# Patient Record
Sex: Female | Born: 2000 | Race: Black or African American | Hispanic: No | Marital: Single | Smoking: Never smoker
Health system: Southern US, Community
[De-identification: ages and names within clinical notes are randomized; demographics above are authoritative.]

## PROBLEM LIST (undated history)

## (undated) DIAGNOSIS — I509 Heart failure, unspecified: Secondary | ICD-10-CM

## (undated) DIAGNOSIS — E0590 Thyrotoxicosis, unspecified without thyrotoxic crisis or storm: Secondary | ICD-10-CM

## (undated) DIAGNOSIS — R011 Cardiac murmur, unspecified: Secondary | ICD-10-CM

## (undated) DIAGNOSIS — Z227 Latent tuberculosis: Secondary | ICD-10-CM

## (undated) DIAGNOSIS — L309 Dermatitis, unspecified: Secondary | ICD-10-CM

## (undated) HISTORY — DX: Thyrotoxicosis, unspecified without thyrotoxic crisis or storm: E05.90

## (undated) HISTORY — DX: Heart failure, unspecified: I50.9

## (undated) HISTORY — PX: NO PAST SURGERIES: SHX2092

---
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
## 2019-11-02 ENCOUNTER — Ambulatory Visit (HOSPITAL_COMMUNITY)
Admission: EM | Admit: 2019-11-02 | Discharge: 2019-11-02 | Disposition: A | Discharge status: Home / Self Care | Payer: Self-pay | Attending: Urgent Care | Admitting: Urgent Care

## 2019-11-02 ENCOUNTER — Other Ambulatory Visit: Payer: Self-pay

## 2019-11-02 ENCOUNTER — Inpatient Hospital Stay (HOSPITAL_COMMUNITY)
Admission: AD | Admit: 2019-11-02 | Discharge: 2019-11-02 | Disposition: A | Discharge status: Home / Self Care | Payer: Commercial Managed Care - PPO | Attending: Obstetrics and Gynecology | Admitting: Obstetrics and Gynecology

## 2019-11-02 ENCOUNTER — Encounter (HOSPITAL_COMMUNITY): Payer: Self-pay

## 2019-11-02 DIAGNOSIS — Z3202 Encounter for pregnancy test, result negative: Secondary | ICD-10-CM | POA: Insufficient documentation

## 2019-11-02 DIAGNOSIS — R102 Pelvic and perineal pain: Secondary | ICD-10-CM

## 2019-11-02 DIAGNOSIS — N939 Abnormal uterine and vaginal bleeding, unspecified: Secondary | ICD-10-CM | POA: Insufficient documentation

## 2019-11-02 LAB — POCT PREGNANCY, URINE: Preg Test, Ur: NEGATIVE

## 2019-11-02 MED ORDER — MEGESTROL ACETATE 40 MG PO TABS: 80.0000 mg | ORAL_TABLET | Freq: Every day | ORAL | 0 refills | Status: DC

## 2019-11-02 MED ORDER — NAPROXEN 500 MG PO TABS: 500.0000 mg | ORAL_TABLET | Freq: Two times a day (BID) | ORAL | 0 refills | Status: DC

## 2019-11-02 NOTE — MAU Provider Note (Addendum)
[name] is a 19 y.o. No obstetric history on file. female who presents to MAU today with complaint of bleeding and cramping. The patient does not think she is pregnant. She reports calling her OB/GYN office today and was told to come here.   O BP 139/65 (BP Location: Right Arm)   Pulse 86   Temp 98.6 F (37 C) (Oral)   Resp 16   Ht 5\' 2"  (1.575 m)   Wt 83.7 kg   LMP 10/08/2019 Comment: started  when she expected period  SpO2 100%   BMI 33.76 kg/m  Physical Exam  Nursing note and vitals reviewed. Constitutional: She is oriented to person, place, and time. She appears well-developed and well-nourished. No distress.  HENT:  Head: Normocephalic and atraumatic.  Cardiovascular: Normal rate.  Respiratory: Effort normal. No respiratory distress.  Musculoskeletal:        General: Normal range of motion.     Cervical back: Normal range of motion.  Neurological: She is alert and oriented to person, place, and time.  Psychiatric: She has a normal mood and affect.   A Non pregnant female Medical screening exam complete  Results for orders placed or performed during the hospital encounter of 11/02/19 (from the past 24 hour(s))  Pregnancy, urine POC     Status: None   Collection Time: 11/02/19  3:39 PM  Result Value Ref Range   Preg Test, Ur NEGATIVE NEGATIVE   P Discharge from MAU in stable condition Patient given the option of transfer to [facility] for further evaluation or seek care in outpatient facility of choice List of options for follow-up given  Warning signs for worsening condition that would warrant emergency follow-up discussed Patient may return to MAU as needed for pregnancy related complaints  Julianne Handler, CNM 11/02/2019 3:50 PM

## 2019-11-02 NOTE — ED Provider Notes (Signed)
[place]   MRN: [id number] DOB: 2001-09-22  Subjective:   Alexis Conley is a 19 y.o. female presenting for 1 month history of persistent vaginal bleeding.  Patient states that she has a gynecologist and has been working with them on figuring out the right kind of birth control for her.  They are also planning on doing a pelvic and transvaginal ultrasound.  However, patient was worried about her ongoing bleeding and was advised to come in to be seen today.  No current facility-administered medications for this encounter.  Current Outpatient Medications:  .  cephALEXin (KEFLEX) 500 MG capsule, Take 1 capsule (500 mg total) by mouth 4 (four) times daily., Disp: 20 capsule, Rfl: 0 .  EPINEPHrine (EPI-PEN) 0.3 mg/0.3 mL DEVI, Inject 0.3 mg into the muscle daily as needed., Disp: , Rfl:  .  ibuprofen (ADVIL,MOTRIN) 600 MG tablet, Take 1 tablet (600 mg total) every 6 (six) hours as needed by mouth., Disp: 30 tablet, Rfl: 0 .  predniSONE (DELTASONE) 20 MG tablet, Two daily with food, Disp: 6 tablet, Rfl: 0 .  triamcinolone cream (KENALOG) 0.1 %, Apply 1 application topically 2 (two) times daily., Disp: 80 g, Rfl: 4   Allergies  Allergen Reactions  . Peanut-Containing Drug Products Anaphylaxis  . Shellfish Allergy Anaphylaxis  . Tomato Hives  . Mushroom Extract Complex     Childhood allergy.... Does not know reaction.     History reviewed. No pertinent past medical history.   History reviewed. No pertinent surgical history.  Family History  Problem Relation Age of Onset  . Healthy Mother   . Healthy Father     Social History   Tobacco Use  . Smoking status: Never Smoker  . Smokeless tobacco: Never Used  Substance Use Topics  . Alcohol use: No  . Drug use: No    ROS   Objective:   Vitals: BP 120/69 (BP Location: Left Arm)   Pulse 71   Temp 98.4 F (36.9 C) (Oral)   Resp 16   LMP 10/08/2019 Comment: started  when she expected period  SpO2 100%    Physical Exam Constitutional:      General: She is not in acute distress.    Appearance: Normal appearance. She is well-developed. She is not ill-appearing, toxic-appearing or diaphoretic.  HENT:     Head: Normocephalic and atraumatic.     Nose: Nose normal.     Mouth/Throat:     Mouth: Mucous membranes are moist.  Eyes:     Extraocular Movements: Extraocular movements intact.     Pupils: Pupils are equal, round, and reactive to light.  Cardiovascular:     Rate and Rhythm: Normal rate and regular rhythm.     Pulses: Normal pulses.     Heart sounds: Normal heart sounds. No murmur. No friction rub. No gallop.   Pulmonary:     Effort: Pulmonary effort is normal. No respiratory distress.     Breath sounds: Normal breath sounds. No stridor. No wheezing, rhonchi or rales.  Abdominal:     General: Bowel sounds are normal. There is no distension.     Palpations: Abdomen is soft. There is no mass.     Tenderness: There is abdominal tenderness (mild throughout). There is no guarding or rebound.  Skin:    General: Skin is warm and dry.     Findings: No rash.  Neurological:     Mental Status: She is alert and oriented to person, place, and time.  Psychiatric:  Mood and Affect: Mood normal.        Behavior: Behavior normal.        Thought Content: Thought content normal.        Judgment: Judgment normal.     Results for orders placed or performed during the hospital encounter of 11/02/19 (from the past 24 hour(s))  Pregnancy, urine POC     Status: None   Collection Time: 11/02/19  3:39 PM  Result Value Ref Range   Preg Test, Ur NEGATIVE NEGATIVE    Assessment and Plan :   1. Abnormal uterine bleeding   2. Vaginal bleeding   3. Pelvic pain     Will use Megace to help with her ongoing bleeding.  Emphasized need to maintain follow-up with her gynecologist to continue working on starting contraception, get her full work-up as they planned. Counseled patient on potential for  adverse effects with medications prescribed/recommended today, ER and return-to-clinic precautions discussed, patient verbalized understanding.    Jaynee Eagles, [place] 11/02/19 1702

## 2019-11-02 NOTE — ED Triage Notes (Signed)
Patient presents to Urgent Care with complaints of vaginal bleeding since a month ago. Patient reports she is not on birth control, is supposed to be getting started on some. Pt states she went to [facility] hospital but because she had a negative pregnancy test was told to come here.

## 2019-11-02 NOTE — MAU Note (Signed)
Has been bleeding for a month, cramping, just feeling weak, tired and light headed. Pt states is not pregnant.  Had called office and was told to come here.

## 2020-02-17 ENCOUNTER — Institutional Professional Consult (permissible substitution): Payer: Self-pay | Admitting: Plastic Surgery

## 2020-03-30 ENCOUNTER — Emergency Department (HOSPITAL_COMMUNITY)
Admission: EM | Admit: 2020-03-30 | Discharge: 2020-03-31 | Disposition: A | Discharge status: Home / Self Care | Payer: 59 | Attending: Emergency Medicine | Admitting: Emergency Medicine

## 2020-03-30 ENCOUNTER — Encounter (HOSPITAL_COMMUNITY): Payer: Self-pay | Admitting: Emergency Medicine

## 2020-03-30 DIAGNOSIS — R531 Weakness: Secondary | ICD-10-CM | POA: Insufficient documentation

## 2020-03-30 DIAGNOSIS — Z91010 Allergy to peanuts: Secondary | ICD-10-CM | POA: Diagnosis not present

## 2020-03-30 DIAGNOSIS — N939 Abnormal uterine and vaginal bleeding, unspecified: Secondary | ICD-10-CM | POA: Insufficient documentation

## 2020-03-30 DIAGNOSIS — Z79899 Other long term (current) drug therapy: Secondary | ICD-10-CM | POA: Insufficient documentation

## 2020-03-30 DIAGNOSIS — N926 Irregular menstruation, unspecified: Secondary | ICD-10-CM | POA: Diagnosis not present

## 2020-03-30 DIAGNOSIS — R42 Dizziness and giddiness: Secondary | ICD-10-CM | POA: Diagnosis not present

## 2020-03-30 LAB — COMPREHENSIVE METABOLIC PANEL
ALT: 12 U/L (ref 0–44)
AST: 17 U/L (ref 15–41)
Albumin: 3.6 g/dL (ref 3.5–5.0)
Alkaline Phosphatase: 85 U/L (ref 38–126)
Anion gap: 9 (ref 5–15)
BUN: 17 mg/dL (ref 6–20)
CO2: 22 mmol/L (ref 22–32)
Calcium: 9.3 mg/dL (ref 8.9–10.3)
Chloride: 107 mmol/L (ref 98–111)
Creatinine, Ser: 0.76 mg/dL (ref 0.44–1.00)
GFR calc Af Amer: >60 mL/min (ref >60)
GFR calc non Af Amer: >60 mL/min (ref >60)
Glucose, Bld: 92 mg/dL (ref 70–99)
Potassium: 4 mmol/L (ref 3.5–5.1)
Sodium: 138 mmol/L (ref 135–145)
Total Bilirubin: 0.3 mg/dL (ref 0.3–1.2)
Total Protein: 6.9 g/dL (ref 6.5–8.1)

## 2020-03-30 LAB — CBC WITH DIFFERENTIAL/PLATELET
Abs Immature Granulocytes: 0.01 10*3/uL (ref 0.00–0.07)
Basophils Absolute: 0 10*3/uL (ref 0.0–0.1)
Basophils Relative: 1 %
Eosinophils Absolute: 0.2 10*3/uL (ref 0.0–0.5)
Eosinophils Relative: 2 %
HCT: 37.3 % (ref 36.0–46.0)
Hemoglobin: 11.2 g/dL — ABNORMAL LOW (ref 12.0–15.0)
Immature Granulocytes: 0 %
Lymphocytes Relative: 34 %
Lymphs Abs: 2.8 10*3/uL (ref 0.7–4.0)
MCH: 22.1 pg — ABNORMAL LOW (ref 26.0–34.0)
MCHC: 30 g/dL (ref 30.0–36.0)
MCV: 73.6 fL — ABNORMAL LOW (ref 80.0–100.0)
Monocytes Absolute: 0.6 10*3/uL (ref 0.1–1.0)
Monocytes Relative: 7 %
Neutro Abs: 4.5 10*3/uL (ref 1.7–7.7)
Neutrophils Relative %: 56 %
Platelets: 330 10*3/uL (ref 150–400)
RBC: 5.07 MIL/uL (ref 3.87–5.11)
RDW: 19.3 % — ABNORMAL HIGH (ref 11.5–15.5)
WBC: 8 10*3/uL (ref 4.0–10.5)
nRBC: 0 % (ref 0.0–0.2)

## 2020-03-30 LAB — SAMPLE TO BLOOD BANK

## 2020-03-30 LAB — I-STAT BETA HCG BLOOD, ED (MC, WL, AP ONLY): I-stat hCG, quantitative: <5 m[IU]/mL (ref <5)

## 2020-03-30 NOTE — ED Triage Notes (Signed)
Pt states she is been bleeding for the past 2 weeks no controlled. She is changing her pads every 3 hours.

## 2020-03-31 LAB — URINALYSIS, ROUTINE W REFLEX MICROSCOPIC

## 2020-03-31 LAB — URINALYSIS, MICROSCOPIC (REFLEX): RBC / HPF: >50 RBC/hpf (ref 0–5)

## 2020-03-31 MED ORDER — MEGESTROL ACETATE 40 MG PO TABS
120.0000 mg | ORAL_TABLET | Freq: Once | ORAL | Status: DC
  Filled 2020-03-31: qty 3

## 2020-03-31 MED ORDER — NORGESTIMATE-ETH ESTRADIOL 0.25-35 MG-MCG PO TABS: 3.0000 | ORAL_TABLET | Freq: Every day | ORAL | 0 refills | Status: DC

## 2020-03-31 MED ORDER — NORGESTIMATE-ETH ESTRADIOL 0.25-35 MG-MCG PO TABS: 1.0000 | ORAL_TABLET | Freq: Every day | ORAL | 11 refills | Status: DC

## 2020-03-31 NOTE — ED Notes (Signed)
Patient verbalizes understanding of discharge instructions. Opportunity for questioning and answers were provided. Armband removed by staff, pt discharged from ED ambulatory to home.  

## 2020-03-31 NOTE — ED Provider Notes (Signed)
[facility] EMERGENCY DEPARTMENT Provider Note   CSN: [id number] Arrival date & time: 03/30/20  1756     History Chief Complaint  Patient presents with  . Vaginal Bleeding    Alexis Conley is a 19 y.o. female.   Vaginal Bleeding Quality:  Bright red and clots Severity:  Moderate Onset quality:  Gradual Duration:  2 weeks Timing:  Constant Progression:  Unchanged Chronicity:  Recurrent Menstrual history:  Irregular Number of pads used:  Q3h Possible pregnancy: no   Context: not after intercourse   Relieved by:  None tried Worsened by:  Nothing Ineffective treatments:  None tried      History reviewed. No pertinent past medical history.  There are no problems to display for this patient.   History reviewed. No pertinent surgical history.   OB History   No obstetric history on file.     Family History  Problem Relation Age of Onset  . Healthy Mother   . Healthy Father     Social History   Tobacco Use  . Smoking status: Never Smoker  . Smokeless tobacco: Never Used  Vaping Use  . Vaping Use: Never used  Substance Use Topics  . Alcohol use: No  . Drug use: No    Home Medications Prior to Admission medications   Medication Sig Start Date End Date Taking? Authorizing Provider  EPINEPHrine (EPI-PEN) 0.3 mg/0.3 mL DEVI Inject 0.3 mg into the muscle daily as needed (for allergy).    Yes [provider]  megestrol (MEGACE) 40 MG tablet Take 2 tablets (80 mg total) by mouth daily. Patient not taking: Reported on 03/31/2020 11/02/19   Jaynee Eagles, PA-C  naproxen (NAPROSYN) 500 MG tablet Take 1 tablet (500 mg total) by mouth 2 (two) times daily with a meal. Patient not taking: Reported on 03/31/2020 11/02/19   Jaynee Eagles, PA-C  norgestimate-ethinyl estradiol ([place] 28) 0.25-35 MG-MCG tablet Take 3 tablets by mouth daily for 7 days. 03/31/20 04/07/20  Jonah Nestle, Corene Cornea, MD  norgestimate-ethinyl estradiol ([place] 28) 0.25-35 MG-MCG tablet  Take 1 tablet by mouth daily. 03/31/20   Gretel Cantu, Corene Cornea, MD    Allergies    Peanut-containing drug products, Shellfish allergy, Tomato, and Mushroom extract complex  Review of Systems   Review of Systems  Genitourinary: Positive for vaginal bleeding.  All other systems reviewed and are negative.   Physical Exam Updated Vital Signs BP (!) 144/77 (BP Location: Right Arm)   Pulse 90   Temp 98 F (36.7 C) (Oral)   Resp 16   Ht 5\' 2"  (1.575 m)   Wt 79.4 kg   SpO2 100%   BMI 32.01 kg/m   Physical Exam Vitals and nursing note reviewed.  Constitutional:      Appearance: She is well-developed.  HENT:     Head: Normocephalic and atraumatic.     Nose: No congestion or rhinorrhea.     Mouth/Throat:     Mouth: Mucous membranes are moist.     Pharynx: Oropharynx is clear.  Eyes:     Pupils: Pupils are equal, round, and reactive to light.  Cardiovascular:     Rate and Rhythm: Normal rate and regular rhythm.  Pulmonary:     Effort: No respiratory distress.     Breath sounds: No stridor.  Abdominal:     General: There is no distension.  Musculoskeletal:     Cervical back: Normal range of motion.  Skin:    General: Skin is warm and dry.  Neurological:     General: No focal deficit present.     Mental Status: She is alert and oriented to person, place, and time.     ED Results / Procedures / Treatments   Labs (all labs ordered are listed, but only abnormal results are displayed) Labs Reviewed  CBC WITH DIFFERENTIAL/PLATELET - Abnormal; Notable for the following components:      Result Value   Hemoglobin 11.2 (*)    MCV 73.6 (*)    MCH 22.1 (*)    RDW 19.3 (*)    All other components within normal limits  COMPREHENSIVE METABOLIC PANEL  URINALYSIS, ROUTINE W REFLEX MICROSCOPIC  I-STAT BETA HCG BLOOD, ED ([facility], [facility], [facility] ONLY)  SAMPLE TO BLOOD BANK    EKG None  Radiology No results found.  Procedures Procedures (including critical care time)  Medications Ordered in  ED Medications  megestrol (MEGACE) tablet 120 mg (has no administration in time range)    ED Course  I have reviewed the triage vital signs and the nursing notes.  Pertinent labs & imaging results that were available during my care of the patient were reviewed by me and considered in my medical decision making (see chart for details).    MDM Rules/Calculators/A&P                          History of abnormal uterine bleeding secondary to endometrial thickness.  Patient is scheduled to repeat ultrasound here in a month or 2 and follow-up with her gynecologist for the same however she had a little bit of lightheadedness and weakness with 2 weeks of bleeding so she was concerned that her hemoglobin might be low.  Hemoglobin is within normal limits.  Her vital signs are within normal limits.  Low suspicion for acute blood loss anemia.  After long discussion with the patient will defer ultrasound or pelvic exam at this time as we know the source of bleeding.  She is not sexually active.  We will try to control with oral medications again prior to follow-up with gynecologist per patient request.  Final Clinical Impression(s) / ED Diagnoses Final diagnoses:  Vaginal bleeding    Rx / DC Orders ED Discharge Orders         Ordered    norgestimate-ethinyl estradiol ([place] 28) 0.25-35 MG-MCG tablet  Daily     Discontinue  Reprint     03/31/20 0254    norgestimate-ethinyl estradiol ([place] 28) 0.25-35 MG-MCG tablet  Daily     Discontinue  Reprint     03/31/20 0255           Adraine Biffle, Corene Cornea, MD 03/31/20 0973

## 2020-03-31 NOTE — Discharge Instructions (Signed)
Take 3 tabs of sprintec daily, take one week off then take as directed on package

## 2020-05-02 ENCOUNTER — Other Ambulatory Visit: Payer: Self-pay | Admitting: Obstetrics and Gynecology

## 2020-05-02 ENCOUNTER — Ambulatory Visit
Admission: RE | Admit: 2020-05-02 | Discharge: 2020-05-02 | Disposition: A | Discharge status: Home / Self Care | Payer: No Typology Code available for payment source | Source: Ambulatory Visit | Attending: Obstetrics and Gynecology | Admitting: Obstetrics and Gynecology

## 2020-05-02 DIAGNOSIS — R7611 Nonspecific reaction to tuberculin skin test without active tuberculosis: Secondary | ICD-10-CM

## 2020-06-14 ENCOUNTER — Ambulatory Visit (HOSPITAL_COMMUNITY)
Admission: EM | Admit: 2020-06-14 | Discharge: 2020-06-14 | Disposition: A | Discharge status: Home / Self Care | Payer: 59 | Attending: Emergency Medicine | Admitting: Emergency Medicine

## 2020-06-14 ENCOUNTER — Other Ambulatory Visit: Payer: Self-pay

## 2020-06-14 ENCOUNTER — Encounter (HOSPITAL_COMMUNITY): Payer: Self-pay | Admitting: *Deleted

## 2020-06-14 DIAGNOSIS — R05 Cough: Secondary | ICD-10-CM | POA: Diagnosis present

## 2020-06-14 DIAGNOSIS — R059 Cough, unspecified: Secondary | ICD-10-CM

## 2020-06-14 DIAGNOSIS — R197 Diarrhea, unspecified: Secondary | ICD-10-CM | POA: Diagnosis present

## 2020-06-14 DIAGNOSIS — Z79899 Other long term (current) drug therapy: Secondary | ICD-10-CM | POA: Diagnosis not present

## 2020-06-14 DIAGNOSIS — Z20822 Contact with and (suspected) exposure to covid-19: Secondary | ICD-10-CM | POA: Diagnosis not present

## 2020-06-14 HISTORY — DX: Latent tuberculosis: Z22.7

## 2020-06-14 LAB — SARS CORONAVIRUS 2 (TAT 6-24 HRS): SARS Coronavirus 2: NEGATIVE

## 2020-06-14 MED ORDER — ONDANSETRON 4 MG PO TBDP
4.0000 mg | ORAL_TABLET | Freq: Three times a day (TID) | ORAL | 0 refills | Status: DC | PRN
Start: 2020-06-14 — End: 2020-08-11

## 2020-06-14 MED ORDER — BENZONATATE 100 MG PO CAPS
100.0000 mg | ORAL_CAPSULE | Freq: Three times a day (TID) | ORAL | 0 refills | Status: DC
Start: 2020-06-14 — End: 2020-08-11

## 2020-06-14 NOTE — Discharge Instructions (Signed)
Small frequent sips of fluids- Pedialyte, Gatorade, water, broth- to maintain hydration.   Zofran every 8 hours as needed for nausea or vomiting.   Bland diet as tolerated.  Tessalon as needed for cough.  Self isolate until covid results are back and negative.  Will notify you by phone of any positive findings. Your negative results will be sent through your MyChart.     If symptoms worsen or do not improve in the next week to return to be seen or to follow up with your PCP.

## 2020-06-14 NOTE — ED Triage Notes (Signed)
Patient works at [employer] and states that many children have been getting RSV and croup.

## 2020-06-14 NOTE — ED Triage Notes (Signed)
Patient in with complaints of cough started today and nausea and diarrhea since Friday. Patient states that she has experienced 3 episodes of diarrhea/day. Patient denies abdominal pain and fever. Patient states she has experienced chills. Patient also mentioned that she is being treated for latent TB currently.

## 2020-06-14 NOTE — ED Provider Notes (Signed)
[facility]    CSN: [id number] Arrival date & time: 06/14/20  1010      History   Chief Complaint Chief Complaint  Patient presents with  . Cough  . Diarrhea    HPI Alexis Conley is a 19 y.o. female.   Alexis Conley presents with complaints of cough. Cough causes chest pain . Diarrhea for the past 5 days. Nausea, no vomiting. Cough started today. No fever. No runny nose. Some sore throat. No headache or body aches. No abdominal pain. Approximately 3 episodes of diarrhea a day, took over the counter medication for diarrhea yesterday which has helped, no further episodes today. Works in child care. Her nephew and her mother had a "stomach virus" three weeks ago. There has been RSV and croup where she works. No history of covid-19 and has not received vaccination. 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 She is on rifampin, started this yesterday. No shortness of breath.     ROS per HPI, negative if not otherwise mentioned.      Past Medical History:  Diagnosis Date  . TB lung, latent     There are no problems to display for this patient.   History reviewed. No pertinent surgical history.  OB History   No obstetric history on file.      Home Medications    Prior to Admission medications   Medication Sig Start Date End Date Taking? Authorizing Provider  rifampin (RIFADIN) 300 MG capsule Take 600 mg by mouth.   Yes [provider]  benzonatate (TESSALON) 100 MG capsule Take 1 capsule (100 mg total) by mouth every 8 (eight) hours. 06/14/20   Zigmund Gottron, NP  EPINEPHrine (EPI-PEN) 0.3 mg/0.3 mL DEVI Inject 0.3 mg into the muscle daily as needed (for allergy).     [provider]  megestrol (MEGACE) 40 MG tablet Take 2 tablets (80 mg total) by mouth daily. Patient not taking: Reported on 03/31/2020 11/02/19   Jaynee Eagles, PA-C  naproxen (NAPROSYN) 500 MG tablet Take 1 tablet (500 mg total) by mouth  2 (two) times daily with a meal. Patient not taking: Reported on 03/31/2020 11/02/19   Jaynee Eagles, PA-C  norgestimate-ethinyl estradiol ([place] 28) 0.25-35 MG-MCG tablet Take 3 tablets by mouth daily for 7 days. 03/31/20 04/07/20  Mesner, Corene Cornea, MD  norgestimate-ethinyl estradiol ([place] 28) 0.25-35 MG-MCG tablet Take 1 tablet by mouth daily. 03/31/20   Mesner, Corene Cornea, MD  ondansetron (ZOFRAN-ODT) 4 MG disintegrating tablet Take 1 tablet (4 mg total) by mouth every 8 (eight) hours as needed for nausea or vomiting. 06/14/20   Zigmund Gottron, NP    Family History Family History  Problem Relation Age of Onset  . Healthy Mother   . Healthy Father     Social History Social History   Tobacco Use  . Smoking status: Never Smoker  . Smokeless tobacco: Never Used  Vaping Use  . Vaping Use: Never used  Substance Use Topics  . Alcohol use: No  . Drug use: No     Allergies   Peanut-containing drug products, Shellfish allergy, Tomato, and Mushroom extract complex   Review of Systems Review of Systems   Physical Exam Triage Vital Signs ED Triage Vitals  Enc Vitals Group     BP 06/14/20 1158 (!) 121/59     Pulse Rate 06/14/20 1158 76     Resp 06/14/20 1158 20     Temp  06/14/20 1158 98.8 F (37.1 C)     Temp Source 06/14/20 1158 Oral     SpO2 06/14/20 1158 100 %     Weight --      Height --      Head Circumference --      Peak Flow --      Pain Score 06/14/20 1201 0     Pain Loc --      Pain Edu? --      Excl. in [place]? --    No data found.  Updated Vital Signs BP (!) 121/59 (BP Location: Left Arm)   Pulse 76   Temp 98.8 F (37.1 C) (Oral)   Resp 20   LMP 06/09/2020   SpO2 100%   Visual Acuity Right Eye Distance:   Left Eye Distance:   Bilateral Distance:    Right Eye Near:   Left Eye Near:    Bilateral Near:     Physical Exam Constitutional:      General: She is not in acute distress.    Appearance: She is well-developed.  Cardiovascular:     Rate and Rhythm:  Normal rate.  Pulmonary:     Effort: Pulmonary effort is normal.  Abdominal:     Tenderness: There is no abdominal tenderness.  Skin:    General: Skin is warm and dry.  Neurological:     Mental Status: She is alert and oriented to person, place, and time.      UC Treatments / Results  Labs (all labs ordered are listed, but only abnormal results are displayed) Labs Reviewed  SARS CORONAVIRUS 2 (TAT 6-24 HRS)    EKG   Radiology No results found.  Procedures Procedures (including critical care time)  Medications Ordered in UC Medications - No data to display  Initial Impression / Assessment and Plan / UC Course  I have reviewed the triage vital signs and the nursing notes.  Pertinent labs & imaging results that were available during my care of the patient were reviewed by me and considered in my medical decision making (see chart for details).     Non toxic. Benign physical exam.  No work of breathing. One day of cough. Chest xray deferred today. Covid testing pending and isolation instructions provided.  Supportive cares recommended. Diarrhea improving, taking po. Return precautions provided. Patient verbalized understanding and agreeable to plan.   Final Clinical Impressions(s) / UC Diagnoses   Final diagnoses:  Cough  Diarrhea, unspecified type     Discharge Instructions     Small frequent sips of fluids- Pedialyte, Gatorade, water, broth- to maintain hydration.   Zofran every 8 hours as needed for nausea or vomiting.   Bland diet as tolerated.  Tessalon as needed for cough.  Self isolate until covid results are back and negative.  Will notify you by phone of any positive findings. Your negative results will be sent through your MyChart.     If symptoms worsen or do not improve in the next week to return to be seen or to follow up with your PCP.      ED Prescriptions    Medication Sig Dispense Auth. Provider   benzonatate (TESSALON) 100 MG capsule Take 1  capsule (100 mg total) by mouth every 8 (eight) hours. 21 capsule Jisella Ashenfelter B, NP   ondansetron (ZOFRAN-ODT) 4 MG disintegrating tablet Take 1 tablet (4 mg total) by mouth every 8 (eight) hours as needed for nausea or vomiting. 12 tablet Augusto Gamble B,  NP     PDMP not reviewed this encounter.   Zigmund Gottron, NP 06/14/20 1936

## 2020-08-11 ENCOUNTER — Ambulatory Visit
Admission: EM | Admit: 2020-08-11 | Discharge: 2020-08-11 | Disposition: A | Discharge status: Home / Self Care | Payer: 59 | Attending: Physician Assistant | Admitting: Physician Assistant

## 2020-08-11 DIAGNOSIS — J029 Acute pharyngitis, unspecified: Secondary | ICD-10-CM

## 2020-08-11 DIAGNOSIS — R059 Cough, unspecified: Secondary | ICD-10-CM | POA: Diagnosis not present

## 2020-08-11 DIAGNOSIS — M940 Chondrocostal junction syndrome [Tietze]: Secondary | ICD-10-CM

## 2020-08-11 MED ORDER — BENZONATATE 200 MG PO CAPS
200.0000 mg | ORAL_CAPSULE | Freq: Three times a day (TID) | ORAL | 0 refills | Status: DC
Start: 2020-08-11 — End: 2020-10-05

## 2020-08-11 NOTE — Discharge Instructions (Signed)
COVID PCR testing ordered. I would like you to quarantine until testing results. Tessalon for cough. You can take over the counter flonase/nasacort to help with nasal congestion/drainage. Tylenol/motrin for pain and fever. Keep hydrated, urine should be clear to pale yellow in color. If experiencing shortness of breath, trouble breathing, go to the emergency department for further evaluation needed.

## 2020-08-11 NOTE — ED Provider Notes (Signed)
[facility]    CSN: [id number] Arrival date & time: 08/11/20  1519      History   Chief Complaint Chief Complaint  Patient presents with  . Cough    HPI Alexis Conley is a 19 y.o. female.   19 year old female comes in for 2 day of URI symptoms. Cough, sore throat, congestion. Now with central chest pain. Denies fever, chills, body aches. Denies abdominal pain, nausea, vomiting, diarrhea. Denies shortness of breath, loss of taste/smell.   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 Started on rifampin, but self discontinued due to side effects. Denies night sweats, weight loss, hemoptysis.      Past Medical History:  Diagnosis Date  . TB lung, latent     There are no problems to display for this patient.   History reviewed. No pertinent surgical history.  OB History   No obstetric history on file.      Home Medications    Prior to Admission medications   Medication Sig Start Date End Date Taking? Authorizing Provider  rifampin (RIFADIN) 300 MG capsule Take 600 mg by mouth.   Yes [provider]  benzonatate (TESSALON) 200 MG capsule Take 1 capsule (200 mg total) by mouth every 8 (eight) hours. 08/11/20   Tasia Catchings, Luisfernando Brightwell V, PA-C  EPINEPHrine (EPI-PEN) 0.3 mg/0.3 mL DEVI Inject 0.3 mg into the muscle daily as needed (for allergy).     [provider]  norgestimate-ethinyl estradiol ([place] 28) 0.25-35 MG-MCG tablet Take 1 tablet by mouth daily. 03/31/20 08/11/20  Mesner, Corene Cornea, MD    Family History Family History  Problem Relation Age of Onset  . Healthy Mother   . Healthy Father     Social History Social History   Tobacco Use  . Smoking status: Never Smoker  . Smokeless tobacco: Never Used  Vaping Use  . Vaping Use: Never used  Substance Use Topics  . Alcohol use: No  . Drug use: No     Allergies   Peanut-containing drug products, Shellfish allergy, Tomato, and Mushroom extract complex   Review of Systems Review of Systems    Reason unable to perform ROS: See HPI as above.     Physical Exam Triage Vital Signs ED Triage Vitals  Enc Vitals Group     BP 08/11/20 1526 120/82     Pulse Rate 08/11/20 1526 86     Resp 08/11/20 1526 20     Temp 08/11/20 1526 98.4 F (36.9 C)     Temp Source 08/11/20 1526 Oral     SpO2 08/11/20 1526 99 %     Weight --      Height --      Head Circumference --      Peak Flow --      Pain Score 08/11/20 1533 4     Pain Loc --      Pain Edu? --      Excl. in [place]? --    No data found.  Updated Vital Signs BP 120/82 (BP Location: Left Arm)   Pulse 86   Temp 98.4 F (36.9 C) (Oral)   Resp 20   LMP 07/07/2020   SpO2 99%   Physical Exam Constitutional:      General: She is not in acute distress.    Appearance: Normal appearance. She is well-developed. She is not ill-appearing, toxic-appearing or diaphoretic.  HENT:     Head: Normocephalic and atraumatic.     Right Ear: Tympanic membrane,  ear canal and external ear normal. Tympanic membrane is not erythematous or bulging.     Left Ear: Tympanic membrane, ear canal and external ear normal. Tympanic membrane is not erythematous or bulging.     Nose:     Right Sinus: No maxillary sinus tenderness or frontal sinus tenderness.     Left Sinus: No maxillary sinus tenderness or frontal sinus tenderness.     Mouth/Throat:     Mouth: Mucous membranes are moist.     Pharynx: Oropharynx is clear. Uvula midline.  Eyes:     Conjunctiva/sclera: Conjunctivae normal.     Pupils: Pupils are equal, round, and reactive to light.  Cardiovascular:     Rate and Rhythm: Normal rate and regular rhythm.  Pulmonary:     Effort: Pulmonary effort is normal. No accessory muscle usage, prolonged expiration, respiratory distress or retractions.     Breath sounds: No decreased air movement or transmitted upper airway sounds. No decreased breath sounds.     Comments: LCTAB Chest:     Comments: Chest wall tenderness to sternum  Musculoskeletal:      Cervical back: Normal range of motion and neck supple.  Skin:    General: Skin is warm and dry.  Neurological:     Mental Status: She is alert and oriented to person, place, and time.      UC Treatments / Results  Labs (all labs ordered are listed, but only abnormal results are displayed) Labs Reviewed  NOVEL CORONAVIRUS, NAA    EKG   Radiology No results found.  Procedures Procedures (including critical care time)  Medications Ordered in UC Medications - No data to display  Initial Impression / Assessment and Plan / UC Course  I have reviewed the triage vital signs and the nursing notes.  Pertinent labs & imaging results that were available during my care of the patient were reviewed by me and considered in my medical decision making (see chart for details).    COVID PCR test ordered. Patient to quarantine until testing results return. No alarming signs on exam. LCTAB. Symptomatic treatment discussed.  Push fluids.  Return precautions given.  Patient expresses understanding and agrees to plan.  Final Clinical Impressions(s) / UC Diagnoses   Final diagnoses:  Cough  Costochondritis  Sore throat   ED Prescriptions    Medication Sig Dispense Auth. Provider   benzonatate (TESSALON) 200 MG capsule Take 1 capsule (200 mg total) by mouth every 8 (eight) hours. 21 capsule Ok Edwards, PA-C     PDMP not reviewed this encounter.   Ok Edwards, PA-C 08/11/20 1554

## 2020-08-11 NOTE — ED Triage Notes (Signed)
Pt c/o cough, sore throat, and chest congestion x2 days. States hx of latent TB and has not been taking her meds for a month.

## 2020-08-12 LAB — NOVEL CORONAVIRUS, NAA: SARS-CoV-2, NAA: NOT DETECTED

## 2020-08-12 LAB — SARS-COV-2, NAA 2 DAY TAT

## 2020-10-05 ENCOUNTER — Other Ambulatory Visit: Payer: Self-pay

## 2020-10-05 ENCOUNTER — Ambulatory Visit
Admission: EM | Admit: 2020-10-05 | Discharge: 2020-10-05 | Disposition: A | Discharge status: Home / Self Care | Payer: 59 | Attending: Emergency Medicine | Admitting: Emergency Medicine

## 2020-10-05 DIAGNOSIS — R112 Nausea with vomiting, unspecified: Secondary | ICD-10-CM | POA: Diagnosis not present

## 2020-10-05 DIAGNOSIS — B349 Viral infection, unspecified: Secondary | ICD-10-CM

## 2020-10-05 DIAGNOSIS — R52 Pain, unspecified: Secondary | ICD-10-CM | POA: Diagnosis not present

## 2020-10-05 DIAGNOSIS — Z1152 Encounter for screening for COVID-19: Secondary | ICD-10-CM | POA: Diagnosis not present

## 2020-10-05 LAB — POCT URINALYSIS DIP (MANUAL ENTRY)
Bilirubin, UA: NEGATIVE
Blood, UA: NEGATIVE
Glucose, UA: NEGATIVE mg/dL
Nitrite, UA: NEGATIVE
Protein Ur, POC: 30 mg/dL — AB
Spec Grav, UA: >=1.03 — AB (ref 1.010–1.025)
Urobilinogen, UA: 0.2 E.U./dL
pH, UA: 5.5 (ref 5.0–8.0)

## 2020-10-05 LAB — POCT URINE PREGNANCY: Preg Test, Ur: NEGATIVE

## 2020-10-05 MED ORDER — IBUPROFEN 800 MG PO TABS: 800.0000 mg | ORAL_TABLET | Freq: Three times a day (TID) | ORAL | 0 refills | Status: DC

## 2020-10-05 MED ORDER — ONDANSETRON 4 MG PO TBDP
4.0000 mg | ORAL_TABLET | Freq: Three times a day (TID) | ORAL | 0 refills | Status: DC | PRN
Start: 2020-10-05 — End: 2021-02-22

## 2020-10-05 MED ORDER — FLUTICASONE PROPIONATE 50 MCG/ACT NA SUSP: 1.0000 | Freq: Every day | NASAL | 0 refills | Status: DC

## 2020-10-05 NOTE — ED Triage Notes (Signed)
Patient states she awoke at 0100 hours this am and was having generalized body aches and chills as well as nausea but has not has any emesis episodes. Pt states she also developed a headache this morning. Pt is aox4 and ambulatory.

## 2020-10-05 NOTE — Discharge Instructions (Addendum)
Pregnancy test negative COVID test pending Zofran as needed for nausea Tylenol ibuprofen for fever body aches chills, ear pain Flonase daily to help with any fluid on ears Rest and fluids Follow-up with your improving or worsening

## 2020-10-05 NOTE — ED Provider Notes (Signed)
[facility]    CSN: [id number] Arrival date & time: 10/05/20  1855      History   Chief Complaint Chief Complaint  Patient presents with  . Generalized Body Aches    Since 0100  . Nausea    Since 0100  . Headache    Today    HPI Alexis Conley is a 20 y.o. female presenting today for evaluation of ear pain and body aches.  Patient reports over the the past 4 days has had intermittent discomfort in her right ear.  Will have short brief sharp pains.  Denies any significant associated URI symptoms of cough congestion or sore throat.  Over the past 24 hours has developed low-grade fevers body aches and chills with associated nausea.  Denies vomiting.  Has urge to defecate, but has not been able to.  Denies any urinary symptoms.  Reports last menstrual cycle was early December, typically will have irregular cycles.  Reports questionable pregnancy test at home.  Denies any significant abdominal pain.  Reports stomach bug going around work.  HPI  Past Medical History:  Diagnosis Date  . TB lung, latent     There are no problems to display for this patient.   History reviewed. No pertinent surgical history.  OB History   No obstetric history on file.      Home Medications    Prior to Admission medications   Medication Sig Start Date End Date Taking? Authorizing Provider  fluticasone (FLONASE) 50 MCG/ACT nasal spray Place 1-2 sprays into both nostrils daily. 10/05/20  Yes Wieters, Hallie C, PA-C  ibuprofen (ADVIL) 800 MG tablet Take 1 tablet (800 mg total) by mouth 3 (three) times daily. 10/05/20  Yes Wieters, Hallie C, PA-C  ondansetron (ZOFRAN ODT) 4 MG disintegrating tablet Take 1 tablet (4 mg total) by mouth every 8 (eight) hours as needed for nausea or vomiting. 10/05/20  Yes Wieters, Hallie C, PA-C  EPINEPHrine (EPI-PEN) 0.3 mg/0.3 mL DEVI Inject 0.3 mg into the muscle daily as needed (for allergy).     [provider]  rifampin (RIFADIN) 300 MG  capsule Take 600 mg by mouth.    [provider]  norgestimate-ethinyl estradiol (Flowella 28) 0.25-35 MG-MCG tablet Take 1 tablet by mouth daily. 03/31/20 08/11/20  Mesner, Corene Cornea, MD    Family History Family History  Problem Relation Age of Onset  . Healthy Mother   . Healthy Father     Social History Social History   Tobacco Use  . Smoking status: Never Smoker  . Smokeless tobacco: Never Used  Vaping Use  . Vaping Use: Never used  Substance Use Topics  . Alcohol use: No  . Drug use: No     Allergies   Peanut-containing drug products, Shellfish allergy, Tomato, and Mushroom extract complex   Review of Systems Review of Systems  Constitutional: Positive for chills, fatigue and fever. Negative for activity change and appetite change.  HENT: Negative for congestion, ear pain, rhinorrhea, sinus pressure, sore throat and trouble swallowing.   Eyes: Negative for discharge and redness.  Respiratory: Negative for cough, chest tightness and shortness of breath.   Cardiovascular: Negative for chest pain.  Gastrointestinal: Positive for nausea. Negative for abdominal pain, diarrhea and vomiting.  Musculoskeletal: Positive for myalgias.  Skin: Negative for rash.  Neurological: Negative for dizziness, light-headedness and headaches.     Physical Exam Triage Vital Signs ED Triage Vitals  Enc Vitals Group     BP 10/05/20 1914 116/79  Pulse Rate 10/05/20 1914 (!) 105     Resp 10/05/20 1914 20     Temp 10/05/20 1914 99.7 F (37.6 C)     Temp Source 10/05/20 1914 Oral     SpO2 10/05/20 1914 99 %     Weight --      Height --      Head Circumference --      Peak Flow --      Pain Score 10/05/20 1929 8     Pain Loc --      Pain Edu? --      Excl. in [place]? --    No data found.  Updated Vital Signs BP 116/79 (BP Location: Left Arm)   Pulse (!) 105   Temp 99.7 F (37.6 C) (Oral)   Resp 20   LMP 08/18/2020   SpO2 99%   Visual Acuity Right Eye Distance:    Left Eye Distance:   Bilateral Distance:    Right Eye Near:   Left Eye Near:    Bilateral Near:     Physical Exam Vitals and nursing note reviewed.  Constitutional:      Appearance: She is well-developed and well-nourished.     Comments: No acute distress  HENT:     Head: Normocephalic and atraumatic.     Ears:     Comments: Bilateral ears without tenderness to palpation of external auricle, tragus and mastoid, EAC's without erythema or swelling, TM's with good bony landmarks and cone of light. Non erythematous.     Nose: Nose normal.     Mouth/Throat:     Comments: Oral mucosa pink and moist, no tonsillar enlargement or exudate. Posterior pharynx patent and nonerythematous, no uvula deviation or swelling. Normal phonation. Eyes:     Conjunctiva/sclera: Conjunctivae normal.  Cardiovascular:     Rate and Rhythm: Normal rate.  Pulmonary:     Effort: Pulmonary effort is normal. No respiratory distress.     Comments: Breathing comfortably at rest, CTABL, no wheezing, rales or other adventitious sounds auscultated Abdominal:     General: There is no distension.  Musculoskeletal:        General: Normal range of motion.     Cervical back: Neck supple.  Skin:    General: Skin is warm and dry.  Neurological:     Mental Status: She is alert and oriented to person, place, and time.  Psychiatric:        Mood and Affect: Mood and affect normal.      UC Treatments / Results  Labs (all labs ordered are listed, but only abnormal results are displayed) Labs Reviewed  POCT URINALYSIS DIP (MANUAL ENTRY) - Abnormal; Notable for the following components:      Result Value   Clarity, UA hazy (*)    Ketones, POC UA small (15) (*)    Spec Grav, UA >=1.030 (*)    Protein Ur, POC =30 (*)    Leukocytes, UA Trace (*)    All other components within normal limits  NOVEL CORONAVIRUS, NAA  POCT URINE PREGNANCY    EKG   Radiology No results found.  Procedures Procedures (including  critical care time)  Medications Ordered in UC Medications - No data to display  Initial Impression / Assessment and Plan / UC Course  I have reviewed the triage vital signs and the nursing notes.  Pertinent labs & imaging results that were available during my care of the patient were reviewed by me and considered in my  medical decision making (see chart for details).     Pregnancy test negative, COVID test pending, treating for viral etiology of nausea chills and body aches, no signs of ear infection, possible underlying eustachian tube dysfunction..  Continue symptomatic and supportive care rest and fluids, monitor symptoms.  Discussed strict return precautions. Patient verbalized understanding and is agreeable with plan.  Final Clinical Impressions(s) / UC Diagnoses   Final diagnoses:  Encounter for screening for COVID-19  Viral illness  Non-intractable vomiting with nausea, unspecified vomiting type  Body aches     Discharge Instructions     Pregnancy test negative COVID test pending Zofran as needed for nausea Tylenol ibuprofen for fever body aches chills, ear pain Flonase daily to help with any fluid on ears Rest and fluids Follow-up with your improving or worsening    ED Prescriptions    Medication Sig Dispense Auth. Provider   ondansetron (ZOFRAN ODT) 4 MG disintegrating tablet Take 1 tablet (4 mg total) by mouth every 8 (eight) hours as needed for nausea or vomiting. 20 tablet Wieters, Hallie C, PA-C   ibuprofen (ADVIL) 800 MG tablet Take 1 tablet (800 mg total) by mouth 3 (three) times daily. 21 tablet Wieters, Hallie C, PA-C   fluticasone (FLONASE) 50 MCG/ACT nasal spray Place 1-2 sprays into both nostrils daily. 16 g Wieters, [place]ima C, PA-C     PDMP not reviewed this encounter.   Janith Lima, [place] 10/05/20 2024

## 2020-10-05 NOTE — ED Triage Notes (Signed)
Pt also complains of pain in her right ear intermittently that began on Monday.

## 2020-10-10 LAB — NOVEL CORONAVIRUS, NAA: SARS-CoV-2, NAA: NOT DETECTED

## 2021-02-22 ENCOUNTER — Other Ambulatory Visit: Payer: Self-pay

## 2021-02-22 ENCOUNTER — Ambulatory Visit
Admission: EM | Admit: 2021-02-22 | Discharge: 2021-02-22 | Disposition: A | Discharge status: Home / Self Care | Payer: 59 | Attending: Emergency Medicine | Admitting: Emergency Medicine

## 2021-02-22 DIAGNOSIS — J029 Acute pharyngitis, unspecified: Secondary | ICD-10-CM | POA: Diagnosis present

## 2021-02-22 DIAGNOSIS — R197 Diarrhea, unspecified: Secondary | ICD-10-CM | POA: Insufficient documentation

## 2021-02-22 DIAGNOSIS — R110 Nausea: Secondary | ICD-10-CM | POA: Diagnosis not present

## 2021-02-22 DIAGNOSIS — R509 Fever, unspecified: Secondary | ICD-10-CM

## 2021-02-22 LAB — POCT RAPID STREP A (OFFICE): Rapid Strep A Screen: NEGATIVE

## 2021-02-22 MED ORDER — IBUPROFEN 800 MG PO TABS
800.0000 mg | ORAL_TABLET | Freq: Three times a day (TID) | ORAL | 0 refills | Status: DC
Start: 2021-02-22 — End: 2021-05-16

## 2021-02-22 MED ORDER — ONDANSETRON 4 MG PO TBDP
4.0000 mg | ORAL_TABLET | Freq: Three times a day (TID) | ORAL | 0 refills | Status: DC | PRN
Start: 2021-02-22 — End: 2021-05-16

## 2021-02-22 NOTE — Discharge Instructions (Addendum)
Strep test negative COVID test pending Rest and fluids Tylenol and ibuprofen for sore throat, fevers Zofran dissolved in mouth as needed for nausea Drink plenty of fluids May use over-the-counter Imodium/Pepto-Bismol as needed for diarrhea Follow-up if not improving or worsening

## 2021-02-22 NOTE — ED Provider Notes (Addendum)
[facility]    CSN: [id number] Arrival date & time: 02/22/21  1628      History   Chief Complaint Chief Complaint  Patient presents with  . Fever  . Sore Throat  . Cough  . Headache    HPI Alexis Conley is a 20 y.o. female history of latent TB presenting today for evaluation of fever, nausea, vomiting, diarrhea, abdominal pain and sore throat.  Reports that over the past 3 to 4 days has had GI upset with nausea and poor appetite.  Has had frequent bowel movements.  Over the past 1 to 2 days has developed slight sore throat and cough.  Sore throat mainly on right side.  Reports fevers up to 101.4.  Denies taking any Tylenol or ibuprofen today.  Last menstrual cycle around 05/09, denies possibility of pregnancy.  HPI  Past Medical History:  Diagnosis Date  . TB lung, latent     There are no problems to display for this patient.   History reviewed. No pertinent surgical history.  OB History   No obstetric history on file.      Home Medications    Prior to Admission medications   Medication Sig Start Date End Date Taking? Authorizing Provider  ibuprofen (ADVIL) 800 MG tablet Take 1 tablet (800 mg total) by mouth 3 (three) times daily. 02/22/21  Yes Alberta Cairns C, PA-C  ondansetron (ZOFRAN ODT) 4 MG disintegrating tablet Take 1 tablet (4 mg total) by mouth every 8 (eight) hours as needed for nausea or vomiting. 02/22/21  Yes Cecely Rengel C, PA-C  EPINEPHrine (EPI-PEN) 0.3 mg/0.3 mL DEVI Inject 0.3 mg into the muscle daily as needed (for allergy).     [provider]  rifampin (RIFADIN) 300 MG capsule Take 600 mg by mouth.    [provider]  fluticasone (FLONASE) 50 MCG/ACT nasal spray Place 1-2 sprays into both nostrils daily. 10/05/20 02/22/21  Amadeo Coke C, PA-C  norgestimate-ethinyl estradiol (SPRINTEC 28) 0.25-35 MG-MCG tablet Take 1 tablet by mouth daily. 03/31/20 08/11/20  Mesner, Corene Cornea, MD    Family History Family History   Problem Relation Age of Onset  . Healthy Mother   . Healthy Father     Social History Social History   Tobacco Use  . Smoking status: Never Smoker  . Smokeless tobacco: Never Used  Vaping Use  . Vaping Use: Never used  Substance Use Topics  . Alcohol use: No  . Drug use: No     Allergies   Peanut-containing drug products, Shellfish allergy, Tomato, and Mushroom extract complex   Review of Systems Review of Systems  Constitutional: Positive for appetite change, fatigue and fever. Negative for activity change and chills.  HENT: Positive for congestion, rhinorrhea and sore throat. Negative for ear pain, sinus pressure and trouble swallowing.   Eyes: Negative for discharge and redness.  Respiratory: Positive for cough. Negative for chest tightness and shortness of breath.   Cardiovascular: Negative for chest pain.  Gastrointestinal: Positive for abdominal pain, diarrhea and nausea. Negative for vomiting.  Musculoskeletal: Negative for myalgias.  Skin: Negative for rash.  Neurological: Negative for dizziness, light-headedness and headaches.     Physical Exam Triage Vital Signs ED Triage Vitals  Enc Vitals Group     BP 02/22/21 1810 115/70     Pulse Rate 02/22/21 1810 83     Resp 02/22/21 1810 16     Temp 02/22/21 1810 97.8 F (36.6 C)     Temp Source  02/22/21 1810 Oral     SpO2 02/22/21 1810 98 %     Weight --      Height --      Head Circumference --      Peak Flow --      Pain Score 02/22/21 1807 7     Pain Loc --      Pain Edu? --      Excl. in [place]? --    No data found.  Updated Vital Signs BP 115/70 (BP Location: Left Arm)   Pulse 83   Temp 97.8 F (36.6 C) (Oral)   Resp 16   LMP 01/29/2021   SpO2 98%   Visual Acuity Right Eye Distance:   Left Eye Distance:   Bilateral Distance:    Right Eye Near:   Left Eye Near:    Bilateral Near:     Physical Exam Vitals and nursing note reviewed.  Constitutional:      Appearance: She is  well-developed.     Comments: No acute distress  HENT:     Head: Normocephalic and atraumatic.     Ears:     Comments: Bilateral ears without tenderness to palpation of external auricle, tragus and mastoid, EAC's without erythema or swelling, TM's with good bony landmarks and cone of light. Non erythematous.     Nose: Nose normal.     Mouth/Throat:     Comments: Oral mucosa pink and moist, no tonsillar enlargement or exudate. Posterior pharynx patent and nonerythematous, no uvula deviation or swelling. Normal phonation. Eyes:     Conjunctiva/sclera: Conjunctivae normal.  Neck:     Comments: Right tonsillar lymphadenopathy, no overlying erythema or swelling, flecked range of motion of neck Cardiovascular:     Rate and Rhythm: Normal rate.  Pulmonary:     Effort: Pulmonary effort is normal. No respiratory distress.     Comments: Breathing comfortably at rest, CTABL, no wheezing, rales or other adventitious sounds auscultated Abdominal:     General: There is no distension.     Comments: Soft, nondistended, tenderness to palpation epigastrium and left upper quadrant, negative rebound, negative Rovsing, negative McBurney's  Musculoskeletal:        General: Normal range of motion.     Cervical back: Neck supple.  Skin:    General: Skin is warm and dry.  Neurological:     Mental Status: She is alert and oriented to person, place, and time.      UC Treatments / Results  Labs (all labs ordered are listed, but only abnormal results are displayed) Labs Reviewed  COVID-19, FLU A+B NAA  CULTURE, GROUP A STREP [facility])  POCT RAPID STREP A (OFFICE)    EKG   Radiology No results found.  Procedures Procedures (including critical care time)  Medications Ordered in UC Medications - No data to display  Initial Impression / Assessment and Plan / UC Course  I have reviewed the triage vital signs and the nursing notes.  Pertinent labs & imaging results that were available during my  care of the patient were reviewed by me and considered in my medical decision making (see chart for details).     Strep test negative, COVID test pending, suspect likely viral etiology given associated URI symptoms with GI symptoms.  Recommending continued symptomatic and supportive care, negative peritoneal signs on abdominal exam, and low suspicion of abdominal emergency.  Continue to monitor,Discussed strict return precautions. Patient verbalized understanding and is agreeable with plan.  Final Clinical Impressions(s) /  UC Diagnoses   Final diagnoses:  Nausea without vomiting  Fever, unspecified  Sore throat  Diarrhea, unspecified type     Discharge Instructions     Strep test negative COVID test pending Rest and fluids Tylenol and ibuprofen for sore throat, fevers Zofran dissolved in mouth as needed for nausea Drink plenty of fluids May use over-the-counter Imodium/Pepto-Bismol as needed for diarrhea Follow-up if not improving or worsening    ED Prescriptions    Medication Sig Dispense Auth. Provider   ondansetron (ZOFRAN ODT) 4 MG disintegrating tablet Take 1 tablet (4 mg total) by mouth every 8 (eight) hours as needed for nausea or vomiting. 20 tablet Christena Sunderlin C, PA-C   ibuprofen (ADVIL) 800 MG tablet Take 1 tablet (800 mg total) by mouth 3 (three) times daily. 21 tablet Nieves Chapa, [place] C, PA-C     PDMP not reviewed this encounter.   Janith Lima, PA-C 02/22/21 1851    Janith Lima, PA-C 02/22/21 1851

## 2021-02-22 NOTE — ED Triage Notes (Signed)
Patient presents to Urgent Care with complaints of headaches, chills, nausea, diarrhea, cough, fever (99-100.4), sore throat, and constant abdominal pain since Tuesday. Treating symptoms with Nyquil.   Denies vomiting.

## 2021-02-23 LAB — COVID-19, FLU A+B NAA
Influenza A, NAA: NOT DETECTED
Influenza B, NAA: NOT DETECTED
SARS-CoV-2, NAA: NOT DETECTED

## 2021-02-25 LAB — CULTURE, GROUP A STREP (THRC)

## 2021-02-28 ENCOUNTER — Other Ambulatory Visit: Payer: Self-pay

## 2021-02-28 ENCOUNTER — Encounter: Payer: Self-pay | Admitting: Family Medicine

## 2021-02-28 ENCOUNTER — Ambulatory Visit: Payer: 59 | Attending: Family Medicine | Admitting: Family Medicine

## 2021-02-28 ENCOUNTER — Other Ambulatory Visit: Payer: Self-pay | Admitting: Family Medicine

## 2021-02-28 VITALS — BP 112/70 | HR 82 | Ht 62.0 in | Wt 164.0 lb

## 2021-02-28 DIAGNOSIS — D170 Benign lipomatous neoplasm of skin and subcutaneous tissue of head, face and neck: Secondary | ICD-10-CM

## 2021-02-28 DIAGNOSIS — K588 Other irritable bowel syndrome: Secondary | ICD-10-CM | POA: Diagnosis not present

## 2021-02-28 DIAGNOSIS — M542 Cervicalgia: Secondary | ICD-10-CM | POA: Diagnosis not present

## 2021-02-28 MED ORDER — DICLOFENAC SODIUM 1 % EX GEL: 4.0000 g | Freq: Four times a day (QID) | CUTANEOUS | 1 refills | Status: DC

## 2021-02-28 MED ORDER — DICYCLOMINE HCL 10 MG PO CAPS: 10.0000 mg | ORAL_CAPSULE | Freq: Two times a day (BID) | ORAL | 2 refills | Status: DC | PRN

## 2021-02-28 NOTE — Progress Notes (Signed)
Subjective:  Patient ID: Alexis Conley, female    DOB: 02/03/2001  Age: 20 y.o. MRN: [id number]  CC: New Patient (Initial Visit)   HPI Alexis Conley is a 20 year old female who presents today to establish care.  Interval History: She has always had a problem with having to move her bowels right after meals. It starts with her stomach 'bubbling' and then she has to go to the bathroom. She has no diarrhea but has cramping prior to moving her bowels. A year and a half ago she noticed a posterior neck lump which she states has been painful when she flexes her neck.  Denies presence of trauma and she thinks it has increased in size. Past Medical History:  Diagnosis Date  . TB lung, latent     History reviewed. No pertinent surgical history.  Family History  Problem Relation Age of Onset  . Healthy Mother   . Healthy Father     Allergies  Allergen Reactions  . Peanut-Containing Drug Products Anaphylaxis  . Shellfish Allergy Anaphylaxis  . Tomato Hives  . Mushroom Extract Complex     Childhood allergy.... Does not know reaction.     Outpatient Medications Prior to Visit  Medication Sig Dispense Refill  . EPINEPHrine (EPI-PEN) 0.3 mg/0.3 mL DEVI Inject 0.3 mg into the muscle daily as needed (for allergy).  (Patient not taking: Reported on 02/28/2021)    . ibuprofen (ADVIL) 800 MG tablet Take 1 tablet (800 mg total) by mouth 3 (three) times daily. (Patient not taking: Reported on 02/28/2021) 21 tablet 0  . ondansetron (ZOFRAN ODT) 4 MG disintegrating tablet Take 1 tablet (4 mg total) by mouth every 8 (eight) hours as needed for nausea or vomiting. (Patient not taking: Reported on 02/28/2021) 20 tablet 0  . rifampin (RIFADIN) 300 MG capsule Take 600 mg by mouth. (Patient not taking: Reported on 02/28/2021)     No facility-administered medications prior to visit.     ROS Review of Systems  Constitutional: Negative for activity change, appetite change and fatigue.  HENT: Negative  for congestion, sinus pressure and sore throat.   Eyes: Negative for visual disturbance.  Respiratory: Negative for cough, chest tightness, shortness of breath and wheezing.   Cardiovascular: Negative for chest pain and palpitations.  Gastrointestinal: Negative for abdominal distention, abdominal pain and constipation.  Endocrine: Negative for polydipsia.  Genitourinary: Negative for dysuria and frequency.  Musculoskeletal: Negative for arthralgias and back pain.  Skin: Negative for rash.  Neurological: Negative for tremors, light-headedness and numbness.  Hematological: Does not bruise/bleed easily.  Psychiatric/Behavioral: Negative for agitation and behavioral problems.    Objective:  BP 112/70   Pulse 82   Ht _0  (1.575 m)   Wt 164 lb (74.4 kg)   LMP 01/29/2021   SpO2 99%   BMI 30.00 kg/m   BP/Weight 02/28/2021 02/22/2021 1/44/3154  Systolic BP 008 676 195  Diastolic BP 70 70 79  Wt. (Lbs) 164 - -  BMI 30 - -      Physical Exam Constitutional:      Appearance: She is well-developed.  Neck:     Vascular: No JVD.     Comments: Large swelling on spinous process of C6-7 which is slightly tender Cardiovascular:     Rate and Rhythm: Normal rate.     Heart sounds: Normal heart sounds. No murmur heard.   Pulmonary:     Effort: Pulmonary effort is normal.     Breath sounds: Normal breath sounds.  No wheezing or rales.  Chest:     Chest wall: No tenderness.  Abdominal:     General: Bowel sounds are normal. There is no distension.     Palpations: Abdomen is soft. There is no mass.     Tenderness: There is no abdominal tenderness.  Musculoskeletal:        General: Normal range of motion.     Right lower leg: No edema.     Left lower leg: No edema.  Neurological:     Mental Status: She is alert and oriented to person, place, and time.  Psychiatric:        Mood and Affect: Mood normal.     CMP Latest Ref Rng & Units 03/30/2020  Glucose 70 - 99 mg/dL 92  BUN 6 - 20  mg/dL 17  Creatinine 0.44 - 1.00 mg/dL 0.76  Sodium 135 - 145 mmol/L 138  Potassium 3.5 - 5.1 mmol/L 4.0  Chloride 98 - 111 mmol/L 107  CO2 22 - 32 mmol/L 22  Calcium 8.9 - 10.3 mg/dL 9.3  Total Protein 6.5 - 8.1 g/dL 6.9  Total Bilirubin 0.3 - 1.2 mg/dL 0.3  Alkaline Phos 38 - 126 U/L 85  AST 15 - 41 U/L 17  ALT 0 - 44 U/L 12    Lipid Panel  No results found for: CHOL, TRIG, HDL, CHOLHDL, VLDL, LDLCALC, LDLDIRECT  CBC    Component Value Date/Time   WBC 8.0 03/30/2020 1809   RBC 5.07 03/30/2020 1809   HGB 11.2 (L) 03/30/2020 1809   HCT 37.3 03/30/2020 1809   PLT 330 03/30/2020 1809   MCV 73.6 (L) 03/30/2020 1809   MCH 22.1 (L) 03/30/2020 1809   MCHC 30.0 03/30/2020 1809   RDW 19.3 (H) 03/30/2020 1809   LYMPHSABS 2.8 03/30/2020 1809   MONOABS 0.6 03/30/2020 1809   EOSABS 0.2 03/30/2020 1809   BASOSABS 0.0 03/30/2020 1809    No results found for: HGBA1C  Assessment & Plan:  1. Lipoma of neck Lesion has been increasing in size. We will commence work-up with ultrasound - US Soft Tissue Head/Neck (NON-THYROID); Future  2. Other irritable bowel syndrome Symptoms are suspicious for IBS however she has no constipation or diarrhea Will start Bentyl and she has been advised to cut back on frequency of administration if constipation occurs. - dicyclomine (BENTYL) 10 MG capsule; Take 1 capsule (10 mg total) by mouth 2 (two) times daily as needed for spasms.  Dispense: 60 capsule; Refill: 2 - CMP14+EGFR - CBC with Differential/Platelet  3. Neck pain Neck pain at site of lipoma Will use topical NSAID - diclofenac Sodium (VOLTAREN) 1 % GEL; Apply 4 g topically 4 (four) times daily.  Dispense: 100 g; Refill: 1    Meds ordered this encounter  Medications  . dicyclomine (BENTYL) 10 MG capsule    Sig: Take 1 capsule (10 mg total) by mouth 2 (two) times daily as needed for spasms.    Dispense:  60 capsule    Refill:  2  . diclofenac Sodium (VOLTAREN) 1 % GEL    Sig: Apply  4 g topically 4 (four) times daily.    Dispense:  100 g    Refill:  1    Follow-up: Return in about 6 months (around 08/30/2021) for Medical conditions.       Charlott Rakes, MD, FAAFP. [facility] and [facility] [place], [place]   02/28/2021, 12:00 PM

## 2021-02-28 NOTE — Patient Instructions (Signed)
Lipoma  A lipoma is a noncancerous (benign) tumor that is made up of fat cells. This is a very common type of soft-tissue growth. Lipomas are usually found under the skin (subcutaneous). They may occur in any tissue of the body that contains fat. Common areas for lipomas to appear include the back, arms, shoulders, buttocks, and thighs. Lipomas grow slowly, and they are usually painless. Most lipomas do not cause problems and do not require treatment. What are the causes? The cause of this condition is not known. What increases the risk? You are more likely to develop this condition if:  You are 40-60 years old.  You have a family history of lipomas. What are the signs or symptoms? A lipoma usually appears as a small, round bump under the skin. In most cases, the lump will:  Feel soft or rubbery.  Not cause pain or other symptoms. However, if a lipoma is located in an area where it pushes on nerves, it can become painful or cause other symptoms. How is this diagnosed? A lipoma can usually be diagnosed with a physical exam. You may also have tests to confirm the diagnosis and to rule out other conditions. Tests may include:  Imaging tests, such as a CT scan or an MRI.  Removal of a tissue sample to be looked at under a microscope (biopsy). How is this treated? Treatment for this condition depends on the size of the lipoma and whether it is causing any symptoms.  For small lipomas that are not causing problems, no treatment is needed.  If a lipoma is bigger or it causes problems, surgery may be done to remove the lipoma. Lipomas can also be removed to improve appearance. Most often, the procedure is done after applying a medicine that numbs the area (local anesthetic).  Liposuction may be done to reduce the size of the lipoma before it is removed through surgery, or it may be done to remove the lipoma. Lipomas are removed with this method in order to limit incision size and scarring. A  liposuction tube is inserted through a small incision into the lipoma, and the contents of the lipoma are removed through the tube with suction. Follow these instructions at home:  Watch your lipoma for any changes.  Keep all follow-up visits as told by your health care provider. This is important. Contact a health care provider if:  Your lipoma becomes larger or hard.  Your lipoma becomes painful, red, or increasingly swollen. These could be signs of infection or a more serious condition. Get help right away if:  You develop tingling or numbness in an area near the lipoma. This could indicate that the lipoma is causing nerve damage. Summary  A lipoma is a noncancerous tumor that is made up of fat cells.  Most lipomas do not cause problems and do not require treatment.  If a lipoma is bigger or it causes problems, surgery may be done to remove the lipoma.  Contact a health care provider if your lipoma becomes larger or hard, or if it becomes painful, red, or increasingly swollen. Pain, redness, and swelling could be signs of infection or a more serious condition. This information is not intended to replace advice given to you by your health care provider. Make sure you discuss any questions you have with your health care provider. Document Revised: 04/26/2019 Document Reviewed: 04/26/2019 Elsevier Patient Education  2021 Elsevier Inc.  

## 2021-02-28 NOTE — Progress Notes (Signed)
Has back pain, lump at top of back. Eats and has to go to the bathroom.

## 2021-03-01 LAB — CBC WITH DIFFERENTIAL/PLATELET
Basophils Absolute: 0 10*3/uL (ref 0.0–0.2)
Basos: 1 %
EOS (ABSOLUTE): 0.1 10*3/uL (ref 0.0–0.4)
Eos: 2 %
Hematocrit: 39.7 % (ref 34.0–46.6)
Hemoglobin: 12.4 g/dL (ref 11.1–15.9)
Immature Grans (Abs): 0 10*3/uL (ref 0.0–0.1)
Immature Granulocytes: 0 %
Lymphocytes Absolute: 1.9 10*3/uL (ref 0.7–3.1)
Lymphs: 32 %
MCH: 25.3 pg — ABNORMAL LOW (ref 26.6–33.0)
MCHC: 31.2 g/dL — ABNORMAL LOW (ref 31.5–35.7)
MCV: 81 fL (ref 79–97)
Monocytes Absolute: 0.4 10*3/uL (ref 0.1–0.9)
Monocytes: 7 %
Neutrophils Absolute: 3.6 10*3/uL (ref 1.4–7.0)
Neutrophils: 58 %
Platelets: 297 10*3/uL (ref 150–450)
RBC: 4.91 x10E6/uL (ref 3.77–5.28)
RDW: 14.6 % (ref 11.7–15.4)
WBC: 6.1 10*3/uL (ref 3.4–10.8)

## 2021-03-01 LAB — CMP14+EGFR
ALT: 11 IU/L (ref 0–32)
AST: 12 IU/L (ref 0–40)
Albumin/Globulin Ratio: 1.7 (ref 1.2–2.2)
Albumin: 4.5 g/dL (ref 3.9–5.0)
Alkaline Phosphatase: 94 IU/L (ref 44–121)
BUN/Creatinine Ratio: 18 (ref 9–23)
BUN: 14 mg/dL (ref 6–20)
Bilirubin Total: <0.2 mg/dL (ref 0.0–1.2)
CO2: 24 mmol/L (ref 20–29)
Calcium: 9.3 mg/dL (ref 8.7–10.2)
Chloride: 106 mmol/L (ref 96–106)
Creatinine, Ser: 0.77 mg/dL (ref 0.57–1.00)
Globulin, Total: 2.6 g/dL (ref 1.5–4.5)
Glucose: 80 mg/dL (ref 65–99)
Potassium: 4.1 mmol/L (ref 3.5–5.2)
Sodium: 143 mmol/L (ref 134–144)
Total Protein: 7.1 g/dL (ref 6.0–8.5)
eGFR: 112 mL/min/{1.73_m2} (ref >59)

## 2021-03-12 ENCOUNTER — Ambulatory Visit (HOSPITAL_COMMUNITY)
Admission: RE | Admit: 2021-03-12 | Discharge: 2021-03-12 | Disposition: A | Discharge status: Home / Self Care | Payer: 59 | Source: Ambulatory Visit | Attending: Family Medicine | Admitting: Family Medicine

## 2021-03-12 ENCOUNTER — Other Ambulatory Visit: Payer: Self-pay

## 2021-03-12 DIAGNOSIS — D170 Benign lipomatous neoplasm of skin and subcutaneous tissue of head, face and neck: Secondary | ICD-10-CM | POA: Diagnosis not present

## 2021-05-16 ENCOUNTER — Other Ambulatory Visit: Payer: Self-pay

## 2021-05-16 ENCOUNTER — Ambulatory Visit (INDEPENDENT_AMBULATORY_CARE_PROVIDER_SITE_OTHER): Payer: 59

## 2021-05-16 VITALS — BP 100/65 | HR 75 | Ht 62.0 in | Wt 161.8 lb

## 2021-05-16 DIAGNOSIS — Z3201 Encounter for pregnancy test, result positive: Secondary | ICD-10-CM | POA: Diagnosis not present

## 2021-05-16 LAB — POCT PREGNANCY, URINE: Preg Test, Ur: POSITIVE — AB

## 2021-05-16 MED ORDER — PREPLUS 27-1 MG PO TABS: 1.0000 | ORAL_TABLET | Freq: Every day | ORAL | 6 refills | Status: DC

## 2021-05-16 NOTE — Progress Notes (Addendum)
Pt here today for UPT. UPT is positive. Pt states has taken home UPT that was positive as well.   Pt denies any vaginal bleeding, abd pain or cramps at this time. Pt advised to start taking PNV and to call [facility] for next OB appt. Pt verbalized understanding and agreeable to plan of care.   LMP: 03/29/21 EDC: 01/03/2022 [redacted]w[redacted]d  SColletta [place] RN    Chart reviewed for nurse visit. Agree with plan of care.   BVirginia Rochester NP 05/16/2021 12:12 PM

## 2021-05-16 NOTE — Patient Instructions (Addendum)
Prenatal Care Providers           [facility] [place] [facility] [place] [facility]  [place] [phone]   [facility] for [facility] [place] [phone]    [facility] Phone: [phone]   Safe Medications in Pregnancy   Acne:  Benzoyl Peroxide  Salicylic Acid   Backache/Headache:  Tylenol: 2 regular strength every 4 hours OR               2 Extra strength every 6 hours   Colds/Coughs/Allergies:  Benadryl (alcohol free) 25 mg every 6 hours as needed  Breath right strips  Claritin  Cepacol throat lozenges  Chloraseptic throat spray  Cold-Eeze- up to three times per day  Cough drops, alcohol free  Flonase (by prescription only)  Guaifenesin  Mucinex  Robitussin DM (plain only, alcohol free)  Saline nasal spray/drops  Sudafed (pseudoephedrine) & Actifed * use only after [redacted] weeks gestation and if you do not have high blood pressure  Tylenol  Vicks Vaporub  Zinc lozenges  Zyrtec   Constipation:  Colace  Ducolax suppositories  Fleet enema  Glycerin suppositories  Metamucil  Milk of magnesia  Miralax  Senokot  Smooth move tea   Diarrhea:  Kaopectate  Imodium A-D   *NO pepto Bismol   Hemorrhoids:  Anusol  Anusol HC  Preparation H  Tucks   Indigestion:  Tums  Maalox  Mylanta  Zantac  Pepcid   Insomnia:  Benadryl (alcohol free) '25mg'$  every 6 hours as needed  Tylenol PM  Unisom, no Gelcaps   Leg Cramps:  Tums  MagGel   Nausea/Vomiting:  Bonine  Dramamine  Emetrol  Ginger extract  Sea bands  Meclizine  Nausea medication to take during pregnancy:  Unisom (doxylamine succinate 25 mg tablets) Take one tablet daily at bedtime. If symptoms are not adequately controlled, the dose can be increased to a maximum recommended dose of two tablets daily (1/2 tablet in the morning, 1/2 tablet mid-afternoon and one at bedtime).  Vitamin B6 '100mg'$  tablets. Take one tablet twice a day (up to 200  mg per day).   Skin Rashes:  Aveeno products  Benadryl cream or '25mg'$  every 6 hours as needed  Calamine Lotion  1% cortisone cream   Yeast infection:  Gyne-lotrimin 7  Monistat 7    **If taking multiple medications, please check labels to avoid duplicating the same active ingredients  **take medication as directed on the label  ** Do not exceed 4000 mg of tylenol in 24 hours  **Do not take medications that contain aspirin or ibuprofen

## 2021-05-27 ENCOUNTER — Telehealth: Payer: 59 | Admitting: Family

## 2021-05-27 DIAGNOSIS — N898 Other specified noninflammatory disorders of vagina: Secondary | ICD-10-CM

## 2021-05-27 NOTE — Progress Notes (Signed)
For the safety of you and your child, I recommend a face to face office visit with a health care provider.  Given you are pregnant and having vaginal discharge, you need to be seen in person for further testing.   Many mothers need to take medicines during their pregnancy and while nursing.  Almost all medicines pass into the breast milk in small quantities.  Most are generally considered safe for a mother to take but some medicines must be avoided.  After reviewing your E-Visit request, I recommend that you consult your OB/GYN or pediatrician for medical advice in relation to your condition and prescription medications while pregnant or breastfeeding.  NOTE:  There will be NO CHARGE for this eVisit  If you are having a true medical emergency please call 911.    For an urgent face to face visit, [facility] has six urgent care centers for your convenience:     [facility] [place] [facility] [place] Get Driving Directions [id number] [place], [place] [address]    [facility]) Get Driving Directions [id number] [place], [place] [address]  [facility] [place] ([place]) Get Driving Directions [id number] [address] [place],  [place]  [address]  [facility] [place] Get Driving Directions [id number] [place], [place], [place] [address]   [facility] [place] Get Driving Directions  [id number] [address] [place], [place] [address]    [facility] [place] Get Driving Directions [id number] [address] [place],  [address]  Your MyChart E-visit questionnaire answers were reviewed by a board certified advanced clinical practitioner to complete your personal care plan based on your specific symptoms.  Thank you for using e-Visits.

## 2021-06-05 ENCOUNTER — Ambulatory Visit (INDEPENDENT_AMBULATORY_CARE_PROVIDER_SITE_OTHER): Payer: 59

## 2021-06-05 ENCOUNTER — Other Ambulatory Visit (HOSPITAL_COMMUNITY)
Admission: RE | Admit: 2021-06-05 | Discharge: 2021-06-05 | Disposition: A | Discharge status: Home / Self Care | Payer: 59 | Source: Ambulatory Visit | Attending: Family Medicine | Admitting: Family Medicine

## 2021-06-05 ENCOUNTER — Other Ambulatory Visit: Payer: Self-pay

## 2021-06-05 VITALS — BP 121/76 | HR 83 | Wt 164.0 lb

## 2021-06-05 DIAGNOSIS — B373 Candidiasis of vulva and vagina: Secondary | ICD-10-CM | POA: Insufficient documentation

## 2021-06-05 DIAGNOSIS — N898 Other specified noninflammatory disorders of vagina: Secondary | ICD-10-CM | POA: Diagnosis present

## 2021-06-05 NOTE — Progress Notes (Signed)
Chart reviewed for nurse visit. Agree with plan of care.   Renard Matter, MD 06/05/2021 12:29 PM

## 2021-06-05 NOTE — Progress Notes (Signed)
Pt here today with c/o having cramping in the lower abdomen with white discharge that has an odor.  Pt reports that the odor she "thinks smells like fish."  Pt explained how to obtain self swab and that we will call with abnormal results in 24-48 hrs.  Pt verbalized understanding with no further questions.  Frances Nickels  06/05/21

## 2021-06-06 LAB — CERVICOVAGINAL ANCILLARY ONLY
Bacterial Vaginitis (gardnerella): POSITIVE — AB
Candida Glabrata: NEGATIVE
Candida Vaginitis: POSITIVE — AB
Chlamydia: NEGATIVE
Comment: NEGATIVE
Comment: NEGATIVE
Comment: NEGATIVE
Comment: NEGATIVE
Comment: NEGATIVE
Comment: NORMAL
Neisseria Gonorrhea: NEGATIVE
Trichomonas: NEGATIVE

## 2021-06-10 ENCOUNTER — Other Ambulatory Visit: Payer: Self-pay | Admitting: Family Medicine

## 2021-06-10 MED ORDER — METRONIDAZOLE 500 MG PO TABS: 500.0000 mg | ORAL_TABLET | Freq: Two times a day (BID) | ORAL | 0 refills | Status: AC

## 2021-06-10 MED ORDER — TERCONAZOLE 0.8 % VA CREA: 1.0000 | TOPICAL_CREAM | Freq: Every day | VAGINAL | 0 refills | Status: AC

## 2021-06-13 ENCOUNTER — Telehealth: Payer: Self-pay

## 2021-06-13 ENCOUNTER — Telehealth (INDEPENDENT_AMBULATORY_CARE_PROVIDER_SITE_OTHER): Payer: 59

## 2021-06-13 DIAGNOSIS — Z3A00 Weeks of gestation of pregnancy not specified: Secondary | ICD-10-CM

## 2021-06-13 DIAGNOSIS — O0990 Supervision of high risk pregnancy, unspecified, unspecified trimester: Secondary | ICD-10-CM | POA: Insufficient documentation

## 2021-06-13 DIAGNOSIS — Z3490 Encounter for supervision of normal pregnancy, unspecified, unspecified trimester: Secondary | ICD-10-CM

## 2021-06-13 NOTE — Telephone Encounter (Signed)
Called Pt to advise change of Dr's appt since she agreed to Mom/Baby Dyad, new appt is 07/04/21@ 1:35p. Pt verbalized understanding.

## 2021-06-13 NOTE — Patient Instructions (Signed)
AREA PEDIATRIC/FAMILY PRACTICE PHYSICIANS  [place] ([address]) [facility] Chambliss, MD; Eniola, MD; Hale, MD; Hensel, MD; McDiarmid, MD; McIntyer, MD; Kayleeann Huxford, MD; Walden, MD [address] [place], [place] [address] [phone] Mon-Fri 8:30-12:30, 1:30-5:00 Providers come to see babies at [facility] Accepting Medicaid [facility] providers who accept newborns: Koirala, MD; Morrow, MD; Wolters, MD [address], [place], [place] [address] [phone] Mon-Fri 8:00-5:30 Babies seen by providers at [facility] Does NOT accept Medicaid Please call early in hospitalization for appointment (limited availability)  [facility] Mulberry, MD [address] [place], [place] [address] [phone] Mon, Tue, Thur, Fri 8:30-5:00, Wed 10:00-7:00 (closed 1-2pm) Babies seen by [facility] providers Accepting Medicaid Rubin - Pediatrician Rubin, MD [address], [place], [place] [address] [phone] Mon-Fri 8:30-5:00, Sat 8:30-12:00 Provider comes to see babies at [facility] Accepting Medicaid Must have been referred from current patients or contacted office prior to delivery [facility] ([facility]) Brown, MD; Chandler, MD; Ettefagh, MD; Grant, MD; Lester, MD; McCormick, MD; McQueen, MD; Prose, MD; Simha, MD; Stanley, MD; Stryffeler, NP; Tebben, NP [address], [place], [place] [address] [phone] Mon, Tue, Thur, Fri 8:30-5:30, Wed 9:30-5:30, Sat 8:30-12:30 Babies seen by [facility] providers Accepting Medicaid Only accepting infants of first-time parents or siblings of current patients Hospital discharge coordinator will make follow-up appointment Alexis Conley [address], [place], [place]  [address] [phone]   Fax - [phone] [facility] [address], [place], [place]  [address] Phone - [phone]   Fax - [phone] Shilpa Gosrani [address], [place], [place]  [address] [phone]  [place] ([address]) [facility] Bates, MD; [place]ass[place], MD; Cooper, Cox, MD; MD; Davis, MD; Dovico, MD; Ettefaugh, MD; Little, MD; Lowe, MD; Keiffer, MD; Melvin, MD; Sumner, MD; Williams, MD [address], [place], [place] [address] [phone] Mon-Fri 8:30-5:00 (extended evenings Mon-Thur as needed), Sat-Sun 10:00-1:00 Providers come to see babies at [facility] Accepting Medicaid for families of first-time babies and families with all children in the household age 3 and under. Must register with office prior to making appointment (M-F only). [facility] Henson, NP; Knapp, MD; Lalonde, MD; Tysinger, PA [address] [place], [place] [address] [phone] Mon-Fri 8:00-5:00 Babies seen by providers at [facility] Does NOT accept Medicaid/Commercial Insurance Only [facility] ([facility])  Artis, MD; Barnes, MD; Bratton, MD; Coccaro, MD; Lockett Gardner, MD; Kramer, MD; Marshall, MD; Netherton, MD; Poleto, MD; Skinner, MD [address] [place], [place] [address] [phone] Mon-Fri 8:30-5:30, Sat (Oct.-Mar.) 9:00-1:00 Babies seen by providers at [facility] Accepting Medicaid  [place] ([address]) [facility] Reid, MD; Warner, MD [address], , [place] [address] [phone] Mon-Fri 8:30-5:00, Sat 8:30-12:00 Providers come to see babies at [facility] Does NOT accept Medicaid [facility] Becker, PA; Hagler, MD; Scifres, PA; Sun, MD; Swayne, MD [address], , [place] [address] [phone] Mon-Fri 8:00-5:00 Babies seen by providers at [facility] Does NOT accept Medicaid Only accepting babies of parents who  are patients Please call early in hospitalization for appointment (limited availability)  Pediatricians Clark, MD; Frye, MD; Kelleher, MD; Mack, NP; Miller, MD; O'Keller, MD; Patterson, NP; Pudlo, MD; Puzio, MD; Thomas, MD; Tucker, MD; Twiselton, MD [address], [place], [place] [address] [phone] Mon-Fri 8:00-5:00, Sat 9:00-12:00 Providers come to see babies at [facility] Does NOT accept Medicaid  [place] ([address]) [facility] providers accepting new patients: Brake, NP; Wharton, PA [address], [place], [place] [address] [phone] Mon-Fri 8:00-5:00 Babies seen by providers at [facility] Does NOT accept Medicaid Only accepting babies of parents who are patients Please call early in hospitalization for appointment (limited availability) [facility] Gay, MD; Quinlan, MD [address] [place], [place] [address] [phone] (press 1 to schedule appointment) Mon-Fri 8:00-5:00 Providers come to see babies at [facility] Does NOT accept Medicaid [facility] Mazer, MD [address] [place], [place] [address] [phone] Mon-Fri 8:30-5:00 (lunch 12:30-1:00), extended hours by appointment only Wed 5:00-6:30 Babies seen by [facility] providers Accepting Medicaid [facility] Banks, MD; [place], MD; Koberlein, MD [address], [place], [place] [address] [phone] Mon-Fri 8:00-5:00 Babies seen by [facility] providers Does NOT accept Medicaid [facility] Parker, MD; Hunter, MD; Wallace, DO [address] [place], [place] [address] [phone] Mon-Fri 8:00-5:00 Babies seen by [facility] providers Does NOT accept Medicaid [facility] Brandon, PA; Brecken, PA; Christy, NP; Dees, MD; DeClaire, MD; DeWeese, MD; Hansen, NP; [place], NP; Parrish, NP; Smoot, NP; Summer, MD; Vapne,  MD [address] [place], [place] [address] [phone] Mon-Fri 8:30-5:00, Sat 10:00-1:00 Providers come to see babies at [facility] Does NOT accept Medicaid Free prenatal information session Tuesdays at 4:45pm [facility] Bouska, MD; Gordon, PA; Jeffery, PA; Weber, PA [address] [place] [address] [phone] Mon-Fri 7:30-5:30 Babies seen by [facility] providers [facility] [address], [place], [place]  [address] [phone]   Fax - [phone]  [place] ([address] & [address]) [facility] [place] [address] [place], [place] [address] [phone] Mon-Thur 8:00-6:00 Providers come to see babies at [facility] Accepting Medicaid [facility] Anderson, NP; Badger, MD; Beal, PA; Spencer, PA [address] [place], [place] [address] [phone] Mon-Thur 7:30-7:30, Fri 7:30-4:30 Babies seen by [facility] providers Accepting Medicaid [facility] Agbuya, MD; Klett, NP; Romgoolam, MD [address], [place], [place] [address] [phone] Mon-Fri 8:30-5:00, Sat 8:30-12:00 Providers come to see babies at [facility] Accepting Medicaid Must have "Meet & Greet" appointment at office prior to delivery [facility] McCord, MD; Wallace, MD; Wood, MD [address], [place], [place] [address] [phone] Mon-Wed 8:00-6:00, Thur-Fri 8:00-5:00, Sat 9:00-12:00 Providers come to see babies at [facility] Does NOT accept Medicaid Only accepting siblings of current patients [facility]  [address], [place], [place]  [address] [phone]   Fax - [phone] [facility] [address], [place], [place]  [address] [phone]   Fax -  [phone]  [place] ([address] & [address]) [facility] Cirigliano, DO; Matthews, DO [address] [place], [place] [address] [phone] Mon-Fri 7:00-5:00 Babies seen by [facility] providers Does NOT accept Medicaid [facility] Briscoe, MD; Howley, PA; Moreira, PA [address], [place], [place] [address] [phone] Mon-Fri 8:00-5:00 Babies seen by [facility] providers Accepting Medicaid [facility] Boyd, MD; Church, PA; Jones, NP; Osborn, PA [address], , [place] [address] [phone] Mon-Fri 8:00-5:00 Babies seen by providers at [facility] Accepting Medicaid  [place] ([address]) [facility] [place] Wendling, DO [address] [place], [place] [address] [phone] Mon-Fri 8:00-5:00 Babies seen by [facility] providers Does NOT accept Medicaid Limited availability, please call early in hospitalization to schedule follow-up [facility] Calderon, PA; Cummings, MD; Dillard, MD; Martin, PA; Olson, MD; VanDeven, PA [address], [place], [place] [address] [phone] Mon-Fri 8:30-5:00, Sat 9:00-12:00 Babies seen by providers at [facility] Accepting Medicaid Please register online then schedule online or call office [email or web address] Hunter, NP; Kumar, MD; Martin Rogers, PA [address], [place], [place] [address] [phone] Mon-Fri 8:00-5:00 Babies seen by providers at [facility] Accepting Medicaid [facility] ([facility] Keene, MD; Kristi Fleenor, NP; [place], MD [address], [place], [place] [address] [phone] Mon-Fri 8:00-5:30, Sat&Sun by appointment (phones open at  8:30) Babies seen by [facility] providers Accepting Medicaid Must be a first-time baby or sibling of current patient [facility] [place]  [address], [place], [place]  [address] [phone]   Fax - [phone]  [place] ([address]) [facility] Brown, PA; Cowen, PA; Rice, MD; Helton, PA; Spry, MD [address] [place], [place] [address] [phone] Mon-Thur 8:00-7:00, Fri 8:00-5:00, Sat 8:00-12:00, Sun 9:00-12:00 Babies seen by [facility] providers Accepting Medicaid [facility] [place] Coe-Goins, MD; Marshall, MD; Pierre-Louis, MD [address] [place], [place], [place] [address] [phone] Mon-Thur 8:00-5:00 Babies seen by providers at [facility] Accepting Medicaid [facility] Bratton, MD; Coe-Goins, MD; Hayes, MD; Lewis, MD; List, MD; Lott, MD; Marshall, MD; Moran, MD; O'Eian Vandervelden, MD; Pierre-Louis, MD; Pitonzo, MD; Scholer, MD; Spangle, MD [address] [place], [place] [address] [phone] Mon-Fri 8:00-5:30, Sat (Oct.-Mar.) 9:00-1:00 Babies seen by providers at [facility] Accepting Medicaid Must fill out new patient packet, available online at [email or web address] [facility] ([facility]) Friddle, NP; Harris, NP; Kelly, NP; Logan, MD; Melvin, PA; Poth, MD; Ramadoss, MD; Stanton, NP [address], [place], [place] [address] [phone] Mon-Thur 8:00-5:30, Fri 8:00-5:00 Babies seen by providers at [facility] Accepting Medicaid  [place] ([address]) [facility] Dixon, PA; [place], MD; Pickard, MD; Tapia, PA [address], [place], [place] [address] [phone] Mon-Fri 8:00-5:00 Babies seen by providers at [facility] Accepting Medicaid   [place] ([address]) [facility] [place] Masneri, DO; Meyers, MD; Nelson, PA [address], [place], [place] [address] [phone] Mon-Fri 8:00-5:00 Babies seen by providers at [facility] Does NOT accept Medicaid Limited appointment availability, please call early in hospitalization  [facility] [place] Kunedd, DO; McGowen, MD [address], [place], [place] [address] [phone] Mon-Fri 8:00-5:00 Babies seen by [facility] providers Does NOT accept Medicaid [facility] [place] Cameron, MD; MacDonald, MD; Michaels, PA; Nayak, MD [address] [place] [address], [place], [place] [address] [phone] Mon-Fri 8:00-5:00 After hours clinic ([address] [place], [place] [address]) [phone] Mon-Fri 5:00-8:00, Sat 12:00-6:00, Sun 10:00-4:00 Babies seen by [facility] providers Accepting Medicaid [facility] [place] [address], [place], [place]  [address] [phone]   Fax - [phone]  [place] ([address]) [facility] [place] [facility] Andy, MD [address], [place], [place] [address] [phone] Mon-Fri 8:00-5:00 Babies seen by [facility] providers Does NOT accept Medicaid [facility] [place] ([facility] [place]) Eksir, MD [address], [place], [place] [address] [phone] Mon-Thur 8:00-7:00, Fri 8:00-5:00, Sat 8:00-12:00 Babies seen by providers at [facility] Accepting Medicaid - but does not have vaccinations in office (must be received elsewhere) Limited availability, please call early in hospitalization  [place] ([address]) [facility]  Charlene Flemming, MD [address], [place] [address] [phone]  Fax [phone]  [facility]  Kimberly [place], MD, Annamarie Streilein, PA, Carla [place], PA [address] [place], [place]  [address] [phone] [facility]  [address], [place], [place] [address] [phone] [address], [place], [place] [address] [phone] (West Office)  [facility] [address], [place], [place] [address] [phone] [facility] [address], [place], [place] [address] [phone] [facility] [address], [place], [place] [address] [phone] [facility] [address], [place], [place] [address] [phone] [facility] [address], [place], [place] [address] [phone] [facility] [address], [place], [place] [address] [phone] [facility]  [address], [place], [place] [address] [phone] Dr[facility] [address] [place] [address], [place], [place] [address] [phone] [facility] [address], [place], [place] [address] [phone] [facility] [address] [place] [address], [place], [place] [address] [phone]  

## 2021-06-13 NOTE — Progress Notes (Signed)
New OB Intake  I connected with  Alexis Conley on 06/13/21 at  9:15 AM EDT by MyChart Video Visit and verified that I am speaking with the correct person using two identifiers. Nurse is located at [facility] and pt is located at home.  I discussed the limitations, risks, security and privacy concerns of performing an evaluation and management service by telephone and the availability of in person appointments. I also discussed with the patient that there may be a patient responsible charge related to this service. The patient expressed understanding and agreed to proceed.  I explained I am completing New OB Intake today. We discussed her EDD of 01/03/22 that is based on LMP of 03/29/21. Pt is G1/P0. I reviewed her allergies, medications, Medical/Surgical/OB history, and appropriate screenings. I informed her of [facility] services. 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   There are no problems to display for this patient.   Concerns addressed today  Delivery Plans:  Plans to deliver at [facility].   MyChart/Babyscripts MyChart access verified. I explained pt will have some visits in office and some virtually. Babyscripts instructions given and order placed. Patient verifies receipt of registration text/e-mail. Account successfully created and app downloaded.  Blood Pressure Cuff  Patient has private insurance; instructed to purchase blood pressure cuff and bring to first prenatal appt. Explained after first prenatal appt pt will check weekly and document in 50.  Weight scale: Patient    have weight scale. Weight scale ordered for patient to pick up form [facility].   Anatomy US Explained first scheduled [place] will be around 19 weeks. Anatomy US scheduled for 08/09/21 at 10:15a. Pt notified to arrive at 10:00a.  Labs Discussed Johnsie Cancel genetic screening with patient. Would like both Panorama and Horizon drawn at new OB visit. Routine prenatal labs needed.  Covid  Vaccine Patient has covid vaccine.   Mother/ Baby Dyad Candidate?    If yes, offer as possibility  Informed patient of [facility] website  and placed link in her AVS.   Social Determinants of Health Food Insecurity: Patient denies food insecurity. WIC Referral: Patient is interested in referral to [facility].  Transportation: Patient denies transportation needs. Childcare: Discussed no children allowed at ultrasound appointments. Offered childcare services; patient declines childcare services at this time.  Send link to Pregnancy Navigators   Placed OB Box on problem list and updated  First visit review I reviewed new OB appt with pt. I explained she will have a pelvic exam, ob bloodwork with genetic screening, and PAP smear. Explained pt will be seen by Dr. Dione Plover at first visit; encounter routed to appropriate provider. Explained that patient will be seen by pregnancy navigator following visit with provider. [facility] information placed in AVS.   Bethanne Ginger, [place] 06/13/2021  9:10 AM

## 2021-06-13 NOTE — Progress Notes (Signed)
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

## 2021-06-22 ENCOUNTER — Encounter: Payer: 59 | Admitting: Obstetrics and Gynecology

## 2021-07-04 ENCOUNTER — Other Ambulatory Visit: Payer: Self-pay

## 2021-07-04 ENCOUNTER — Other Ambulatory Visit (HOSPITAL_COMMUNITY)
Admission: RE | Admit: 2021-07-04 | Discharge: 2021-07-04 | Disposition: A | Discharge status: Home / Self Care | Payer: 59 | Source: Ambulatory Visit | Attending: Obstetrics and Gynecology | Admitting: Obstetrics and Gynecology

## 2021-07-04 ENCOUNTER — Ambulatory Visit (INDEPENDENT_AMBULATORY_CARE_PROVIDER_SITE_OTHER): Payer: 59 | Admitting: Family Medicine

## 2021-07-04 VITALS — BP 124/73 | HR 90 | Ht 62.0 in | Wt 163.4 lb

## 2021-07-04 DIAGNOSIS — O0990 Supervision of high risk pregnancy, unspecified, unspecified trimester: Secondary | ICD-10-CM | POA: Diagnosis present

## 2021-07-04 DIAGNOSIS — N760 Acute vaginitis: Secondary | ICD-10-CM

## 2021-07-04 DIAGNOSIS — Z124 Encounter for screening for malignant neoplasm of cervix: Secondary | ICD-10-CM | POA: Diagnosis present

## 2021-07-04 DIAGNOSIS — Z3A00 Weeks of gestation of pregnancy not specified: Secondary | ICD-10-CM | POA: Insufficient documentation

## 2021-07-04 DIAGNOSIS — B9689 Other specified bacterial agents as the cause of diseases classified elsewhere: Secondary | ICD-10-CM

## 2021-07-04 DIAGNOSIS — Z3A13 13 weeks gestation of pregnancy: Secondary | ICD-10-CM

## 2021-07-04 NOTE — Progress Notes (Signed)
Subjective:   Alexis Conley is a 20 y.o. G1P0 at [redacted]w[redacted]d by LMP being seen today for her first obstetrical visit.  Her obstetrical history is significant for  n/a . Patient does intend to breast feed. Pregnancy history fully reviewed.  Patient reports no complaints.  HISTORY: OB History  Gravida Para Term Preterm AB Living  1 0 0 0 0 0  SAB IAB Ectopic Multiple Live Births  0 0 0 0 0    # Outcome Date GA Lbr Len/2nd Weight Sex Delivery Anes PTL Lv  1 Current              Last pap smear: No results found for: DIAGPAP, HPV, HPVHIGH needs  Past Medical History:  Diagnosis Date   TB lung, latent    Past Surgical History:  Procedure Laterality Date   NO PAST SURGERIES     Family History  Problem Relation Age of Onset   Healthy Mother    Healthy Father    Social History   Tobacco Use   Smoking status: Never   Smokeless tobacco: Never  Vaping Use   Vaping Use: Never used  Substance Use Topics   Alcohol use: No   Drug use: No   Allergies  Allergen Reactions   Peanut-Containing Drug Products Anaphylaxis   Shellfish Allergy Anaphylaxis   Tomato Hives   Mushroom Extract Complex     Childhood allergy.... Does not know reaction.    Current Outpatient Medications on File Prior to Visit  Medication Sig Dispense Refill   Prenatal Vit-Fe Fumarate-FA (PREPLUS) 27-1 MG TABS Take 1 tablet by mouth daily. 30 tablet 6   [DISCONTINUED] fluticasone (FLONASE) 50 MCG/ACT nasal spray Place 1-2 sprays into both nostrils daily. 16 g 0   [DISCONTINUED] norgestimate-ethinyl estradiol (SPRINTEC 28) 0.25-35 MG-MCG tablet Take 1 tablet by mouth daily. 28 tablet 11   No current facility-administered medications on file prior to visit.     Exam   Vitals:   07/04/21 1332  BP: 124/73  Pulse: 90  Weight: 163 lb 6.4 oz (74.1 kg)  Height: 5\' 2"  (1.575 m)   Fetal Heart Rate (bpm): 143  Uterus:     Pelvic Exam: Perineum: no hemorrhoids, normal perineum   Vulva: normal external  genitalia, no lesions   Vagina:  normal mucosa mild amount of white discharge but denies any symptoms   Cervix: no lesions and normal, pap smear done.   System: General: well-developed, well-nourished female in no acute distress   Skin: normal coloration and turgor, no rashes   Neurologic: oriented, normal, negative, normal mood   Extremities: normal strength, tone, and muscle mass, ROM of all joints is normal   HEENT PERRLA, extraocular movement intact and sclera clear, anicteric   Neck supple and no masses   Respiratory:  no respiratory distress      Assessment:   Pregnancy: G1P0 Patient Active Problem List   Diagnosis Date Noted   Supervision of high risk pregnancy, antepartum 06/13/2021     Plan:  1. Supervision of high risk pregnancy, antepartum Initial labs drawn. Continue prenatal vitamins. Genetic Screening discussed, NIPS: ordered. Ultrasound discussed; fetal anatomic survey: ordered. Problem list reviewed and updated. The nature of Dyad/Family Care clinic was explained to patient; Voiced they may need to be seen by other [facility] providers which includes family medicine physicians, OB GYNs, and APPs. Delivery will hopefully be with one of the Dyad providers or another [facility] Medicine physician and we cannot promise this  at this time.  Discussed there are [facility] staff in the hospital 24-7 and they understand and support this model and there is a likelihood one of these providers will catch their baby.  We also discussed that the service includes learners (residents, student) and they will be involved in the care team.   2. Screening for cervical cancer Pap collected   Routine obstetric precautions reviewed. Return in about 4 weeks (around 08/01/2021) for Dyad patient.

## 2021-07-04 NOTE — Addendum Note (Signed)
Addended by: Georgia Lopes on: 07/04/2021 04:28 PM   Modules accepted: Orders

## 2021-07-05 LAB — CBC/D/PLT+RPR+RH+ABO+RUBIGG...
Antibody Screen: NEGATIVE
Basophils Absolute: 0 10*3/uL (ref 0.0–0.2)
Basos: 0 %
EOS (ABSOLUTE): 0.1 10*3/uL (ref 0.0–0.4)
Eos: 1 %
HCV Ab: <0.1 s/co ratio (ref 0.0–0.9)
HIV Screen 4th Generation wRfx: NONREACTIVE
Hematocrit: 37.3 % (ref 34.0–46.6)
Hemoglobin: 12.5 g/dL (ref 11.1–15.9)
Hepatitis B Surface Ag: NEGATIVE
Immature Grans (Abs): 0 10*3/uL (ref 0.0–0.1)
Immature Granulocytes: 0 %
Lymphocytes Absolute: 2.2 10*3/uL (ref 0.7–3.1)
Lymphs: 30 %
MCH: 26.3 pg — ABNORMAL LOW (ref 26.6–33.0)
MCHC: 33.5 g/dL (ref 31.5–35.7)
MCV: 79 fL (ref 79–97)
Monocytes Absolute: 0.7 10*3/uL (ref 0.1–0.9)
Monocytes: 9 %
Neutrophils Absolute: 4.3 10*3/uL (ref 1.4–7.0)
Neutrophils: 60 %
Platelets: 229 10*3/uL (ref 150–450)
RBC: 4.75 x10E6/uL (ref 3.77–5.28)
RDW: 13.8 % (ref 11.7–15.4)
RPR Ser Ql: NONREACTIVE
Rh Factor: POSITIVE
Rubella Antibodies, IGG: 4.23 index (ref >0.99)
WBC: 7.2 10*3/uL (ref 3.4–10.8)

## 2021-07-05 LAB — CERVICOVAGINAL ANCILLARY ONLY
Bacterial Vaginitis (gardnerella): POSITIVE — AB
Candida Glabrata: NEGATIVE
Candida Vaginitis: NEGATIVE
Chlamydia: NEGATIVE
Comment: NEGATIVE
Comment: NEGATIVE
Comment: NEGATIVE
Comment: NEGATIVE
Comment: NEGATIVE
Comment: NORMAL
Neisseria Gonorrhea: NEGATIVE
Trichomonas: NEGATIVE

## 2021-07-05 LAB — HEMOGLOBIN A1C
Est. average glucose Bld gHb Est-mCnc: 103 mg/dL
Hgb A1c MFr Bld: 5.2 % (ref 4.8–5.6)

## 2021-07-05 LAB — HCV INTERPRETATION

## 2021-07-05 MED ORDER — METRONIDAZOLE 0.75 % VA GEL: 1.0000 | Freq: Every day | VAGINAL | 0 refills | Status: AC

## 2021-07-05 NOTE — Addendum Note (Signed)
Addended by: Clayton Lefort on: 07/05/2021 01:52 PM   Modules accepted: Orders

## 2021-07-06 LAB — URINE CULTURE, OB REFLEX

## 2021-07-06 LAB — CULTURE, OB URINE

## 2021-07-09 LAB — CYTOLOGY - PAP
Chlamydia: NEGATIVE
Comment: NEGATIVE
Comment: NEGATIVE
Comment: NEGATIVE
Comment: NORMAL
Diagnosis: UNDETERMINED — AB
High risk HPV: NEGATIVE
Neisseria Gonorrhea: NEGATIVE
Trichomonas: NEGATIVE

## 2021-07-18 ENCOUNTER — Encounter (HOSPITAL_COMMUNITY): Payer: Self-pay | Admitting: Obstetrics and Gynecology

## 2021-07-18 ENCOUNTER — Other Ambulatory Visit: Payer: Self-pay

## 2021-07-18 ENCOUNTER — Inpatient Hospital Stay (HOSPITAL_BASED_OUTPATIENT_CLINIC_OR_DEPARTMENT_OTHER): Payer: 59

## 2021-07-18 ENCOUNTER — Inpatient Hospital Stay (HOSPITAL_COMMUNITY)
Admission: AD | Admit: 2021-07-18 | Discharge: 2021-07-18 | Disposition: A | Discharge status: Home / Self Care | Payer: 59 | Attending: Obstetrics and Gynecology | Admitting: Obstetrics and Gynecology

## 2021-07-18 DIAGNOSIS — R102 Pelvic and perineal pain: Secondary | ICD-10-CM

## 2021-07-18 DIAGNOSIS — O26892 Other specified pregnancy related conditions, second trimester: Secondary | ICD-10-CM

## 2021-07-18 DIAGNOSIS — O26852 Spotting complicating pregnancy, second trimester: Secondary | ICD-10-CM

## 2021-07-18 DIAGNOSIS — O4692 Antepartum hemorrhage, unspecified, second trimester: Secondary | ICD-10-CM | POA: Insufficient documentation

## 2021-07-18 DIAGNOSIS — Z3A15 15 weeks gestation of pregnancy: Secondary | ICD-10-CM

## 2021-07-18 DIAGNOSIS — R109 Unspecified abdominal pain: Secondary | ICD-10-CM | POA: Diagnosis not present

## 2021-07-18 DIAGNOSIS — O209 Hemorrhage in early pregnancy, unspecified: Secondary | ICD-10-CM

## 2021-07-18 DIAGNOSIS — O0990 Supervision of high risk pregnancy, unspecified, unspecified trimester: Secondary | ICD-10-CM

## 2021-07-18 LAB — URINALYSIS, ROUTINE W REFLEX MICROSCOPIC
Bacteria, UA: NONE SEEN
Bilirubin Urine: NEGATIVE
Glucose, UA: NEGATIVE mg/dL
Ketones, ur: NEGATIVE mg/dL
Leukocytes,Ua: NEGATIVE
Nitrite: NEGATIVE
Protein, ur: NEGATIVE mg/dL
Specific Gravity, Urine: 1.027 (ref 1.005–1.030)
pH: 5 (ref 5.0–8.0)

## 2021-07-18 NOTE — MAU Provider Note (Signed)
History     CSN: [id number]  Arrival date and time: 07/18/21 1738   Event Date/Time   First Provider Initiated Contact with Patient 07/18/21 1900      Chief Complaint  Patient presents with   Vaginal Bleeding   Cramping   Alexis Conley is a 20 y.o. G1P0 at [redacted]w[redacted]d who receives care at [facility].  She presents today for Vaginal Bleeding and Cramping.  She states she  noted vaginal bleeding that started around 1630.  She states it was noted in her underwear and with wiping and describes it as "a little red in my underwear, but brown with wiping." She also reports cramping that started after the bleeding, but is not present currently.  She reports the cramping was "right under my stomach." She denies relieving or aggravating factors that she noted during the time of symptoms.  She states the cramping was a 3-4/10 when it was present.  Patient denies issues with urination, constipation, or diarrhea.  She also denies recent sexual activity.  She reports recent diagnosis and treatment for BV last Thursday.      OB History     Gravida  1   Para      Term      Preterm      AB      Living         SAB      IAB      Ectopic      Multiple      Live Births              Past Medical History:  Diagnosis Date   TB lung, latent     Past Surgical History:  Procedure Laterality Date   NO PAST SURGERIES      Family History  Problem Relation Age of Onset   Healthy Mother    Healthy Father     Social History   Tobacco Use   Smoking status: Never   Smokeless tobacco: Never  Vaping Use   Vaping Use: Never used  Substance Use Topics   Alcohol use: No   Drug use: No    Allergies:  Allergies  Allergen Reactions   Peanut-Containing Drug Products Anaphylaxis   Shellfish Allergy Anaphylaxis   Tomato Hives   Mushroom Extract Complex     Childhood allergy.... Does not know reaction.     Medications Prior to Admission  Medication Sig Dispense Refill Last Dose    Prenatal Vit-Fe Fumarate-FA (PREPLUS) 27-1 MG TABS Take 1 tablet by mouth daily. 30 tablet 6 07/17/2021    Review of Systems  Constitutional:  Negative for chills and fever.  Genitourinary:  Positive for vaginal bleeding and vaginal discharge. Negative for difficulty urinating and dysuria.  Neurological:  Negative for dizziness, light-headedness and headaches.  Physical Exam   Blood pressure 118/62, pulse 81, temperature 97.8 F (36.6 C), temperature source Oral, resp. rate 20, height 5\' 2"  (1.575 m), weight 75.1 kg, last menstrual period 03/29/2021, SpO2 100 %.  Physical Exam Vitals reviewed. Exam conducted with a chaperone present.  Constitutional:      Appearance: Normal appearance.  HENT:     Head: Normocephalic and atraumatic.  Eyes:     Conjunctiva/sclera: Conjunctivae normal.  Cardiovascular:     Rate and Rhythm: Normal rate.  Pulmonary:     Effort: Pulmonary effort is normal. No respiratory distress.  Abdominal:     General: Bowel sounds are normal.     Palpations: Abdomen is soft.  Tenderness: There is abdominal tenderness.  Genitourinary:    Comments: Speculum Exam: -Normal External Genitalia: Non tender, Moderate amt brownish-gray discharge at introitus.  -Vaginal Vault: Pink mucosa with good rugae. Small amt brownish red discharge, small pea sized clot. Removed with faux swab x 3  -Cervix: Pink, no lesions, cysts, or polyps.  Appears closed. No active bleeding, but brownish-yellow blood streaked mucoid discharge from os- -Bimanual Exam:  Closed.  Uterine tenderness in LUQ. Uterine s>d   Skin:    General: Skin is warm and dry.  Neurological:     Mental Status: She is alert and oriented to person, place, and time.  Psychiatric:        Mood and Affect: Mood normal.        Behavior: Behavior normal.        Thought Content: Thought content normal.    MAU Course  Procedures Results for orders placed or performed during the hospital encounter of 07/18/21  (from the past 24 hour(s))  Urinalysis, Routine w reflex microscopic Urine, Clean Catch     Status: Abnormal   Collection Time: 07/18/21  6:20 PM  Result Value Ref Range   Color, Urine YELLOW YELLOW   APPearance HAZY (A) CLEAR   Specific Gravity, Urine 1.027 1.005 - 1.030   pH 5.0 5.0 - 8.0   Glucose, UA NEGATIVE NEGATIVE mg/dL   Hgb urine dipstick MODERATE (A) NEGATIVE   Bilirubin Urine NEGATIVE NEGATIVE   Ketones, ur NEGATIVE NEGATIVE mg/dL   Protein, ur NEGATIVE NEGATIVE mg/dL   Nitrite NEGATIVE NEGATIVE   Leukocytes,Ua NEGATIVE NEGATIVE   RBC / HPF 0-5 0 - 5 RBC/hpf   WBC, UA 0-5 0 - 5 WBC/hpf   Bacteria, UA NONE SEEN NONE SEEN   Squamous Epithelial / LPF 6-10 0 - 5   Mucus PRESENT    Ca Oxalate Crys, UA PRESENT     [place] MFM OB LIMITED  Result Date: 07/18/2021 ----------------------------------------------------------------------  OBSTETRICS REPORT                       (Signed Final 07/18/2021 08:14 pm) ---------------------------------------------------------------------- Patient Info  ID #:       [id number]                          D.O.B.:  2000/11/18 (20 yrs)  Name:       Alexis Conley                 Visit Date: 07/18/2021 07:10 pm ---------------------------------------------------------------------- Performed By  Attending:        Johnell Comings MD         Referred By:       [facility] MAU/Triage  Performed By:     Hubert Azure          [facility]                    [place] ---------------------------------------------------------------------- Orders  #  Description                           Code  Ordered By  1  [place] MFM OB LIMITED                     [id number]    Denim Kalmbach ----------------------------------------------------------------------  #  Order #                     Accession #                Episode #  1  [id number]                   [id number]                 [id number]  ---------------------------------------------------------------------- Indications  Vaginal bleeding in pregnancy, second           O46.92  trimester  [redacted] weeks gestation of pregnancy                 Z3A.15  Pelvic pain affecting pregnancy in second       O26.892  trimester ---------------------------------------------------------------------- Vital Signs  Weight (lb): 165                               Height:        5'2"  BMI:         30.18 ---------------------------------------------------------------------- Fetal Evaluation  Num Of Fetuses:          1  Fetal Heart Rate(bpm):   144  Cardiac Activity:        Observed  Presentation:            Cephalic  Placenta:                Posterior  P. Cord Insertion:       Visualized, central  Amniotic Fluid  AFI FV:      Within normal limits  Comment:    ? subchorionic hemorrhage noted. (3.6 x1.4x 3.7cm) ---------------------------------------------------------------------- OB History  Gravidity:    1 ---------------------------------------------------------------------- Gestational Age  LMP:           15w 6d        Date:  03/29/21                 EDD:   01/03/22  Best:          Neena Rhymes 6d     Det. By:  LMP  (03/29/21)          EDD:   01/03/22 ---------------------------------------------------------------------- Cervix Uterus Adnexa  Cervix  Length:           3.46  cm.  Normal appearance by transabdominal scan.  Uterus  No abnormality visualized.  Right Ovary  Within normal limits.  Left Ovary  Not visualized.  Adnexa  No abnormality visualized. ---------------------------------------------------------------------- Comments  This patient presented to the MAU due to vaginal spotting  and abdominal cramping.  A limited ultrasound performed today shows that the fetus is  in the vertex presentation.  There was normal amniotic fluid noted.  A posterior placenta is noted.  A possible 3-4 cm anterior subchorionic hematoma is noted.  This finding may also represent an anterior fibroid.   There were no signs of a hematoma noted near her cervix.  She should have a detailed ultrasound scheduled in the MFM  office at around 19 weeks. ----------------------------------------------------------------------  Johnell Comings, MD Electronically Signed Final Report   07/18/2021 08:14 pm ----------------------------------------------------------------------   MDM Pelvic Exam Ultrasound Assessment and Plan  20 year old, G1P0  SIUP at 15.6 weeks Vaginal Discharge/Spotting Abdominal Cramping  -Reviewed POC with patient. -Exam performed and findings discussed.  -Patient offered and declines pain medication. -Will send for formal US and await results.  Maryann Conners 07/18/2021, 7:00 PM   Reassessment (7:49 PM) -Korea Tech, Kelly, calls and reports concern with area concerning for potential placental bleed. -Dr. Geroge Baseman consulted and updated on patient status, results, and [place] Tech concern.  Will provide official read tonight. -Will await results.   Reassessment (8:21 PM)  -Dr. Annamaria Boots returns call and reports as above.  Advises patient follow up in office for repeat US in 3-4 weeks. -Provider to bedside to discuss results with patient. -Reassured that infant and placenta appear well per MFM report. -Patient reports next [place] already scheduled. -Bleeding Precautions given. -Advised pelvic rest until next [place]. -Encouraged to call primary office or return to MAU if symptoms worsen or with the onset of new symptoms. -Discharged to home in stable condition.  Maryann Conners MSN, CNM Advanced Practice Provider, [facility] [employer] [facility]

## 2021-07-18 NOTE — MAU Note (Signed)
Presents stating she's spotting brown discharge and having abdominal cramps that began late afternoon. Reports spotting with wiping.  Denies recent intercourse.

## 2021-08-01 ENCOUNTER — Other Ambulatory Visit: Payer: Self-pay

## 2021-08-01 ENCOUNTER — Ambulatory Visit (INDEPENDENT_AMBULATORY_CARE_PROVIDER_SITE_OTHER): Payer: 59 | Admitting: Family Medicine

## 2021-08-01 VITALS — BP 117/71 | HR 80 | Wt 169.2 lb

## 2021-08-01 DIAGNOSIS — Z227 Latent tuberculosis: Secondary | ICD-10-CM

## 2021-08-01 DIAGNOSIS — O0990 Supervision of high risk pregnancy, unspecified, unspecified trimester: Secondary | ICD-10-CM

## 2021-08-01 NOTE — Progress Notes (Signed)
   Subjective:  Alexis Conley is a 20 y.o. G1P0 at [redacted]w[redacted]d being seen today for ongoing prenatal care.  She is currently monitored for the following issues for this low-risk pregnancy and has Supervision of high risk pregnancy, antepartum and TB lung, latent on their problem list.  Patient reports no complaints.  Contractions: Not present. Vag. Bleeding: None.  Movement: Present. Denies leaking of fluid.   The following portions of the patient's history were reviewed and updated as appropriate: allergies, current medications, past family history, past medical history, past social history, past surgical history and problem list. Problem list updated.  Objective:   Vitals:   08/01/21 1104  BP: 117/71  Pulse: 80  Weight: 169 lb 3.2 oz (76.7 kg)    Fetal Status: Fetal Heart Rate (bpm): 140   Movement: Present     General:  Alert, oriented and cooperative. Patient is in no acute distress.  Skin: Skin is warm and dry. No rash noted.   Cardiovascular: Normal heart rate noted  Respiratory: Normal respiratory effort, no problems with respiration noted  Abdomen: Soft, gravid, appropriate for gestational age. Pain/Pressure: Absent     Pelvic: Vag. Bleeding: None     Cervical exam deferred        Extremities: Normal range of motion.  Edema: None  Mental Status: Normal mood and affect. Normal behavior. Normal judgment and thought content.   Urinalysis:      Assessment and Plan:  Pregnancy: G1P0 at [redacted]w[redacted]d  1. Supervision of high risk pregnancy, antepartum BP and FHR normal AFP today - AFP, Serum, Open Spina Bifida  2. 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 Please refer to After Visit Summary for other counseling recommendations.  Return in 4 weeks (on 08/29/2021) for Dyad patient, ob visit.   Clarnce Flock, MD

## 2021-08-01 NOTE — Patient Instructions (Signed)

## 2021-08-03 ENCOUNTER — Telehealth: Payer: Self-pay | Admitting: Lactation Services

## 2021-08-03 LAB — AFP, SERUM, OPEN SPINA BIFIDA
AFP MoM: 1.26
AFP Value: 55.3 ng/mL
Gest. Age on Collection Date: 17.6 weeks
Maternal Age At EDD: 23.1 yr
OSBR Risk 1 IN: 10000
Test Results:: NEGATIVE
Weight: 169 [lb_av]

## 2021-08-03 NOTE — Telephone Encounter (Signed)
Called patient at request of Georgina Quint, Prenatal Navigator to discuss breast feeding with patient. LM for patient to call the office at her convenience to speak with Lactation or to check her My Chart message.

## 2021-08-09 ENCOUNTER — Other Ambulatory Visit: Payer: Self-pay

## 2021-08-09 ENCOUNTER — Ambulatory Visit: Payer: 59 | Attending: Family Medicine

## 2021-08-09 ENCOUNTER — Ambulatory Visit: Payer: 59 | Admitting: *Deleted

## 2021-08-09 ENCOUNTER — Other Ambulatory Visit: Payer: Self-pay | Admitting: *Deleted

## 2021-08-09 ENCOUNTER — Encounter: Payer: Self-pay | Admitting: *Deleted

## 2021-08-09 VITALS — BP 126/68 | HR 104

## 2021-08-09 DIAGNOSIS — O2692 Pregnancy related conditions, unspecified, second trimester: Secondary | ICD-10-CM | POA: Diagnosis not present

## 2021-08-09 DIAGNOSIS — Z363 Encounter for antenatal screening for malformations: Secondary | ICD-10-CM | POA: Diagnosis not present

## 2021-08-09 DIAGNOSIS — O4592 Premature separation of placenta, unspecified, second trimester: Secondary | ICD-10-CM

## 2021-08-09 DIAGNOSIS — Z3A19 19 weeks gestation of pregnancy: Secondary | ICD-10-CM | POA: Insufficient documentation

## 2021-08-09 DIAGNOSIS — O0990 Supervision of high risk pregnancy, unspecified, unspecified trimester: Secondary | ICD-10-CM | POA: Diagnosis present

## 2021-08-09 DIAGNOSIS — O0992 Supervision of high risk pregnancy, unspecified, second trimester: Secondary | ICD-10-CM | POA: Diagnosis not present

## 2021-08-09 DIAGNOSIS — O418X20 Other specified disorders of amniotic fluid and membranes, second trimester, not applicable or unspecified: Secondary | ICD-10-CM

## 2021-09-03 ENCOUNTER — Ambulatory Visit (INDEPENDENT_AMBULATORY_CARE_PROVIDER_SITE_OTHER): Payer: 59 | Admitting: Family Medicine

## 2021-09-03 ENCOUNTER — Other Ambulatory Visit: Payer: Self-pay

## 2021-09-03 ENCOUNTER — Telehealth: Payer: Self-pay | Admitting: Lactation Services

## 2021-09-03 VITALS — BP 123/73 | HR 99 | Wt 175.8 lb

## 2021-09-03 DIAGNOSIS — O0990 Supervision of high risk pregnancy, unspecified, unspecified trimester: Secondary | ICD-10-CM

## 2021-09-03 NOTE — Progress Notes (Signed)
   PRENATAL VISIT NOTE  Subjective:  Alexis Conley is a 20 y.o. G1P0 at [redacted]w[redacted]d being seen today for ongoing prenatal care.  She is currently monitored for the following issues for this low-risk pregnancy and has Supervision of high risk pregnancy, antepartum and TB lung, latent on their problem list.  Patient reports no complaints. Mild SOB esp when walking upstairs. Contractions: Not present. Vag. Bleeding: None.  Movement: Present. Denies leaking of fluid.   The following portions of the patient's history were reviewed and updated as appropriate: allergies, current medications, past family history, past medical history, past social history, past surgical history and problem list.   Objective:   Vitals:   09/03/21 1554  BP: 123/73  Pulse: 99  Weight: 175 lb 12.8 oz (79.7 kg)    Fetal Status: Fetal Heart Rate (bpm): 143 Fundal Height: 24 cm Movement: Present     General:  Alert, oriented and cooperative. Patient is in no acute distress.  Skin: Skin is warm and dry. No rash noted.   Cardiovascular: Normal heart rate noted  Respiratory: Normal respiratory effort, no problems with respiration noted  Abdomen: Soft, gravid, appropriate for gestational age.  Pain/Pressure: Absent     Pelvic: Cervical exam deferred        Extremities: Normal range of motion.  Edema: None  Mental Status: Normal mood and affect. Normal behavior. Normal judgment and thought content.   Assessment and Plan:  Pregnancy: G1P0 at [redacted]w[redacted]d 1. Supervision of high risk pregnancy, antepartum UTD  Measuring normally Dicussed SOB which is normal at this GA-- having diaphragm compression  Preterm labor symptoms and general obstetric precautions including but not limited to vaginal bleeding, contractions, leaking of fluid and fetal movement were reviewed in detail with the patient. Please refer to After Visit Summary for other counseling recommendations.   Return in about 4 weeks (around 10/01/2021) for Routine prenatal  care, Dual Care-MB Dyad.  Future Appointments  Date Time Provider [place]  10/04/2021  9:45 AM [facility] NURSE [facility]  10/04/2021 10:00 AM [facility] US1 [facility]  10/08/2021  8:15 AM MOMBABYDYAD [facility]  10/08/2021  8:50 AM WMC-WOCA LAB [facility]  10/31/2021  3:15 PM MOMBABYDYAD [facility]  11/14/2021  3:15 PM MOMBABYDYAD [facility]-MBD [place]    Caren Macadam, MD

## 2021-09-03 NOTE — Telephone Encounter (Signed)
Called patient to inform her of Horizon Carrier Screening results showing she is a silent carrier for Alpha Thalassemia.   Patient did not answer. LM for her to call the office at [phone] for non urgent results. Advised to check My Chart Message.

## 2021-09-04 ENCOUNTER — Encounter: Payer: Self-pay | Admitting: Family Medicine

## 2021-09-04 DIAGNOSIS — D563 Thalassemia minor: Secondary | ICD-10-CM | POA: Insufficient documentation

## 2021-09-23 NOTE — L&D Delivery Note (Signed)
Delivery Note ?After about a 30 minute 2nd stage, at 10:40 AM a viable female was delivered via Vaginal, Spontaneous (Presentation: Left Occiput Anterior).  APGAR: 8, 9; weight pending. After 1 minute, the cord was clamped and cut. 40 units of pitocin diluted in 1000cc LR was infused rapidly IV.  The placenta separated spontaneously and delivered via CCT and maternal pushing effort.  It was inspected and appears to be intact with a 3 VC.  ? ?Anesthesia: Epidural ?Episiotomy: None ?Lacerations: 2nd degree ?Suture Repair: 3.0 vicryl ?Est. Blood Loss (mL):  350 ? ?Mom to postpartum.  Baby to Couplet care / Skin to Skin. ? ?Alexis Conley ?01/03/2022, 11:18 AM ? ? ? ?

## 2021-09-25 ENCOUNTER — Telehealth: Payer: Self-pay | Admitting: Family Medicine

## 2021-09-25 ENCOUNTER — Other Ambulatory Visit: Payer: Self-pay

## 2021-09-25 ENCOUNTER — Ambulatory Visit (INDEPENDENT_AMBULATORY_CARE_PROVIDER_SITE_OTHER): Payer: 59 | Admitting: Family Medicine

## 2021-09-25 VITALS — BP 132/80 | HR 109 | Wt 178.0 lb

## 2021-09-25 DIAGNOSIS — Z227 Latent tuberculosis: Secondary | ICD-10-CM

## 2021-09-25 DIAGNOSIS — B3731 Acute candidiasis of vulva and vagina: Secondary | ICD-10-CM

## 2021-09-25 DIAGNOSIS — D563 Thalassemia minor: Secondary | ICD-10-CM

## 2021-09-25 DIAGNOSIS — O0990 Supervision of high risk pregnancy, unspecified, unspecified trimester: Secondary | ICD-10-CM

## 2021-09-25 MED ORDER — FLUCONAZOLE 50 MG PO TABS: 150.0000 mg | ORAL_TABLET | Freq: Every day | ORAL | 1 refills | Status: DC

## 2021-09-25 NOTE — Telephone Encounter (Signed)
Patient state she woke up this morning and she was leaking clear liquid she said it mainly do it when she stand.

## 2021-09-25 NOTE — Telephone Encounter (Signed)
Call placed back to pt. Spoke with pt. Pt states having clear liquid discharge for 3 days. Pt states happens when standing up from a lying position and after urinating. Pt having intermittent cramps as well. Denies vaginal bleeding. +FM.  Pt advised to come to office for evaluation. Pt agreeable to plan of care.  Pt scheduled for  today 1/3 at 1055am.  Dr Dione Plover aware of appt.  Colletta Maryland, RN

## 2021-09-25 NOTE — Progress Notes (Signed)
° °  Subjective:  Alexis Conley is a 21 y.o. G1P0 at 81w5dbeing seen today for ongoing prenatal care.  She is currently monitored for the following issues for this low-risk pregnancy and has Supervision of high risk pregnancy, antepartum; TB lung, latent; and Alpha thalassemia silent carrier on their problem list.  Patient reports  leaking fluids for several days .  Contractions: Irritability. Vag. Bleeding: None.  Movement: Present. Denies leaking of fluid.   The following portions of the patient's history were reviewed and updated as appropriate: allergies, current medications, past family history, past medical history, past social history, past surgical history and problem list. Problem list updated.  Objective:   Vitals:   09/25/21 1107  BP: 132/80  Pulse: (!) 109  Weight: 178 lb (80.7 kg)    Fetal Status: Fetal Heart Rate (bpm): 145   Movement: Present     General:  Alert, oriented and cooperative. Patient is in no acute distress.  Skin: Skin is warm and dry. No rash noted.   Cardiovascular: Normal heart rate noted  Respiratory: Normal respiratory effort, no problems with respiration noted  Abdomen: Soft, gravid, appropriate for gestational age. Pain/Pressure: Present     Pelvic: Vag. Bleeding: None     Cervical exam deferred        Extremities: Normal range of motion.  Edema: None  Mental Status: Normal mood and affect. Normal behavior. Normal judgment and thought content.   Urinalysis:      Assessment and Plan:  Pregnancy: G1P0 at 211w5d1. Supervision of high risk pregnancy, antepartum BP and FHR normal 28wk labs and TDaP next visit, reminded to come fasting Reported leaking fluid, spec exam with large amount of clumpy white discharge consistent with yeast infection, neg pool, neg valsalva Bedside [place] done which showed subjectively normal fluid with multiple large pockets Overall very low suspicion for ROM, empiric treatment sent for yeast vaginitis  2. Alpha  thalassemia silent carrier Offered partner testing, accepts, given partner testing kit  3. TB lung, latent Hx of treatment w Rifampin  Preterm labor symptoms and general obstetric precautions including but not limited to vaginal bleeding, contractions, leaking of fluid and fetal movement were reviewed in detail with the patient. Please refer to After Visit Summary for other counseling recommendations.  No follow-ups on file.   EcClarnce FlockMD

## 2021-10-04 ENCOUNTER — Ambulatory Visit: Payer: 59

## 2021-10-05 ENCOUNTER — Encounter: Payer: Self-pay | Admitting: *Deleted

## 2021-10-05 ENCOUNTER — Other Ambulatory Visit: Payer: Self-pay

## 2021-10-05 ENCOUNTER — Ambulatory Visit: Payer: 59 | Admitting: *Deleted

## 2021-10-05 ENCOUNTER — Ambulatory Visit: Payer: 59 | Attending: Obstetrics and Gynecology

## 2021-10-05 VITALS — BP 128/61 | HR 97

## 2021-10-05 DIAGNOSIS — O0990 Supervision of high risk pregnancy, unspecified, unspecified trimester: Secondary | ICD-10-CM | POA: Diagnosis not present

## 2021-10-05 DIAGNOSIS — O468X2 Other antepartum hemorrhage, second trimester: Secondary | ICD-10-CM | POA: Insufficient documentation

## 2021-10-05 DIAGNOSIS — Z3A27 27 weeks gestation of pregnancy: Secondary | ICD-10-CM | POA: Diagnosis not present

## 2021-10-05 DIAGNOSIS — Z362 Encounter for other antenatal screening follow-up: Secondary | ICD-10-CM | POA: Diagnosis not present

## 2021-10-05 DIAGNOSIS — O418X20 Other specified disorders of amniotic fluid and membranes, second trimester, not applicable or unspecified: Secondary | ICD-10-CM | POA: Insufficient documentation

## 2021-10-08 ENCOUNTER — Ambulatory Visit (INDEPENDENT_AMBULATORY_CARE_PROVIDER_SITE_OTHER): Payer: 59 | Admitting: Family Medicine

## 2021-10-08 ENCOUNTER — Other Ambulatory Visit: Payer: Self-pay

## 2021-10-08 ENCOUNTER — Other Ambulatory Visit: Payer: 59

## 2021-10-08 DIAGNOSIS — O0990 Supervision of high risk pregnancy, unspecified, unspecified trimester: Secondary | ICD-10-CM

## 2021-10-08 DIAGNOSIS — Z227 Latent tuberculosis: Secondary | ICD-10-CM

## 2021-10-08 NOTE — Progress Notes (Signed)
° °  Mom + Baby Combined care PRENATAL VISIT NOTE  Subjective:  Alexis Conley is a 21 y.o. G1P0 at [redacted]w[redacted]d being seen today for ongoing prenatal care.  She is currently monitored for the following issues for this low-risk pregnancy and has Supervision of high risk pregnancy, antepartum; TB lung, latent; and Alpha thalassemia silent carrier on their problem list.  Patient reports no complaints.  Contractions: Not present. Vag. Bleeding: None.  Movement: Present. Denies leaking of fluid.   The following portions of the patient's history were reviewed and updated as appropriate: allergies, current medications, past family history, past medical history, past social history, past surgical history and problem list.   Objective:   Vitals:   10/08/21 0823  BP: 119/76  Pulse: 80  Weight: 179 lb (81.2 kg)    Fetal Status: Fetal Heart Rate (bpm): 145   Movement: Present     General:  Alert, oriented and cooperative. Patient is in no acute distress.  Skin: Skin is warm and dry. No rash noted.   Cardiovascular: Normal heart rate noted  Respiratory: Normal respiratory effort, no problems with respiration noted  Abdomen: Soft, gravid, appropriate for gestational age.  Pain/Pressure: Present     Pelvic: Cervical exam deferred        Extremities: Normal range of motion.  Edema: None  Mental Status: Normal mood and affect. Normal behavior. Normal judgment and thought content.   Assessment and Plan:  Pregnancy: G1P0 at [redacted]w[redacted]d  1. Supervision of high risk pregnancy, antepartum Up to date Completing lab work today  2. TB lung, latent - Counseled on 10/08/21 about safety of INH and Rifampin. Desired postpartum INH through [facility]. She is OK with nausea SE- Counseled on 10/08/21 about safety of INH and Rifampin. Desired postpartum INH through [facility]. She is OK with nausea SE  Preterm labor symptoms and general obstetric precautions including but not limited to vaginal bleeding, contractions, leaking of fluid  and fetal movement were reviewed in detail with the patient. Please refer to After Visit Summary for other counseling recommendations.   No follow-ups on file.  Future Appointments  Date Time Provider [place]  10/31/2021  3:15 PM [facility]  11/14/2021  3:15 PM [facility]  12/03/2021  8:55 AM [facility]  12/10/2021  8:35 AM [facility]  12/18/2021  3:55 PM [facility] [place]    Caren Macadam, MD

## 2021-10-09 ENCOUNTER — Encounter (HOSPITAL_COMMUNITY): Payer: Self-pay | Admitting: Obstetrics & Gynecology

## 2021-10-09 ENCOUNTER — Inpatient Hospital Stay (HOSPITAL_COMMUNITY)
Admission: AD | Admit: 2021-10-09 | Discharge: 2021-10-09 | Disposition: A | Discharge status: Home / Self Care | Payer: 59 | Attending: Obstetrics & Gynecology | Admitting: Obstetrics & Gynecology

## 2021-10-09 DIAGNOSIS — O26892 Other specified pregnancy related conditions, second trimester: Secondary | ICD-10-CM | POA: Insufficient documentation

## 2021-10-09 DIAGNOSIS — O26899 Other specified pregnancy related conditions, unspecified trimester: Secondary | ICD-10-CM

## 2021-10-09 DIAGNOSIS — O4692 Antepartum hemorrhage, unspecified, second trimester: Secondary | ICD-10-CM | POA: Diagnosis not present

## 2021-10-09 DIAGNOSIS — Z3689 Encounter for other specified antenatal screening: Secondary | ICD-10-CM | POA: Insufficient documentation

## 2021-10-09 DIAGNOSIS — Z3A27 27 weeks gestation of pregnancy: Secondary | ICD-10-CM | POA: Insufficient documentation

## 2021-10-09 DIAGNOSIS — O0992 Supervision of high risk pregnancy, unspecified, second trimester: Secondary | ICD-10-CM | POA: Diagnosis present

## 2021-10-09 DIAGNOSIS — R109 Unspecified abdominal pain: Secondary | ICD-10-CM | POA: Diagnosis not present

## 2021-10-09 DIAGNOSIS — O0990 Supervision of high risk pregnancy, unspecified, unspecified trimester: Secondary | ICD-10-CM

## 2021-10-09 HISTORY — DX: Cardiac murmur, unspecified: R01.1

## 2021-10-09 HISTORY — DX: Dermatitis, unspecified: L30.9

## 2021-10-09 LAB — CBC
Hematocrit: 33.8 % — ABNORMAL LOW (ref 34.0–46.6)
Hemoglobin: 11.4 g/dL (ref 11.1–15.9)
MCH: 27.1 pg (ref 26.6–33.0)
MCHC: 33.7 g/dL (ref 31.5–35.7)
MCV: 81 fL (ref 79–97)
Platelets: 196 10*3/uL (ref 150–450)
RBC: 4.2 x10E6/uL (ref 3.77–5.28)
RDW: 13.7 % (ref 11.7–15.4)
WBC: 7.9 10*3/uL (ref 3.4–10.8)

## 2021-10-09 LAB — URINALYSIS, ROUTINE W REFLEX MICROSCOPIC
Bilirubin Urine: NEGATIVE
Glucose, UA: NEGATIVE mg/dL
Ketones, ur: NEGATIVE mg/dL
Nitrite: NEGATIVE
Protein, ur: NEGATIVE mg/dL
Specific Gravity, Urine: 1.025 (ref 1.005–1.030)
pH: 7 (ref 5.0–8.0)

## 2021-10-09 LAB — WET PREP, GENITAL
Clue Cells Wet Prep HPF POC: NONE SEEN
Sperm: NONE SEEN
Trich, Wet Prep: NONE SEEN
WBC, Wet Prep HPF POC: >=10 — AB (ref <10)
Yeast Wet Prep HPF POC: NONE SEEN

## 2021-10-09 LAB — URINALYSIS, MICROSCOPIC (REFLEX)

## 2021-10-09 LAB — GLUCOSE TOLERANCE, 2 HOURS W/ 1HR
Glucose, 1 hour: 81 mg/dL (ref 70–179)
Glucose, 2 hour: 91 mg/dL (ref 70–152)
Glucose, Fasting: 77 mg/dL (ref 70–91)

## 2021-10-09 LAB — HIV ANTIBODY (ROUTINE TESTING W REFLEX): HIV Screen 4th Generation wRfx: NONREACTIVE

## 2021-10-09 LAB — RPR: RPR Ser Ql: NONREACTIVE

## 2021-10-09 MED ORDER — ACETAMINOPHEN 500 MG PO TABS
1000.0000 mg | ORAL_TABLET | Freq: Four times a day (QID) | ORAL | Status: DC | PRN
  Administered 2021-10-09: 1000 mg via ORAL
  Filled 2021-10-09: qty 2

## 2021-10-09 NOTE — MAU Provider Note (Signed)
History     CSN: [id number]  Arrival date and time: 10/09/21 8299   Event Date/Time   First Provider Initiated Contact with Patient 10/09/21 [phone]      Chief Complaint  Patient presents with   Vaginal Bleeding   Abdominal Pain   21 y.o. G1 @27 .5 wks presenting with cramping and VB. Reports onset of bleeding when she woke. Saw blood on the toilet paper. No been enough for a pad. Bleeding has been less since. Cramping started last night. Pain is intermittent. Rates pain 4/10. Has not treated it. No recent sex. Denies urinary sx.   OB History     Gravida  1   Para      Term      Preterm      AB      Living         SAB      IAB      Ectopic      Multiple      Live Births              Past Medical History:  Diagnosis Date   Eczema    Heart murmur    When younger   TB lung, latent     Past Surgical History:  Procedure Laterality Date   NO PAST SURGERIES      Family History  Problem Relation Age of Onset   Healthy Mother    Healthy Father     Social History   Tobacco Use   Smoking status: Never   Smokeless tobacco: Never  Vaping Use   Vaping Use: Never used  Substance Use Topics   Alcohol use: No   Drug use: No    Allergies:  Allergies  Allergen Reactions   Peanut-Containing Drug Products Anaphylaxis   Shellfish Allergy Anaphylaxis   Tomato Hives   Mushroom Extract Complex     Childhood allergy.... Does not know reaction.     Medications Prior to Admission  Medication Sig Dispense Refill Last Dose   Prenatal Vit-Fe Fumarate-FA (PREPLUS) 27-1 MG TABS Take 1 tablet by mouth daily. 30 tablet 6 Past Week    Review of Systems  Gastrointestinal:  Positive for abdominal pain and nausea. Negative for constipation, diarrhea and vomiting.  Genitourinary:  Positive for vaginal bleeding. Negative for dysuria, frequency and urgency.  Physical Exam   Blood pressure (!) 117/58, pulse 99, temperature 98.7 F (37.1 C), temperature source  Oral, resp. rate 20, height 5\' 2"  (1.575 m), weight 79.3 kg, last menstrual period 03/29/2021, SpO2 100 %.  Physical Exam Vitals and nursing note reviewed. Exam conducted with a chaperone present.  Constitutional:      General: She is not in acute distress.    Appearance: Normal appearance.  Cardiovascular:     Rate and Rhythm: Normal rate.  Pulmonary:     Effort: Pulmonary effort is normal. No respiratory distress.  Abdominal:     Palpations: Abdomen is soft.     Tenderness: There is no abdominal tenderness.  Genitourinary:    Comments: External: no lesions or erythema Vagina: rugated, pink, moist, scant creamy discharge, small cervical bleeding after contact with q-tip Cervix closed/thick   Musculoskeletal:        General: Normal range of motion.     Cervical back: Normal range of motion.  Skin:    General: Skin is warm and dry.  Neurological:     General: No focal deficit present.     Mental Status: She is  alert and oriented to person, place, and time.  Psychiatric:        Mood and Affect: Mood normal.        Behavior: Behavior normal.  EFM: 140 bpm, mod variability, + accels, no decels Toco: UI  Results for orders placed or performed during the hospital encounter of 10/09/21 (from the past 24 hour(s))  Urinalysis, Routine w reflex microscopic Urine, Clean Catch     Status: Abnormal   Collection Time: 10/09/21  9:16 AM  Result Value Ref Range   Color, Urine YELLOW YELLOW   APPearance CLEAR CLEAR   Specific Gravity, Urine 1.025 1.005 - 1.030   pH 7.0 5.0 - 8.0   Glucose, UA NEGATIVE NEGATIVE mg/dL   Hgb urine dipstick MODERATE (A) NEGATIVE   Bilirubin Urine NEGATIVE NEGATIVE   Ketones, ur NEGATIVE NEGATIVE mg/dL   Protein, ur NEGATIVE NEGATIVE mg/dL   Nitrite NEGATIVE NEGATIVE   Leukocytes,Ua MODERATE (A) NEGATIVE  Urinalysis, Microscopic (reflex)     Status: Abnormal   Collection Time: 10/09/21  9:16 AM  Result Value Ref Range   RBC / HPF 0-5 0 - 5 RBC/hpf    WBC, UA 21-50 0 - 5 WBC/hpf   Bacteria, UA MANY (A) NONE SEEN   Squamous Epithelial / LPF 11-20 0 - 5   Mucus PRESENT   Wet prep, genital     Status: Abnormal   Collection Time: 10/09/21  9:55 AM   Specimen: Vaginal  Result Value Ref Range   Yeast Wet Prep HPF POC NONE SEEN NONE SEEN   Trich, Wet Prep NONE SEEN NONE SEEN   Clue Cells Wet Prep HPF POC NONE SEEN NONE SEEN   WBC, Wet Prep HPF POC >=10 (A) <10   Sperm NONE SEEN    MAU Course  Procedures Tylenol  MDM Labs ordered and reviewed. Pain improved with Tylenol. Dehydration on UA, discussed water intake recommendations. UA with bacteria and WBCs, pt denies sx, will send UC. GC pending. No signs of PTL. Stable for discharge home.   Assessment and Plan   1. Supervision of high risk pregnancy, antepartum   2. [redacted] weeks gestation of pregnancy   3. NST (non-stress test) reactive   4. Abdominal cramping affecting pregnancy    Discharge home Follow up at [facility] as scheduled Increase h2o intake PTL precautions  Allergies as of 10/09/2021       Reactions   Peanut-containing Drug Products Anaphylaxis   Shellfish Allergy Anaphylaxis   Tomato Hives   Mushroom Extract Complex    Childhood allergy.... Does not know reaction.         Medication List     TAKE these medications    PrePLUS 27-1 MG Tabs Take 1 tablet by mouth daily.       Julianne Handler, CNM 10/09/2021, 11:49 AM

## 2021-10-09 NOTE — MAU Note (Signed)
When she woke up she was bleeding, looked like a period when she wiped.  Has been cramping since last night.  Yesterday she had bad pain in her abd, twice. Denies recent intercourse. No placental issues on [place].

## 2021-10-10 LAB — GC/CHLAMYDIA PROBE AMP (~~LOC~~) NOT AT ARMC
Chlamydia: NEGATIVE
Comment: NEGATIVE
Comment: NORMAL
Neisseria Gonorrhea: NEGATIVE

## 2021-10-10 LAB — CULTURE, OB URINE: Culture: 10000 — AB

## 2021-10-12 ENCOUNTER — Encounter: Payer: Self-pay | Admitting: *Deleted

## 2021-10-31 ENCOUNTER — Encounter: Payer: Self-pay | Admitting: Family Medicine

## 2021-10-31 ENCOUNTER — Ambulatory Visit (INDEPENDENT_AMBULATORY_CARE_PROVIDER_SITE_OTHER): Payer: 59 | Admitting: Family Medicine

## 2021-10-31 ENCOUNTER — Other Ambulatory Visit: Payer: Self-pay

## 2021-10-31 VITALS — BP 105/68 | HR 79 | Wt 180.6 lb

## 2021-10-31 DIAGNOSIS — O0990 Supervision of high risk pregnancy, unspecified, unspecified trimester: Secondary | ICD-10-CM

## 2021-10-31 NOTE — Progress Notes (Signed)
° ° °  PRENATAL VISIT NOTE  Subjective:  Alexis Conley is a 21 y.o. G1P0 at [redacted]w[redacted]d being seen today for ongoing prenatal care.  She is currently monitored for the following issues for this low-risk pregnancy and has Supervision of high risk pregnancy, antepartum; TB lung, latent; and Alpha thalassemia silent carrier on their problem list.  Patient reports no complaints.  Contractions: Not present. Vag. Bleeding: None.  Movement: Present. Denies leaking of fluid.   The following portions of the patient's history were reviewed and updated as appropriate: allergies, current medications, past family history, past medical history, past social history, past surgical history and problem list.   Objective:   Vitals:   10/31/21 1542  BP: 105/68  Pulse: 79  Weight: 180 lb 9.6 oz (81.9 kg)    Fetal Status: Fetal Heart Rate (bpm): 150 Fundal Height: 31 cm Movement: Present     General:  Alert, oriented and cooperative. Patient is in no acute distress.  Skin: Skin is warm and dry. No rash noted.   Cardiovascular: Normal heart rate noted  Respiratory: Normal respiratory effort, no problems with respiration noted  Abdomen: Soft, gravid, appropriate for gestational age.  Pain/Pressure: Present     Pelvic: Cervical exam deferred        Extremities: Normal range of motion.  Edema: None  Mental Status: Normal mood and affect. Normal behavior. Normal judgment and thought content.   Assessment and Plan:  Pregnancy: G1P0 at [redacted]w[redacted]d  1. Supervision of high risk pregnancy, antepartum Reviewed pregnancy and labor Recommend CB education Reviewed visitor policy at [facility] Discussed negative results for partner screening Still unsure about contraception   Preterm labor symptoms and general obstetric precautions including but not limited to vaginal bleeding, contractions, leaking of fluid and fetal movement were reviewed in detail with the patient. Please refer to After Visit Summary for other counseling  recommendations.   Return in about 2 weeks (around 11/14/2021) for Routine prenatal care, scheduled visit.  Future Appointments  Date Time Provider [place]  11/14/2021  3:15 PM [facility]  12/03/2021  8:55 AM [facility]  12/10/2021  8:35 AM [facility]  12/18/2021  3:55 PM [facility] [place]    Caren Macadam, MD

## 2021-11-05 ENCOUNTER — Encounter: Payer: Self-pay | Admitting: Family Medicine

## 2021-11-14 ENCOUNTER — Ambulatory Visit (INDEPENDENT_AMBULATORY_CARE_PROVIDER_SITE_OTHER): Payer: 59 | Admitting: Family Medicine

## 2021-11-14 ENCOUNTER — Other Ambulatory Visit: Payer: Self-pay

## 2021-11-14 VITALS — BP 135/75 | HR 82 | Wt 183.6 lb

## 2021-11-14 DIAGNOSIS — D563 Thalassemia minor: Secondary | ICD-10-CM

## 2021-11-14 DIAGNOSIS — O0990 Supervision of high risk pregnancy, unspecified, unspecified trimester: Secondary | ICD-10-CM

## 2021-11-14 DIAGNOSIS — Z227 Latent tuberculosis: Secondary | ICD-10-CM

## 2021-11-14 NOTE — Progress Notes (Signed)
° ° °  Subjective:  Alexis Conley is a 21 y.o. G1P0 at [redacted]w[redacted]d being seen today for ongoing prenatal care.  She is currently monitored for the following issues for this low-risk pregnancy and has Supervision of high risk pregnancy, antepartum; TB lung, latent; and Alpha thalassemia silent carrier on their problem list.  Patient reports no complaints.  Contractions: Not present. Vag. Bleeding: None.  Movement: Present. Denies leaking of fluid.   The following portions of the patient's history were reviewed and updated as appropriate: allergies, current medications, past family history, past medical history, past social history, past surgical history and problem list. Problem list updated.  Objective:   Vitals:   11/14/21 1517  BP: 135/75  Pulse: 82  Weight: 183 lb 9.6 oz (83.3 kg)    Fetal Status: Fetal Heart Rate (bpm): 138   Movement: Present     General:  Alert, oriented and cooperative. Patient is in no acute distress.  Skin: Skin is warm and dry. No rash noted.   Cardiovascular: Normal heart rate noted  Respiratory: Normal respiratory effort, no problems with respiration noted  Abdomen: Soft, gravid, appropriate for gestational age. Pain/Pressure: Present     Pelvic: Vag. Bleeding: None     Cervical exam deferred        Extremities: Normal range of motion.  Edema: None  Mental Status: Normal mood and affect. Normal behavior. Normal judgment and thought content.   Urinalysis:      Assessment and Plan:  Pregnancy: G1P0 at [redacted]w[redacted]d  1. Supervision of high risk pregnancy, antepartum BP and FHR normal Discussed contraception, she is unsure, will review Bedsider and let us know  2. TB lung, latent Plan for treatment postpartum  3. Alpha thalassemia silent carrier Partner tested negative  Preterm labor symptoms and general obstetric precautions including but not limited to vaginal bleeding, contractions, leaking of fluid and fetal movement were reviewed in detail with the  patient. Please refer to After Visit Summary for other counseling recommendations.  Return in 2 weeks (on 11/28/2021) for Dyad patient, ob visit.   Clarnce Flock, MD

## 2021-11-14 NOTE — Patient Instructions (Signed)

## 2021-11-22 ENCOUNTER — Encounter: Payer: Self-pay | Admitting: Family Medicine

## 2021-11-27 ENCOUNTER — Inpatient Hospital Stay (HOSPITAL_COMMUNITY)
Admission: AD | Admit: 2021-11-27 | Discharge: 2021-11-27 | Disposition: A | Discharge status: Home / Self Care | Payer: 59 | Attending: Obstetrics & Gynecology | Admitting: Obstetrics & Gynecology

## 2021-11-27 ENCOUNTER — Other Ambulatory Visit: Payer: Self-pay

## 2021-11-27 ENCOUNTER — Encounter (HOSPITAL_COMMUNITY): Payer: Self-pay | Admitting: Obstetrics & Gynecology

## 2021-11-27 DIAGNOSIS — R102 Pelvic and perineal pain: Secondary | ICD-10-CM | POA: Diagnosis not present

## 2021-11-27 DIAGNOSIS — O26899 Other specified pregnancy related conditions, unspecified trimester: Secondary | ICD-10-CM

## 2021-11-27 DIAGNOSIS — Z3A34 34 weeks gestation of pregnancy: Secondary | ICD-10-CM | POA: Diagnosis not present

## 2021-11-27 DIAGNOSIS — Z0371 Encounter for suspected problem with amniotic cavity and membrane ruled out: Secondary | ICD-10-CM | POA: Insufficient documentation

## 2021-11-27 DIAGNOSIS — O26893 Other specified pregnancy related conditions, third trimester: Secondary | ICD-10-CM | POA: Insufficient documentation

## 2021-11-27 LAB — URINALYSIS, ROUTINE W REFLEX MICROSCOPIC
Bilirubin Urine: NEGATIVE
Glucose, UA: NEGATIVE mg/dL
Hgb urine dipstick: NEGATIVE
Ketones, ur: 20 mg/dL — AB
Nitrite: NEGATIVE
Protein, ur: 30 mg/dL — AB
Specific Gravity, Urine: 1.033 — ABNORMAL HIGH (ref 1.005–1.030)
pH: 5 (ref 5.0–8.0)

## 2021-11-27 LAB — WET PREP, GENITAL
Clue Cells Wet Prep HPF POC: NONE SEEN
Sperm: NONE SEEN
Trich, Wet Prep: NONE SEEN
WBC, Wet Prep HPF POC: >=10 — AB (ref <10)
Yeast Wet Prep HPF POC: NONE SEEN

## 2021-11-27 MED ORDER — CYCLOBENZAPRINE HCL 10 MG PO TABS
10.0000 mg | ORAL_TABLET | Freq: Two times a day (BID) | ORAL | 0 refills | Status: DC | PRN
Start: 2021-11-27 — End: 2022-02-05

## 2021-11-27 MED ORDER — CYCLOBENZAPRINE HCL 5 MG PO TABS
10.0000 mg | ORAL_TABLET | Freq: Once | ORAL | Status: AC
Start: 2021-11-27 — End: 2021-11-27
  Administered 2021-11-27: 10 mg via ORAL
  Filled 2021-11-27: qty 2

## 2021-11-27 NOTE — MAU Note (Addendum)
PT SAYS ON Sunday- SHE FELT PRESSURE- IN HER VAGINA -HURTS TO SIT  ?SAME NOW ?ON Monday NIGHT  AND TODAY - SAW CLEAR LIQUID ON UNDERWEAR   ?[facility] ?LAST SEX- JAN ?DENIES HSV ?FEELS [facility] ? ?

## 2021-11-27 NOTE — MAU Provider Note (Signed)
?History  ?  ? ?CSN: [id number] ? ?Arrival date and time: 11/27/21 2102 ? ? Event Date/Time  ? First Provider Initiated Contact with Patient 11/27/21 2159   ?  ? ?Chief Complaint  ?Patient presents with  ? Rupture of Membranes  ? ?HPI ?Alexis Conley is a 21 y.o. G1P0 at 57w5dwho presents with pelvic and vaginal pain. She states this has gotten worse over the last 2 days. She is tearful with pain and rates the pain a 7/10. She has not tried anything for the pain. She reports her labia are really painful as well. She reports 2 episodes of leaking over the last 2 days. She denies any bleeding. Reports normal fetal movement.  ? ?OB History   ? ? Gravida  ?1  ? Para  ?   ? Term  ?   ? Preterm  ?   ? AB  ?   ? Living  ?   ?  ? ? SAB  ?   ? IAB  ?   ? Ectopic  ?   ? Multiple  ?   ? Live Births  ?   ?   ?  ?  ? ? ?Past Medical History:  ?Diagnosis Date  ? Eczema   ? Heart murmur   ? When younger  ? TB lung, latent   ? ? ?Past Surgical History:  ?Procedure Laterality Date  ? NO PAST SURGERIES    ? ? ?Family History  ?Problem Relation Age of Onset  ? Healthy Mother   ? Healthy Father   ? ? ?Social History  ? ?Tobacco Use  ? Smoking status: Never  ? Smokeless tobacco: Never  ?Vaping Use  ? Vaping Use: Never used  ?Substance Use Topics  ? Alcohol use: No  ? Drug use: No  ? ? ?Allergies:  ?Allergies  ?Allergen Reactions  ? Peanut-Containing Drug Products Anaphylaxis  ? Shellfish Allergy Anaphylaxis  ? Tomato Hives  ? Mushroom Extract Complex   ?  Childhood allergy.... Does not know reaction.   ? ? ?Medications Prior to Admission  ?Medication Sig Dispense Refill Last Dose  ? Prenatal Vit-Fe Fumarate-FA (PREPLUS) 27-1 MG TABS Take 1 tablet by mouth daily. 30 tablet 6 11/26/2021  ? ? ?Review of Systems  ?Constitutional: Negative.  Negative for fatigue and fever.  ?HENT: Negative.    ?Respiratory: Negative.  Negative for shortness of breath.   ?Cardiovascular: Negative.  Negative for chest pain.  ?Gastrointestinal: Negative.   Negative for abdominal pain, constipation, diarrhea, nausea and vomiting.  ?Genitourinary:  Positive for pelvic pain and vaginal discharge. Negative for dysuria and vaginal bleeding.  ?Neurological: Negative.  Negative for dizziness and headaches.  ?Physical Exam  ? ?Blood pressure 135/75, pulse (!) 105, temperature 98 ?F (36.7 ?C), temperature source Oral, resp. rate 18, height '5\' 2"'$  (1.575 m), weight 83.9 kg, last menstrual period 03/29/2021. ? ?Physical Exam ?Vitals and nursing note reviewed.  ?Constitutional:   ?   General: She is not in acute distress. ?   Appearance: She is well-developed.  ?HENT:  ?   Head: Normocephalic.  ?Eyes:  ?   Pupils: Pupils are equal, round, and reactive to light.  ?Cardiovascular:  ?   Rate and Rhythm: Normal rate and regular rhythm.  ?   Heart sounds: Normal heart sounds.  ?Pulmonary:  ?   Effort: Pulmonary effort is normal. No respiratory distress.  ?   Breath sounds: Normal breath sounds.  ?Abdominal:  ?  General: Bowel sounds are normal. There is no distension.  ?   Palpations: Abdomen is soft.  ?   Tenderness: There is no abdominal tenderness.  ?Genitourinary: ?   Comments: Pelvic exam: Cervix pink, visually closed, without lesion, scant white creamy discharge, vaginal walls and external genitalia normal ?Bimanual exam: Cervix 0/long/high, firm, anterior, neg CMT, uterus nontender ? ? ?Skin: ?   General: Skin is warm and dry.  ?Neurological:  ?   Mental Status: She is alert and oriented to person, place, and time.  ?Psychiatric:     ?   Mood and Affect: Mood normal.     ?   Behavior: Behavior normal.     ?   Thought Content: Thought content normal.     ?   Judgment: Judgment normal.  ? ?Fetal Tracing: ? ?Baseline: 125 ?Variability: moderate ?Accels: 15x15 ?Decels: none ? ?Toco: UI ? ? ?MAU Course  ?Procedures ?Results for orders placed or performed during the hospital encounter of 11/27/21 (from the past 24 hour(s))  ?Urinalysis, Routine w reflex microscopic     Status:  Abnormal  ? Collection Time: 11/27/21 10:11 PM  ?Result Value Ref Range  ? Color, Urine YELLOW YELLOW  ? APPearance HAZY (A) CLEAR  ? Specific Gravity, Urine 1.033 (H) 1.005 - 1.030  ? pH 5.0 5.0 - 8.0  ? Glucose, UA NEGATIVE NEGATIVE mg/dL  ? Hgb urine dipstick NEGATIVE NEGATIVE  ? Bilirubin Urine NEGATIVE NEGATIVE  ? Ketones, ur 20 (A) NEGATIVE mg/dL  ? Protein, ur 30 (A) NEGATIVE mg/dL  ? Nitrite NEGATIVE NEGATIVE  ? Leukocytes,Ua MODERATE (A) NEGATIVE  ? RBC / HPF 0-5 0 - 5 RBC/hpf  ? WBC, UA 11-20 0 - 5 WBC/hpf  ? Bacteria, UA RARE (A) NONE SEEN  ? Squamous Epithelial / LPF 11-20 0 - 5  ? Mucus PRESENT   ?Wet prep, genital     Status: Abnormal  ? Collection Time: 11/27/21 10:15 PM  ?Result Value Ref Range  ? Yeast Wet Prep HPF POC NONE SEEN NONE SEEN  ? Trich, Wet Prep NONE SEEN NONE SEEN  ? Clue Cells Wet Prep HPF POC NONE SEEN NONE SEEN  ? WBC, Wet Prep HPF POC >=10 (A) <10  ? Sperm NONE SEEN   ?  ?MDM ?UA, UC ?Wet prep and gc/chlamydia ?Flexeril PO- patient reports improvement of pain. ?Fern negative ? ?Lengthy discussion with patient about importance of PO hydration and support measures for round ligament pain. ? ?Assessment and Plan  ? ?1. Encounter for suspected premature rupture of amniotic membranes, with rupture of membranes not found   ?2. [redacted] weeks gestation of pregnancy   ?3. Pain of round ligament during pregnancy   ? ?-Discharge home in stable condition ?-RX for flexeril sent to patient's pharmacy ?-Preterm labor precautions discussed ?-Patient advised to follow-up with OB as scheduled for prenatal care ?-Patient may return to MAU as needed or if her condition were to change or worsen ? ? ?Wende Mott CNM ?11/27/2021, 9:59 PM  ?

## 2021-11-27 NOTE — Discharge Instructions (Signed)

## 2021-11-28 LAB — GC/CHLAMYDIA PROBE AMP (~~LOC~~) NOT AT ARMC
Chlamydia: NEGATIVE
Comment: NEGATIVE
Comment: NORMAL
Neisseria Gonorrhea: NEGATIVE

## 2021-11-29 LAB — CULTURE, OB URINE: Culture: >=100000 — AB

## 2021-11-30 ENCOUNTER — Encounter: Payer: Self-pay | Admitting: Obstetrics and Gynecology

## 2021-11-30 ENCOUNTER — Other Ambulatory Visit (INDEPENDENT_AMBULATORY_CARE_PROVIDER_SITE_OTHER): Payer: 59 | Admitting: Obstetrics and Gynecology

## 2021-11-30 DIAGNOSIS — O2343 Unspecified infection of urinary tract in pregnancy, third trimester: Secondary | ICD-10-CM

## 2021-11-30 MED ORDER — CEFADROXIL 500 MG PO CAPS: 500.0000 mg | ORAL_CAPSULE | Freq: Two times a day (BID) | ORAL | 0 refills | Status: AC

## 2021-11-30 NOTE — Progress Notes (Signed)
TC to notify patient of (+) UCx. No answer. Unable to leave VM, mailbox is full. Rx sent. [facility] message sent. ? ?Laury Deep, CNM  ?11/30/2021 3:38 PM  ?

## 2021-12-03 ENCOUNTER — Other Ambulatory Visit: Payer: Self-pay

## 2021-12-03 ENCOUNTER — Ambulatory Visit (INDEPENDENT_AMBULATORY_CARE_PROVIDER_SITE_OTHER): Payer: 59 | Admitting: Family Medicine

## 2021-12-03 ENCOUNTER — Other Ambulatory Visit (HOSPITAL_COMMUNITY)
Admission: RE | Admit: 2021-12-03 | Discharge: 2021-12-03 | Disposition: A | Discharge status: Home / Self Care | Payer: 59 | Source: Ambulatory Visit | Attending: Family Medicine | Admitting: Family Medicine

## 2021-12-03 ENCOUNTER — Encounter: Payer: Self-pay | Admitting: Family Medicine

## 2021-12-03 VITALS — BP 122/77 | HR 121 | Wt 186.0 lb

## 2021-12-03 DIAGNOSIS — O0990 Supervision of high risk pregnancy, unspecified, unspecified trimester: Secondary | ICD-10-CM | POA: Diagnosis present

## 2021-12-03 DIAGNOSIS — D563 Thalassemia minor: Secondary | ICD-10-CM

## 2021-12-03 DIAGNOSIS — Z227 Latent tuberculosis: Secondary | ICD-10-CM

## 2021-12-03 NOTE — Progress Notes (Signed)
Patient complains of vaginal pain/pressure. She informed me that she is on "day 3" of Rx for UTI ?

## 2021-12-03 NOTE — Progress Notes (Signed)
? ?  PRENATAL VISIT NOTE ? ?Subjective:  ?Alexis Conley is a 21 y.o. G1P0 at 51w4dbeing seen today for ongoing prenatal care.  She is currently monitored for the following issues for this high-risk pregnancy and has Supervision of high risk pregnancy, antepartum; TB lung, latent; and Alpha thalassemia silent carrier on their problem list. ? ?Patient reports no complaints.  Contractions: Not present.  .  Movement: Present. Denies leaking of fluid.  ? ?The following portions of the patient's history were reviewed and updated as appropriate: allergies, current medications, past family history, past medical history, past social history, past surgical history and problem list.  ? ?Objective:  ? ?Vitals:  ? 12/03/21 0904  ?BP: 122/77  ?Pulse: (!) 121  ?Weight: 186 lb (84.4 kg)  ? ? ?Fetal Status: Fetal Heart Rate (bpm): 137   Movement: Present    ? ?General:  Alert, oriented and cooperative. Patient is in no acute distress.  ?Skin: Skin is warm and dry. No rash noted.   ?Cardiovascular: Normal heart rate noted  ?Respiratory: Normal respiratory effort, no problems with respiration noted  ?Abdomen: Soft, gravid, appropriate for gestational age.  Pain/Pressure: Present     ?Pelvic: Cervical exam deferred        ?Extremities: Normal range of motion.     ?Mental Status: Normal mood and affect. Normal behavior. Normal judgment and thought content.  ? ?Assessment and Plan:  ?Pregnancy: G1P0 at 330w4d ?1. Supervision of high risk pregnancy, antepartum ?Reviewed weekly appt until delivery ?Discussed and confirmed plan to breast feed ?Continue to review reproductive life plan ?- Culture, beta strep (group b only) ?- Cervicovaginal ancillary only ? ?2. TB lung, latent ?Treat after delivery ? ?3. Alpha thalassemia silent carrier ? ? ?Preterm labor symptoms and general obstetric precautions including but not limited to vaginal bleeding, contractions, leaking of fluid and fetal movement were reviewed in detail with the  patient. ?Please refer to After Visit Summary for other counseling recommendations.  ? ?No follow-ups on file. ? ?Future Appointments  ?Date Time Provider [place]?12/10/2021  8:35 AM [facility]  ?12/18/2021  3:55 PM [facility] [place]12/24/2021 10:15 AM [facility]?12/31/2021  9:35 AM [facility]adam[facility] ? ?KiCaren MacadamMD ?

## 2021-12-04 LAB — CERVICOVAGINAL ANCILLARY ONLY
Chlamydia: NEGATIVE
Comment: NEGATIVE
Comment: NORMAL
Neisseria Gonorrhea: NEGATIVE

## 2021-12-07 ENCOUNTER — Encounter: Payer: Self-pay | Admitting: *Deleted

## 2021-12-07 LAB — CULTURE, BETA STREP (GROUP B ONLY): Strep Gp B Culture: NEGATIVE

## 2021-12-10 ENCOUNTER — Ambulatory Visit (INDEPENDENT_AMBULATORY_CARE_PROVIDER_SITE_OTHER): Payer: 59 | Admitting: Family Medicine

## 2021-12-10 ENCOUNTER — Other Ambulatory Visit: Payer: Self-pay

## 2021-12-10 VITALS — BP 124/84 | HR 105 | Wt 190.2 lb

## 2021-12-10 DIAGNOSIS — O0990 Supervision of high risk pregnancy, unspecified, unspecified trimester: Secondary | ICD-10-CM

## 2021-12-10 DIAGNOSIS — D563 Thalassemia minor: Secondary | ICD-10-CM

## 2021-12-10 NOTE — Progress Notes (Signed)
? ? ?  PRENATAL VISIT NOTE ? ?Subjective:  ?Alexis Conley is a 21 y.o. G1P0 at 10w4dbeing seen today for ongoing prenatal care.  She is currently monitored for the following issues for this low-risk pregnancy and has Supervision of high risk pregnancy, antepartum; TB lung, latent; and Alpha thalassemia silent carrier on their problem list. ? ?Patient reports no complaints.  Contractions: Not present. Vag. Bleeding: None.  Movement: Present. Denies leaking of fluid.  ? ?The following portions of the patient's history were reviewed and updated as appropriate: allergies, current medications, past family history, past medical history, past social history, past surgical history and problem list.  ? ?Objective:  ? ?Vitals:  ? 12/10/21 0850  ?BP: 124/84  ?Pulse: (!) 105  ?Weight: 190 lb 3.2 oz (86.3 kg)  ? ? ?Fetal Status: Fetal Heart Rate (bpm): 138 Fundal Height: 36 cm Movement: Present  Presentation: Vertex ? ?General:  Alert, oriented and cooperative. Patient is in no acute distress.  ?Skin: Skin is warm and dry. No rash noted.   ?Cardiovascular: Normal heart rate noted  ?Respiratory: Normal respiratory effort, no problems with respiration noted  ?Abdomen: Soft, gravid, appropriate for gestational age.  Pain/Pressure: Present     ?Pelvic: Cervical exam deferred        ?Extremities: Normal range of motion.  Edema: Mild pitting, slight indentation  ?Mental Status: Normal mood and affect. Normal behavior. Normal judgment and thought content.  ? ?Assessment and Plan:  ?Pregnancy: G1P0 at 316w4d1. Supervision of high risk pregnancy, antepartum ?UTD ?Still unsure about reproductive life plan, has bedsider website ?Has pump, handsfree model and plans on proving breastmilk ?Works at [employer] plans to return to work after delivery  ?Last day of work is 3/31 ?Reviewed that swelling is normal and we will monitor BP closely  ? ?Preterm labor symptoms and general obstetric precautions including but not  limited to vaginal bleeding, contractions, leaking of fluid and fetal movement were reviewed in detail with the patient. ?Please refer to After Visit Summary for other counseling recommendations.  ? ?Return in about 1 week (around 12/17/2021) for Mom+Baby Combined Care. ? ?Future Appointments  ?Date Time Provider [place]?12/18/2021  3:55 PM MOMBABYDYAD [facility]?12/24/2021 10:15 AM NeCaren MacadamMD [facility]?12/31/2021  9:35 AM NeCaren MacadamMD [facility]? ? ?KiCaren MacadamMD ?

## 2021-12-18 ENCOUNTER — Other Ambulatory Visit: Payer: Self-pay

## 2021-12-18 ENCOUNTER — Ambulatory Visit (INDEPENDENT_AMBULATORY_CARE_PROVIDER_SITE_OTHER): Payer: 59 | Admitting: Family Medicine

## 2021-12-18 VITALS — BP 126/76 | HR 92 | Wt 188.0 lb

## 2021-12-18 DIAGNOSIS — R030 Elevated blood-pressure reading, without diagnosis of hypertension: Secondary | ICD-10-CM

## 2021-12-18 DIAGNOSIS — O0990 Supervision of high risk pregnancy, unspecified, unspecified trimester: Secondary | ICD-10-CM

## 2021-12-18 NOTE — Patient Instructions (Signed)

## 2021-12-18 NOTE — Progress Notes (Signed)
? ?  Subjective:  ?Alexis Conley is a 21 y.o. G1P0 at 47w5dbeing seen today for ongoing prenatal care.  She is currently monitored for the following issues for this low-risk pregnancy and has Supervision of high risk pregnancy, antepartum; TB lung, latent; and Alpha thalassemia silent carrier on their problem list. ? ?Patient reports no complaints.  Contractions: Irritability. Vag. Bleeding: None.  Movement: Present. Denies leaking of fluid.  ? ?The following portions of the patient's history were reviewed and updated as appropriate: allergies, current medications, past family history, past medical history, past social history, past surgical history and problem list. Problem list updated. ? ?Objective:  ? ?Vitals:  ? 12/18/21 1601 12/18/21 1614  ?BP: (!) 142/79 126/76  ?Pulse: (!) 105 92  ?Weight: 188 lb (85.3 kg)   ? ? ?Fetal Status: Fetal Heart Rate (bpm): 141   Movement: Present    ? ?General:  Alert, oriented and cooperative. Patient is in no acute distress.  ?Skin: Skin is warm and dry. No rash noted.   ?Cardiovascular: Normal heart rate noted  ?Respiratory: Normal respiratory effort, no problems with respiration noted  ?Abdomen: Soft, gravid, appropriate for gestational age. Pain/Pressure: Absent     ?Pelvic: Vag. Bleeding: None     ?Cervical exam deferred        ?Extremities: Normal range of motion.  Edema: None  ?Mental Status: Normal mood and affect. Normal behavior. Normal judgment and thought content.  ? ?Urinalysis:     ? ?Assessment and Plan:  ?Pregnancy: G1P0 at 323w5d ?1. Supervision of high risk pregnancy, antepartum ?FHR normal, BP mildly elevated on initial check, normal on recheck ? ?2. Elevated blood pressure without diagnosis of hypertension ?Initial BP elevated, normal on recheck ?Asymptomatic ?Counseled on warning signs of PreE and need to go to MAU if they occur, will check labs today ? ?Term labor symptoms and general obstetric precautions including but not limited to vaginal bleeding,  contractions, leaking of fluid and fetal movement were reviewed in detail with the patient. ?Please refer to After Visit Summary for other counseling recommendations.  ?Return in 1 week (on 12/25/2021) for Dyad patient, ob visit. ? ? ?EcClarnce FlockMD ? ?

## 2021-12-19 LAB — CMP14+EGFR
ALT: 22 IU/L (ref 0–32)
AST: 22 IU/L (ref 0–40)
Albumin/Globulin Ratio: 1.5 (ref 1.2–2.2)
Albumin: 3.7 g/dL — ABNORMAL LOW (ref 3.9–5.0)
Alkaline Phosphatase: 226 IU/L — ABNORMAL HIGH (ref 44–121)
BUN/Creatinine Ratio: 13 (ref 9–23)
BUN: 8 mg/dL (ref 6–20)
Bilirubin Total: <0.2 mg/dL (ref 0.0–1.2)
CO2: 20 mmol/L (ref 20–29)
Calcium: 9.1 mg/dL (ref 8.7–10.2)
Chloride: 106 mmol/L (ref 96–106)
Creatinine, Ser: 0.64 mg/dL (ref 0.57–1.00)
Globulin, Total: 2.4 g/dL (ref 1.5–4.5)
Glucose: 79 mg/dL (ref 70–99)
Potassium: 4.2 mmol/L (ref 3.5–5.2)
Sodium: 138 mmol/L (ref 134–144)
Total Protein: 6.1 g/dL (ref 6.0–8.5)
eGFR: 127 mL/min/{1.73_m2} (ref >59)

## 2021-12-19 LAB — CBC WITH DIFFERENTIAL/PLATELET
Basophils Absolute: 0 10*3/uL (ref 0.0–0.2)
Basos: 0 %
EOS (ABSOLUTE): 0.1 10*3/uL (ref 0.0–0.4)
Eos: 2 %
Hematocrit: 34 % (ref 34.0–46.6)
Hemoglobin: 11.2 g/dL (ref 11.1–15.9)
Immature Grans (Abs): 0 10*3/uL (ref 0.0–0.1)
Immature Granulocytes: 0 %
Lymphocytes Absolute: 1.4 10*3/uL (ref 0.7–3.1)
Lymphs: 26 %
MCH: 26.2 pg — ABNORMAL LOW (ref 26.6–33.0)
MCHC: 32.9 g/dL (ref 31.5–35.7)
MCV: 80 fL (ref 79–97)
Monocytes Absolute: 0.5 10*3/uL (ref 0.1–0.9)
Monocytes: 10 %
Neutrophils Absolute: 3.2 10*3/uL (ref 1.4–7.0)
Neutrophils: 62 %
Platelets: 154 10*3/uL (ref 150–450)
RBC: 4.27 x10E6/uL (ref 3.77–5.28)
RDW: 14.4 % (ref 11.7–15.4)
WBC: 5.2 10*3/uL (ref 3.4–10.8)

## 2021-12-19 LAB — PROTEIN / CREATININE RATIO, URINE
Creatinine, Urine: 145 mg/dL
Protein, Ur: 31.1 mg/dL
Protein/Creat Ratio: 214 mg/g creat — ABNORMAL HIGH (ref 0–200)

## 2021-12-24 ENCOUNTER — Ambulatory Visit (INDEPENDENT_AMBULATORY_CARE_PROVIDER_SITE_OTHER): Payer: 59 | Admitting: Family Medicine

## 2021-12-24 ENCOUNTER — Encounter: Payer: Self-pay | Admitting: Family Medicine

## 2021-12-24 VITALS — BP 125/79 | HR 96 | Wt 188.9 lb

## 2021-12-24 DIAGNOSIS — O0990 Supervision of high risk pregnancy, unspecified, unspecified trimester: Secondary | ICD-10-CM

## 2021-12-24 NOTE — Progress Notes (Signed)
? ? ?  PRENATAL VISIT NOTE ? ?Subjective:  ?Alexis Conley is a 21 y.o. G1P0 at 61w4dbeing seen today for ongoing prenatal care.  She is currently monitored for the following issues for this low-risk pregnancy and has Supervision of high risk pregnancy, antepartum; TB lung, latent; and Alpha thalassemia silent carrier on their problem list. ? ?Patient reports no complaints.  Contractions: Irritability. Vag. Bleeding: None.  Movement: Present. Denies leaking of fluid.  ? ?The following portions of the patient's history were reviewed and updated as appropriate: allergies, current medications, past family history, past medical history, past social history, past surgical history and problem list.  ? ?Objective:  ? ?Vitals:  ? 12/24/21 1014  ?BP: 125/79  ?Pulse: 96  ?Weight: 188 lb 14.4 oz (85.7 kg)  ? ? ?Fetal Status: Fetal Heart Rate (bpm): 134 Fundal Height: 39 cm Movement: Present  Presentation: Vertex ? ?General:  Alert, oriented and cooperative. Patient is in no acute distress.  ?Skin: Skin is warm and dry. No rash noted.   ?Cardiovascular: Normal heart rate noted  ?Respiratory: Normal respiratory effort, no problems with respiration noted  ?Abdomen: Soft, gravid, appropriate for gestational age.  Pain/Pressure: Absent     ?Pelvic: Cervical exam deferred        ?Extremities: Normal range of motion.  Edema: Trace  ?Mental Status: Normal mood and affect. Normal behavior. Normal judgment and thought content.  ? ?Assessment and Plan:  ?Pregnancy: G1P0 at 325w4d1. Supervision of high risk pregnancy, antepartum ?TWG=28 lb 14.4 oz (13.1 kg)  ?Doing well ?Ready for baby ?Plan to schedule IOL at next visit when 39w4 ?Reviewed plans for coping with labor- Desires unmedicated birth but OK with epidural. Discussed other coping mechanisms ? ?Term labor symptoms and general obstetric precautions including but not limited to vaginal bleeding, contractions, leaking of fluid and fetal movement were reviewed in detail with the  patient. ?Please refer to After Visit Summary for other counseling recommendations.  ? ?Return in about 1 week (around 12/31/2021) for Mom+Baby Combined Care. ? ?Future Appointments  ?Date Time Provider [place]?12/31/2021  9:35 AM NeCaren MacadamMD [facility]? ? ?KiCaren MacadamMD ?

## 2021-12-27 NOTE — Progress Notes (Signed)
? ? ?  PRENATAL VISIT NOTE ? ?Subjective:  ?Alexis Conley is a 21 y.o. G1P0 at 63w0dbeing seen today for ongoing prenatal care.  She is currently monitored for the following issues for this low-risk pregnancy and has Supervision of high risk pregnancy, antepartum; TB lung, latent; and Alpha thalassemia silent carrier on their problem list. ? ?Patient reports occasional contractions.  Contractions: Irregular. Vag. Bleeding: None.  Movement: Present. Denies leaking of fluid.  ? ?The following portions of the patient's history were reviewed and updated as appropriate: allergies, current medications, past family history, past medical history, past social history, past surgical history and problem list.  ? ?Objective:  ? ?Vitals:  ? 12/31/21 0941  ?BP: 122/76  ?Pulse: 91  ?Weight: 190 lb 3.2 oz (86.3 kg)  ? ? ?Fetal Status: Fetal Heart Rate (bpm): 141 Fundal Height: 39 cm Movement: Present  Presentation: Vertex ? ?General:  Alert, oriented and cooperative. Patient is in no acute distress.  ?Skin: Skin is warm and dry. No rash noted.   ?Cardiovascular: Normal heart rate noted  ?Respiratory: Normal respiratory effort, no problems with respiration noted  ?Abdomen: Soft, gravid, appropriate for gestational age.  Pain/Pressure: Present     ?Pelvic: Cervical exam performed in the presence of a chaperone Dilation: 1.5 Effacement (%): 50 Station: -3  ?Extremities: Normal range of motion.  Edema: Trace  ?Mental Status: Normal mood and affect. Normal behavior. Normal judgment and thought content.  ? ?Assessment and Plan:  ?Pregnancy: G1P0 at 321w0d1. Supervision of high risk pregnancy, antepartum ?Client doing well ?Denies any additional concerns at this time ?Client verbalizes understanding and is in agreement with plan of care ? ?Discussed membrane sweeping today. Reviewed cochrane review data on membrane sweeping at 39 wks and then at EDD. Reviewed risk of cramping, contractions, bleeding and ROM. Answered patient questions  and she agreed to proceed with procedure. ? ?IOL requested for 4/20- orders for admission placed. Explained IOL process.  ? ?2. Alpha thalassemia silent carrier ? ? ?Preterm labor symptoms and general obstetric precautions including but not limited to vaginal bleeding, contractions, leaking of fluid and fetal movement were reviewed in detail with the patient. ?Please refer to After Visit Summary for other counseling recommendations.  ? ?Return in about 1 week (around 01/07/2022) for Mom+Baby Combined Care. ? ?Future Appointments  ?Date Time Provider [place]?01/07/2022  9:35 AM NeCaren MacadamMD [facility]? ? ? ?KiCaren MacadamMD ?

## 2021-12-27 NOTE — Patient Instructions (Addendum)
Come to the MAU (maternity admission unit) for ?1) Strong contractions every 2-3 minutes for at least 1 hour that do not go away when you drink water or take a warm shower. These contractions will be so strong all you can do is breath through them ?2) Vaginal bleeding- anything more than spotting ?3) Loss of fluid like you broke your water ?4) Decreased movement of your baby ? ? ?New Induction of Labor Process for [place] and [place] ? ?In Fall 2020 [place] and [place] changed it's process for scheduling inductions of labor to create more induction slots and to make sure patients get COVID-19 testing in advance. After you have been tested you need to quarantine so that you do not get infected after your test. You should not go anywhere after your test except necessary medical appointments. ? ? ?You have been scheduled for induction of labor on 4/20 in the morning. Although you may have a specific time listed on your After Visit Summary or MyChart, we cannot predict when your room will be available. Please disregard this time. A Labor and Delivery staff member will call you on the day that you are scheduled when your room is available. You will need to arrive within one hour of being called. If you do not arrive within this time frame, the next person on the list will be called in and you will move down the list. You may eat a light meal before coming to the hospital. ?If you go into labor, think your water has broken, experience bright red bleeding or don't feel your baby moving as much as usual before your induction, please call your Ob/Gyn's office or come to Entrance C, Maternity Assessment Unit for evaluation. ? ?Thank you, ? ?[facility] ? ?

## 2021-12-31 ENCOUNTER — Encounter: Payer: Self-pay | Admitting: Family Medicine

## 2021-12-31 ENCOUNTER — Ambulatory Visit (INDEPENDENT_AMBULATORY_CARE_PROVIDER_SITE_OTHER): Payer: 59 | Admitting: Family Medicine

## 2021-12-31 VITALS — BP 122/76 | HR 91 | Wt 190.2 lb

## 2021-12-31 DIAGNOSIS — O0990 Supervision of high risk pregnancy, unspecified, unspecified trimester: Secondary | ICD-10-CM

## 2021-12-31 DIAGNOSIS — D563 Thalassemia minor: Secondary | ICD-10-CM

## 2022-01-02 ENCOUNTER — Other Ambulatory Visit: Payer: Self-pay

## 2022-01-02 ENCOUNTER — Encounter (HOSPITAL_COMMUNITY): Payer: Self-pay | Admitting: Family Medicine

## 2022-01-02 ENCOUNTER — Inpatient Hospital Stay (HOSPITAL_COMMUNITY): Payer: 59 | Admitting: Anesthesiology

## 2022-01-02 ENCOUNTER — Inpatient Hospital Stay (HOSPITAL_COMMUNITY)
Admission: AD | Admit: 2022-01-02 | Discharge: 2022-01-05 | DRG: 807 | Disposition: A | Discharge status: Home / Self Care | Payer: 59 | Attending: Family Medicine | Admitting: Family Medicine

## 2022-01-02 DIAGNOSIS — Z3A40 40 weeks gestation of pregnancy: Secondary | ICD-10-CM | POA: Diagnosis not present

## 2022-01-02 DIAGNOSIS — O26893 Other specified pregnancy related conditions, third trimester: Secondary | ICD-10-CM | POA: Diagnosis present

## 2022-01-02 DIAGNOSIS — Z3A39 39 weeks gestation of pregnancy: Secondary | ICD-10-CM | POA: Diagnosis not present

## 2022-01-02 DIAGNOSIS — D563 Thalassemia minor: Secondary | ICD-10-CM | POA: Diagnosis present

## 2022-01-02 DIAGNOSIS — O480 Post-term pregnancy: Secondary | ICD-10-CM | POA: Diagnosis present

## 2022-01-02 DIAGNOSIS — O0990 Supervision of high risk pregnancy, unspecified, unspecified trimester: Principal | ICD-10-CM

## 2022-01-02 DIAGNOSIS — O4292 Full-term premature rupture of membranes, unspecified as to length of time between rupture and onset of labor: Principal | ICD-10-CM | POA: Diagnosis present

## 2022-01-02 DIAGNOSIS — O134 Gestational [pregnancy-induced] hypertension without significant proteinuria, complicating childbirth: Secondary | ICD-10-CM | POA: Diagnosis present

## 2022-01-02 DIAGNOSIS — O4290 Premature rupture of membranes, unspecified as to length of time between rupture and onset of labor, unspecified weeks of gestation: Secondary | ICD-10-CM | POA: Diagnosis present

## 2022-01-02 DIAGNOSIS — Z227 Latent tuberculosis: Secondary | ICD-10-CM | POA: Diagnosis present

## 2022-01-02 DIAGNOSIS — O139 Gestational [pregnancy-induced] hypertension without significant proteinuria, unspecified trimester: Secondary | ICD-10-CM | POA: Clinically undetermined

## 2022-01-02 LAB — CBC
HCT: 33.6 % — ABNORMAL LOW (ref 36.0–46.0)
Hemoglobin: 11.1 g/dL — ABNORMAL LOW (ref 12.0–15.0)
MCH: 26.4 pg (ref 26.0–34.0)
MCHC: 33 g/dL (ref 30.0–36.0)
MCV: 79.8 fL — ABNORMAL LOW (ref 80.0–100.0)
Platelets: 146 10*3/uL — ABNORMAL LOW (ref 150–400)
RBC: 4.21 MIL/uL (ref 3.87–5.11)
RDW: 15 % (ref 11.5–15.5)
WBC: 6.5 10*3/uL (ref 4.0–10.5)
nRBC: 0 % (ref 0.0–0.2)

## 2022-01-02 LAB — TYPE AND SCREEN
ABO/RH(D): O POS
Antibody Screen: NEGATIVE

## 2022-01-02 LAB — POCT FERN TEST: POCT Fern Test: POSITIVE

## 2022-01-02 LAB — RPR: RPR Ser Ql: NONREACTIVE

## 2022-01-02 LAB — RAPID HIV SCREEN (HIV 1/2 AB+AG)
HIV 1/2 Antibodies: NONREACTIVE
HIV-1 P24 Antigen - HIV24: NONREACTIVE

## 2022-01-02 MED ORDER — PHENYLEPHRINE 40 MCG/ML (10ML) SYRINGE FOR IV PUSH (FOR BLOOD PRESSURE SUPPORT): 80.0000 ug | PREFILLED_SYRINGE | INTRAVENOUS | Status: DC | PRN

## 2022-01-02 MED ORDER — FENTANYL CITRATE (PF) 100 MCG/2ML IJ SOLN
100.0000 ug | INTRAMUSCULAR | Status: DC | PRN
  Administered 2022-01-02 (×5): 100 ug via INTRAVENOUS
  Filled 2022-01-02 (×6): qty 2

## 2022-01-02 MED ORDER — OXYCODONE-ACETAMINOPHEN 5-325 MG PO TABS: 2.0000 | ORAL_TABLET | ORAL | Status: DC | PRN

## 2022-01-02 MED ORDER — OXYTOCIN BOLUS FROM INFUSION
333.0000 mL | Freq: Once | INTRAVENOUS | Status: AC
Start: 2022-01-02 — End: 2022-01-03
  Administered 2022-01-03: 333 mL via INTRAVENOUS

## 2022-01-02 MED ORDER — LACTATED RINGERS IV SOLN
INTRAVENOUS | Status: DC
  Administered 2022-01-02 – 2022-01-03 (×3): 1000 mL via INTRAVENOUS

## 2022-01-02 MED ORDER — ACETAMINOPHEN 325 MG PO TABS: 650.0000 mg | ORAL_TABLET | ORAL | Status: DC | PRN

## 2022-01-02 MED ORDER — MISOPROSTOL 25 MCG QUARTER TABLET: 25.0000 ug | ORAL_TABLET | ORAL | Status: DC | PRN

## 2022-01-02 MED ORDER — TERBUTALINE SULFATE 1 MG/ML IJ SOLN: 0.2500 mg | Freq: Once | INTRAMUSCULAR | Status: DC | PRN

## 2022-01-02 MED ORDER — OXYTOCIN-SODIUM CHLORIDE 30-0.9 UT/500ML-% IV SOLN
2.5000 [IU]/h | INTRAVENOUS | Status: DC
  Filled 2022-01-02: qty 500

## 2022-01-02 MED ORDER — LACTATED RINGERS IV SOLN
500.0000 mL | INTRAVENOUS | Status: DC | PRN
  Administered 2022-01-02 (×2): 500 mL via INTRAVENOUS

## 2022-01-02 MED ORDER — SOD CITRATE-CITRIC ACID 500-334 MG/5ML PO SOLN: 30.0000 mL | ORAL | Status: DC | PRN

## 2022-01-02 MED ORDER — EPHEDRINE 5 MG/ML INJ: 10.0000 mg | INTRAVENOUS | Status: DC | PRN

## 2022-01-02 MED ORDER — FENTANYL-BUPIVACAINE-NACL 0.5-0.125-0.9 MG/250ML-% EP SOLN
12.0000 mL/h | EPIDURAL | Status: DC | PRN
  Filled 2022-01-02: qty 250

## 2022-01-02 MED ORDER — LIDOCAINE HCL (PF) 1 % IJ SOLN
30.0000 mL | INTRAMUSCULAR | Status: AC | PRN
  Administered 2022-01-03: 30 mL via SUBCUTANEOUS
  Filled 2022-01-02: qty 30

## 2022-01-02 MED ORDER — LACTATED RINGERS IV SOLN
500.0000 mL | Freq: Once | INTRAVENOUS | Status: AC
  Administered 2022-01-02: 500 mL via INTRAVENOUS

## 2022-01-02 MED ORDER — OXYCODONE-ACETAMINOPHEN 5-325 MG PO TABS: 1.0000 | ORAL_TABLET | ORAL | Status: DC | PRN

## 2022-01-02 MED ORDER — LIDOCAINE HCL (PF) 1 % IJ SOLN
INTRAMUSCULAR | Status: DC | PRN
  Administered 2022-01-02: 5 mL via EPIDURAL

## 2022-01-02 MED ORDER — FENTANYL-BUPIVACAINE-NACL 0.5-0.125-0.9 MG/250ML-% EP SOLN
EPIDURAL | Status: DC | PRN
  Administered 2022-01-02: 12 mL/h via EPIDURAL

## 2022-01-02 MED ORDER — ONDANSETRON HCL 4 MG/2ML IJ SOLN: 4.0000 mg | Freq: Four times a day (QID) | INTRAMUSCULAR | Status: DC | PRN

## 2022-01-02 MED ORDER — DIPHENHYDRAMINE HCL 50 MG/ML IJ SOLN: 12.5000 mg | INTRAMUSCULAR | Status: DC | PRN

## 2022-01-02 MED ORDER — OXYTOCIN-SODIUM CHLORIDE 30-0.9 UT/500ML-% IV SOLN
1.0000 m[IU]/min | INTRAVENOUS | Status: DC
  Administered 2022-01-02: 2 m[IU]/min via INTRAVENOUS
  Filled 2022-01-02: qty 500

## 2022-01-02 NOTE — Anesthesia Preprocedure Evaluation (Addendum)
Anesthesia Evaluation  ?Patient identified by MRN, date of birth, ID band ?Patient awake ? ? ? ?Reviewed: ?Allergy & Precautions, NPO status , Patient's Chart, lab work & pertinent test results ? ?Airway ?Mallampati: II ? ?TM Distance: >3 FB ?Neck ROM: Full ? ? ? Dental ?no notable dental hx. ?(+) Teeth Intact ?  ?Pulmonary ? ??Latent TB on no precautions ?  ?Pulmonary exam normal ?breath sounds clear to auscultation ? ? ? ? ? ? Cardiovascular ?Exercise Tolerance: Good ?Normal cardiovascular exam ?Rhythm:Regular Rate:Normal ? ? ?  ?Neuro/Psych ?negative neurological ROS ?   ? GI/Hepatic ?negative GI ROS, Neg liver ROS,   ?Endo/Other  ?negative endocrine ROS ? Renal/GU ?negative Renal ROS  ? ?  ?Musculoskeletal ? ? Abdominal ?  ?Peds ? Hematology ?Lab Results ?     Component                Value               Date                 ?     WBC                      6.5                 01/02/2022           ?     HGB                      11.1 (L)            01/02/2022           ?     HCT                      33.6 (L)            01/02/2022           ?     MCV                      79.8 (L)            01/02/2022           ?     PLT                      146 (L)             01/02/2022           ?      ?   ?Anesthesia Other Findings ? ? Reproductive/Obstetrics ?(+) Pregnancy ? ?  ? ? ? ? ? ? ? ? ? ? ? ? ? ?  ?  ? ? ? ? ? ? ? ? ?Anesthesia Physical ?Anesthesia Plan ? ?ASA: 2 ? ?Anesthesia Plan: Epidural  ? ?Post-op Pain Management:   ? ?Induction:  ? ?PONV Risk Score and Plan:  ? ?Airway Management Planned:  ? ?Additional Equipment:  ? ?Intra-op Plan:  ? ?Post-operative Plan:  ? ?Informed Consent: I have reviewed the patients History and Physical, chart, labs and discussed the procedure including the risks, benefits and alternatives for the proposed anesthesia with the patient or authorized representative who has indicated his/her understanding and acceptance.  ? ? ? ? ? ?Plan Discussed with:   ? ?Anesthesia Plan Comments: (39.6 Wk primagravida  For LEA)  ? ? ? ? ? ?  Anesthesia Quick Evaluation ? ?

## 2022-01-02 NOTE — Anesthesia Procedure Notes (Signed)
Epidural ?Patient location during procedure: OB ?Start time: 01/02/2022 10:50 PM ?End time: 01/02/2022 11:05 PM ? ?Staffing ?Anesthesiologist: Barnet Glasgow, MD ?Performed: anesthesiologist  ? ?Preanesthetic Checklist ?Completed: patient identified, IV checked, site marked, risks and benefits discussed, surgical consent, monitors and equipment checked, pre-op evaluation and timeout performed ? ?Epidural ?Patient position: sitting ?Prep: DuraPrep and site prepped and draped ?Patient monitoring: continuous pulse ox and blood pressure ?Approach: midline ?Location: L3-L4 ?Injection technique: LOR air ? ?Needle:  ?Needle type: Tuohy  ?Needle gauge: 17 G ?Needle length: 9 cm and 9 ?Needle insertion depth: 7 cm ?Catheter type: closed end flexible ?Catheter size: 19 Gauge ?Catheter at skin depth: 12 cm ?Test dose: negative ? ?Assessment ?Events: blood not aspirated, injection not painful, no injection resistance, no paresthesia and negative IV test ? ?Additional Notes ?Patient identified. Risks/Benefits/Options discussed with patient including but not limited to bleeding, infection, nerve damage, paralysis, failed block, incomplete pain control, headache, blood pressure changes, nausea, vomiting, reactions to medication both or allergic, itching and postpartum back pain. Confirmed with bedside nurse the patient's most recent platelet count. Confirmed with patient that they are not currently taking any anticoagulation, have any bleeding history or any family history of bleeding disorders. Patient expressed understanding and wished to proceed. All questions were answered. Sterile technique was used throughout the entire procedure. Please see nursing notes for vital signs. Test dose was given through epidural needle and negative prior to continuing to dose epidural or start infusion. Warning signs of high block given to the patient including shortness of breath, tingling/numbness in hands, complete motor block, or any  concerning symptoms with instructions to call for help. Patient was given instructions on fall risk and not to get out of bed. All questions and concerns addressed with instructions to call with any issues.  1 Attempt (S) . Patient tolerated procedure well. ? ? ? ?

## 2022-01-02 NOTE — MAU Note (Signed)
.  Alexis Conley is a 21 y.o. at 50w6dhere in MAU reporting: contractions since 0400 and leaking clear fld since 0430. Reports being 1.5cm on Monday and good FM. No VB.  ?[facility] ?Onset of complaint: 0400 ?Pain score: 4 ?Vitals:  ? 01/02/22 0539  ?Pulse: 87  ?Resp: 18  ?Temp: 98 ?F (36.7 ?C)  ?SpO2: 100%  ?   ?FHT:145 ?Lab orders placed from triage:  none ? ?

## 2022-01-02 NOTE — Progress Notes (Signed)
Labor Progress Note ?Alexis Conley is a 21 y.o. G1P0 at 24w6dwho presented for SROM.  ? ?S: Doing well. No concerns.  ? ?O:  ?BP 118/80   Pulse 97   Temp 98.2 ?F (36.8 ?C) (Oral)   Resp 18   Ht '5\' 2"'$  (1.575 m)   Wt 86.6 kg   LMP 03/29/2021 (Exact Date)   SpO2 100%   BMI 34.93 kg/m?  ? ?EFM: Baseline 135 bpm, moderate variability, + accels, no decels  ? ?CVE: Dilation: 4.5 ?Effacement (%): 60 ?Station: -3 ?Presentation: Vertex ?Exam by:: YAugust AlbinoRNC ? ?A&P: 21y.o. G1P0 357w6d? ?#Labor: SVE largely unchanged from prior exam after AROM. Will start Pitocin 2x2 and reassess in 4 hours.  ?#Pain: PRN ?#FWB: Cat 1  ?#GBS negative ? ?ChGenia DelMD ?11:09 AM ? ?

## 2022-01-02 NOTE — Progress Notes (Signed)
Labor Progress Note ?Alexis Conley is a 21 y.o. G1P0 at 87w6dwho presented for SROM.  ? ?S: Feeling more painful contractions. Requesting IV pain medicine. No other concerns at this time.  ? ?O:  ?BP 136/60 (BP Location: Right Arm)   Pulse 77   Temp 98.1 ?F (36.7 ?C) (Oral)   Resp 16   Ht '5\' 2"'$  (1.575 m)   Wt 86.6 kg   LMP 03/29/2021 (Exact Date)   SpO2 97%   BMI 34.93 kg/m?  ? ?EFM: Baseline 155 bpm, moderate variability, no accels, no decels  ?Toco: Every 2-3 minutes  ? ?CVE: Dilation: 4 ?Effacement (%): 70 ?Station: -3 ?Presentation: Vertex ?Exam by:: Dr. AGwenlyn Perking? ?A&P: 21y.o. G1P0 314w6d? ?#Labor: SVE similar to prior. IUPC placed this check without difficulty. Will continue Pitocin and titrate to adequate contraction pattern. Plan to reassess 4 hours after adequate contractions, sooner as needed.  ?#Pain: PRN; IV Fentanyl for now  ?#FWB: Cat 1  ?#GBS negative ? ?ChGenia DelMD ?4:45 PM ? ?

## 2022-01-02 NOTE — Progress Notes (Signed)
Patient ID: Alexis Conley, female   DOB: [birth year], 21 y.o.   MRN: [id number] ?Having more trouble tolerating pain of contractions ?Decided she wants an epidural  ? ?Vitals:  ? 01/02/22 1831 01/02/22 1850 01/02/22 1900 01/02/22 2116  ?BP: (!) 141/77  130/74   ?Pulse: (!) 130  86   ?Resp: 20  18   ?Temp:    98.3 ?F (36.8 ?C)  ?TempSrc:    Oral  ?SpO2:  99% 94%   ?Weight:      ?Height:      ? ?FHR reassuring ?UCs every 1-42mn ? ?.Dilation: 5 ?Effacement (%): 80 ?Cervical Position: Middle ?Station: -3 ?Presentation: Vertex ?Exam by:: WJimmye NormanCNM ? ?Discussed that she is not 7cm but rather 5cm,  reassured we will continue to monitor progress ? ? ?

## 2022-01-02 NOTE — H&P (Signed)
?OBSTETRIC ADMISSION HISTORY AND PHYSICAL ? ?Alexis Conley is a 21 y.o. female G1P0 with IUP at 46w6dby LMP presenting for SROM at 0430. She reports +FMs, No LOF, no VB, no blurry vision, headaches or peripheral edema, and RUQ pain.  She plans on breast feeding. She is unsure about birth control. ?[facility]  ? ?Dating: By LMP --->  Estimated Date of Delivery: 01/03/22 ? ?Sono:   ?'@[redacted]w[redacted]d'$ , CWD, normal anatomy,cephalic  presentation, posterior placental lie, 251g, 27% EFW ? ? ?Prenatal History/Complications:  ?Alpha thalassemia carrier ?Latent TB- positive skin PPD, received INH and Rifampin but paused due to nausea. Plan for PP resuming INH ? ?Past Medical History: ?Past Medical History:  ?Diagnosis Date  ? Eczema   ? Heart murmur   ? When younger  ? TB lung, latent   ? ? ?Past Surgical History: ?Past Surgical History:  ?Procedure Laterality Date  ? NO PAST SURGERIES    ? ? ?Obstetrical History: ?OB History   ? ? Gravida  ?1  ? Para  ?   ? Term  ?   ? Preterm  ?   ? AB  ?   ? Living  ?   ?  ? ? SAB  ?   ? IAB  ?   ? Ectopic  ?   ? Multiple  ?   ? Live Births  ?   ?   ?  ?  ? ? ?Social History ?Social History  ? ?Socioeconomic History  ? Marital status: Single  ?  Spouse name: Not on file  ? Number of children: Not on file  ? Years of education: Not on file  ? Highest education level: Not on file  ?Occupational History  ? Not on file  ?Tobacco Use  ? Smoking status: Never  ? Smokeless tobacco: Never  ?Vaping Use  ? Vaping Use: Never used  ?Substance and Sexual Activity  ? Alcohol use: No  ? Drug use: No  ? Sexual activity: Yes  ?  Birth control/protection: None  ?Other Topics Concern  ? Not on file  ?Social History Narrative  ? Not on file  ? ?Social Determinants of Health  ? ?Financial Resource Strain: Not on file  ?Food Insecurity: No Food Insecurity  ? Worried About RCharity fundraiserin the Last Year: Never true  ? Ran Out of Food in the Last Year: Never true  ?Transportation  Needs: No Transportation Needs  ? Lack of Transportation (Medical): No  ? Lack of Transportation (Non-Medical): No  ?Physical Activity: Not on file  ?Stress: Not on file  ?Social Connections: Not on file  ? ? ?Family History: ?Family History  ?Problem Relation Age of Onset  ? Healthy Mother   ? Healthy Father   ? ? ?Allergies: ?Allergies  ?Allergen Reactions  ? Peanut-Containing Drug Products Anaphylaxis  ? Shellfish Allergy Anaphylaxis  ? Tomato Hives  ? Mushroom Extract Complex   ?  Childhood allergy.... Does not know reaction.   ? ? ?Medications Prior to Admission  ?Medication Sig Dispense Refill Last Dose  ? Prenatal Vit-Fe Fumarate-FA (PREPLUS) 27-1 MG TABS Take 1 tablet by mouth daily. 30 tablet 6 Past Week  ? cyclobenzaprine (FLEXERIL) 10 MG tablet Take 1 tablet (10 mg total) by mouth 2 (two) times daily as needed for muscle spasms. 20 tablet 0   ? ? ? ?Review of Systems  ? ?All systems reviewed and negative except as stated  in HPI ? ?Pulse 87, temperature 98 ?F (36.7 ?C), resp. rate 18, height '5\' 2"'$  (1.575 m), weight 86.6 kg, last menstrual period 03/29/2021, SpO2 100 %. ?General appearance: alert ?Lungs: clear to auscultation bilaterally ?Heart: regular rate and rhythm ?Abdomen: soft, non-tender; bowel sounds normal ?Extremities: Homans sign is negative, no sign of DVT ?Presentation: cephalic ?Fetal monitoringBaseline: 140 bpm, Variability: Good {> 6 bpm), Accelerations: Reactive, and Decelerations: Absent ?Uterine activity 2-5 min ?Dilation: 4 ?Effacement (%): 80 ?Station: -3 ?Exam by:: Rebeca Alert, RN ? ? ?Prenatal labs: ?ABO, Rh: O/Positive/-- (10/12 1424) ?Antibody: Negative (10/12 1424) ?Rubella: 4.23 (10/12 1424) ?RPR: Non Reactive (01/16 0843)  ?HBsAg: Negative (10/12 1424)  ?HIV: Non Reactive (01/16 0843)  ?GBS: Negative/-- (03/13 1000)  ?2 hr Glucola normal ?Genetic screening  LR NIPS, alpha thalassemia silent carrier ?Anatomy US normal ? ?Prenatal Transfer Tool  ?Maternal Diabetes: No ?Genetic  Screening: Normal ?Maternal Ultrasounds/Referrals: Normal ?Fetal Ultrasounds or other Referrals:  None ?Maternal Substance Abuse:  No ?Significant Maternal Medications:  None ?Significant Maternal Lab Results: Group B Strep negative ? ?Results for orders placed or performed during the hospital encounter of 01/02/22 (from the past 24 hour(s))  ?POCT fern test  ? Collection Time: 01/02/22  6:12 AM  ?Result Value Ref Range  ? POCT Fern Test Positive = ruptured amniotic membanes   ? ? ?Patient Active Problem List  ? Diagnosis Date Noted  ? Alpha thalassemia silent carrier 09/04/2021  ? 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 Supervision of high risk pregnancy, antepartum 06/13/2021  ? ? ?Assessment/Plan:  ?Alexis Conley is a 21 y.o. G1P0 at 62w6dhere for SROM at 0430AM ? ?#Labor: presented with SROM. Cervix 4cm. Plan for expectant management and recheck in 4-6 hours and augment if warranted ?#Pain: Plans nitrous and IV pain meds ?#FWB: Cat I ?#ID:  GBS neg ?#MOF: breast ?#MOC: unsure ?#Circ:  N/A ? ?ARenard Matter MD  ?01/02/2022, 6:28 AM ? ? ? ?

## 2022-01-03 ENCOUNTER — Encounter (HOSPITAL_COMMUNITY): Payer: Self-pay | Admitting: Family Medicine

## 2022-01-03 DIAGNOSIS — Z3A40 40 weeks gestation of pregnancy: Secondary | ICD-10-CM

## 2022-01-03 DIAGNOSIS — O480 Post-term pregnancy: Secondary | ICD-10-CM

## 2022-01-03 DIAGNOSIS — O139 Gestational [pregnancy-induced] hypertension without significant proteinuria, unspecified trimester: Secondary | ICD-10-CM | POA: Clinically undetermined

## 2022-01-03 LAB — CBC
HCT: 31 % — ABNORMAL LOW (ref 36.0–46.0)
Hemoglobin: 10.2 g/dL — ABNORMAL LOW (ref 12.0–15.0)
MCH: 26.3 pg (ref 26.0–34.0)
MCHC: 32.9 g/dL (ref 30.0–36.0)
MCV: 79.9 fL — ABNORMAL LOW (ref 80.0–100.0)
Platelets: 136 10*3/uL — ABNORMAL LOW (ref 150–400)
RBC: 3.88 MIL/uL (ref 3.87–5.11)
RDW: 15 % (ref 11.5–15.5)
WBC: 13.3 10*3/uL — ABNORMAL HIGH (ref 4.0–10.5)
nRBC: 0 % (ref 0.0–0.2)

## 2022-01-03 MED ORDER — COCONUT OIL OIL: 1.0000 "application " | TOPICAL_OIL | Status: DC | PRN

## 2022-01-03 MED ORDER — METHYLERGONOVINE MALEATE 0.2 MG/ML IJ SOLN: 0.2000 mg | INTRAMUSCULAR | Status: DC | PRN

## 2022-01-03 MED ORDER — ONDANSETRON HCL 4 MG/2ML IJ SOLN
4.0000 mg | INTRAMUSCULAR | Status: DC | PRN
Start: 2022-01-03 — End: 2022-01-05

## 2022-01-03 MED ORDER — DIPHENHYDRAMINE HCL 25 MG PO CAPS: 25.0000 mg | ORAL_CAPSULE | Freq: Four times a day (QID) | ORAL | Status: DC | PRN

## 2022-01-03 MED ORDER — PRENATAL MULTIVITAMIN CH
1.0000 | ORAL_TABLET | Freq: Every day | ORAL | Status: DC
Start: 2022-01-03 — End: 2022-01-05
  Administered 2022-01-03 – 2022-01-05 (×3): 1 via ORAL
  Filled 2022-01-03 (×3): qty 1

## 2022-01-03 MED ORDER — DOCUSATE SODIUM 100 MG PO CAPS
100.0000 mg | ORAL_CAPSULE | Freq: Two times a day (BID) | ORAL | Status: DC
  Administered 2022-01-03 – 2022-01-05 (×4): 100 mg via ORAL
  Filled 2022-01-03 (×5): qty 1

## 2022-01-03 MED ORDER — BENZOCAINE-MENTHOL 20-0.5 % EX AERO
1.0000 "application " | INHALATION_SPRAY | CUTANEOUS | Status: DC | PRN
  Filled 2022-01-03: qty 56

## 2022-01-03 MED ORDER — ONDANSETRON HCL 4 MG PO TABS
4.0000 mg | ORAL_TABLET | ORAL | Status: DC | PRN
Start: 2022-01-03 — End: 2022-01-05

## 2022-01-03 MED ORDER — LIDOCAINE-EPINEPHRINE (PF) 2 %-1:200000 IJ SOLN
INTRAMUSCULAR | Status: DC | PRN
  Administered 2022-01-03: 5 mL via EPIDURAL

## 2022-01-03 MED ORDER — FENTANYL CITRATE (PF) 100 MCG/2ML IJ SOLN
100.0000 ug | Freq: Once | INTRAMUSCULAR | Status: AC
  Administered 2022-01-03: 100 ug via EPIDURAL

## 2022-01-03 MED ORDER — MEDROXYPROGESTERONE ACETATE 150 MG/ML IM SUSP: 150.0000 mg | INTRAMUSCULAR | Status: DC | PRN

## 2022-01-03 MED ORDER — FLEET ENEMA 7-19 GM/118ML RE ENEM: 1.0000 | ENEMA | Freq: Every day | RECTAL | Status: DC | PRN

## 2022-01-03 MED ORDER — SIMETHICONE 80 MG PO CHEW: 80.0000 mg | CHEWABLE_TABLET | ORAL | Status: DC | PRN

## 2022-01-03 MED ORDER — DIBUCAINE (PERIANAL) 1 % EX OINT: 1.0000 "application " | TOPICAL_OINTMENT | CUTANEOUS | Status: DC | PRN

## 2022-01-03 MED ORDER — FERROUS SULFATE 325 (65 FE) MG PO TABS
325.0000 mg | ORAL_TABLET | ORAL | Status: DC
Start: 2022-01-03 — End: 2022-01-05
  Administered 2022-01-03 – 2022-01-05 (×2): 325 mg via ORAL
  Filled 2022-01-03 (×2): qty 1

## 2022-01-03 MED ORDER — WITCH HAZEL-GLYCERIN EX PADS: 1.0000 "application " | MEDICATED_PAD | CUTANEOUS | Status: DC | PRN

## 2022-01-03 MED ORDER — TETANUS-DIPHTH-ACELL PERTUSSIS 5-2.5-18.5 LF-MCG/0.5 IM SUSY: 0.5000 mL | PREFILLED_SYRINGE | Freq: Once | INTRAMUSCULAR | Status: DC

## 2022-01-03 MED ORDER — IBUPROFEN 600 MG PO TABS
600.0000 mg | ORAL_TABLET | Freq: Four times a day (QID) | ORAL | Status: DC
  Administered 2022-01-03 – 2022-01-05 (×8): 600 mg via ORAL
  Filled 2022-01-03 (×8): qty 1

## 2022-01-03 MED ORDER — BISACODYL 10 MG RE SUPP: 10.0000 mg | Freq: Every day | RECTAL | Status: DC | PRN

## 2022-01-03 MED ORDER — MEASLES, MUMPS & RUBELLA VAC IJ SOLR: 0.5000 mL | Freq: Once | INTRAMUSCULAR | Status: DC

## 2022-01-03 MED ORDER — METHYLERGONOVINE MALEATE 0.2 MG PO TABS: 0.2000 mg | ORAL_TABLET | ORAL | Status: DC | PRN

## 2022-01-03 MED ORDER — ACETAMINOPHEN 325 MG PO TABS: 650.0000 mg | ORAL_TABLET | ORAL | Status: DC | PRN

## 2022-01-03 NOTE — Progress Notes (Signed)
Patient ID: Alexis Conley, female   DOB: [birth year], 21 y.o.   MRN: [id number] ?Feeling a lot of pressure ? ?Vitals:  ? 01/03/22 0301 01/03/22 0331 01/03/22 0401 01/03/22 0431  ?BP: 128/90 130/84 129/67 139/81  ?Pulse: (!) 106 (!) 125 (!) 125 (!) 122  ?Resp:      ?Temp:  98.3 ?F (36.8 ?C)    ?TempSrc:  Oral    ?SpO2:      ?Weight:      ?Height:      ? ?FHR stable and reassuring ?UCs regular ? ?Dilation: Lip/rim ?Effacement (%): 100 ?Cervical Position: Anterior ?Station: 0 ?Presentation: Vertex ?Exam by:: Jimmye Norman CNM ? ?Will anticipate second stage soon ?

## 2022-01-03 NOTE — Lactation Note (Signed)
This note was copied from a baby's chart. ?Lactation Consultation Note ? ?Patient Name: Alexis Conley ?PJSRP'R Date: 01/03/2022 ?Reason for consult: Initial assessment;1st time breastfeeding;Primapara;Term ?Age:23 hours ? ? ?P1 mother whose infant is now 21 hours old.  This is a term baby at 40+0 weeks.  Mother's current feeding preference is breast. ? ?Baby "Na'Ari" was swaddled and asleep in mother's arms when I arrived.  Mother reported a good 5 minutes breast feed an hour ago.  She is familiar with hand expression and has observed colostrum.  Encouraged her to do lots of STS and hand expression; demonstrated finger feeding/spoon feeding.  Suggested mother call her RN/LC for latch assistance as needed. ? ? ?Maternal Data ?Has patient been taught Hand Expression?: Yes ?Does the patient have breastfeeding experience prior to this delivery?: No ? ?Feeding ?Mother's Current Feeding Choice: Breast Milk ? ?LATCH Score ?Latch: Grasps breast easily, tongue down, lips flanged, rhythmical sucking. ? ?Audible Swallowing: A few with stimulation ? ?Type of Nipple: Everted at rest and after stimulation ? ?Comfort (Breast/Nipple): Soft / non-tender ? ?Hold (Positioning): Full assist, staff holds infant at breast ? ?LATCH Score: 7 ? ? ?Lactation Tools Discussed/Used ?  ? ?Interventions ?Interventions: Breast feeding basics reviewed;Education ? ?Discharge ?Pump: Personal ? ?Consult Status ?Consult Status: Follow-up ?Date: 01/04/22 ?Follow-up type: In-patient ? ? ? ?Shaddai Shapley R Malik Ruffino ?01/03/2022, 2:05 PM ? ? ? ?

## 2022-01-03 NOTE — Lactation Note (Signed)
This note was copied from a baby's chart. ?Lactation Consultation Note ? ?Patient Name: Alexis Conley ?SWNIO'E Date: 01/03/2022 ?Reason for consult: L&D Initial assessment;Primapara;1st time breastfeeding;Term ?Age:23 hours ? ? ?Initial L&D Consult: ? ?Visited with family < 1 hour after birth ?Assisted to latch easily and observed baby feeding with stimulation for 5 minutes.  Reassured mother that lactation services will be provided on the M/B unit.  2 visitors present. ? ? ?Maternal Data ?  ? ?Feeding ?Mother's Current Feeding Choice: Breast Milk ? ?LATCH Score ?Latch: Grasps breast easily, tongue down, lips flanged, rhythmical sucking. ? ?Audible Swallowing: A few with stimulation ? ?Type of Nipple: Everted at rest and after stimulation ? ?Comfort (Breast/Nipple): Soft / non-tender ? ?Hold (Positioning): Full assist, staff holds infant at breast ? ?LATCH Score: 7 ? ? ?Lactation Tools Discussed/Used ?  ? ?Interventions ?Interventions: Breast feeding basics reviewed;Assisted with latch;Skin to skin;Hand express;Support pillows;Adjust position;DEBP ? ?Discharge ?  ? ?Consult Status ?Consult Status: Follow-up from L&D ? ? ? ?Kieffer Blatz R Sarika Baldini ?01/03/2022, 1:53 PM ? ? ? ?

## 2022-01-03 NOTE — Progress Notes (Signed)
Labor Progress Note ?Alexis Conley is a 21 y.o. G1P0 at 36w0dpresented for SROM ?S: Patient is resting comfortably. ? ?O:  ?BP 128/90   Pulse (!) 106   Temp 98 ?F (36.7 ?C) (Oral)   Resp 18   Ht '5\' 2"'$  (1.575 m)   Wt 86.6 kg   LMP 03/29/2021 (Exact Date)   SpO2 99%   BMI 34.93 kg/m?  ?EFM: 140 baseline /moderate variability/+ acceleration ? ?CVE: Dilation: 6.5 ?Effacement (%): 90 ?Cervical Position: Middle ?Station: -2 ?Presentation: Vertex ?Exam by:: [name] ? ? ?A&P: 21y.o. G1P0 468w0d?#Labor: Progressing well. Continue plan of care. Will reassess in 3-4 hours ?#Pain: Epidural ?#FWB: Cat 1 ?#GBS negative ? ? ?JoAlen BleacherMD ?[facility]:39 AM  ?

## 2022-01-03 NOTE — Discharge Summary (Signed)
? ?  Postpartum Discharge Summary ? ?Date of Service updated: 01/04/2022 ? ?   ?Patient Name: Alexis Conley ?DOB: 2000/12/07 ?MRN: [id number] ? ?Date of admission: 01/02/2022 ?Delivery date:01/03/2022  ?Delivering provider: Christin Fudge  ?Date of discharge: 01/05/2022 ? ?Admitting diagnosis: Post term pregnancy [O48.0] ?Intrauterine pregnancy: [redacted]w[redacted]d    ?Secondary diagnosis:  Principal Problem: ?  PROM (premature rupture of membranes) ?Active Problems: ?  Supervision of high risk pregnancy, antepartum ?  TB lung, latent ?  Alpha thalassemia silent carrier ?  Gestational hypertension ? ?Additional problems: none    ?Discharge diagnosis: Term Pregnancy Delivered                                              ?Post partum procedures: None ?Augmentation: Pitocin ?Complications: None ? ?Hospital course: Onset of Labor With Vaginal Delivery      ?21y.o. yo G1P0 at 470w0das admitted in Latent Labor on 01/02/2022. Patient had an uncomplicated labor course as follows:  ?Membrane Rupture Time/Date: 4:30 AM ,01/02/2022   ?Delivery Method:Vaginal, Spontaneous  ?Episiotomy: None  ?Lacerations:  2nd degree  ?Patient had an uncomplicated postpartum course.  Pt did not require blood pressure management postpartum. She was discharged on Lasix for 5 days.  She is ambulating, tolerating a regular diet, passing flatus, and urinating well. Patient is discharged home in stable condition on 01/05/22. ? ?Newborn Data: ?Birth date:01/03/2022  ?Birth time:10:40 AM  ?Gender:Female  ?Living status:Living  ?Apgars:8 ,9  ?WeWOEHOZ:[id number]  ? ?Magnesium Sulfate received: No ?BMZ received: No ?Rhophylac:N/A ?MMR:N/A ?T-DaP: N/A ?Flu: Yes ?Transfusion:No ? ?Physical exam  ?Vitals:  ? 01/03/22 2235 01/04/22 0522 01/04/22 2138 01/05/22 0538  ?BP: 111/74 111/72 131/68 (!) 129/59  ?Pulse: 88 91 94 78  ?Resp: _0 ?Temp: 98.2 ?F (36.8 ?C) 98 ?F (36.7 ?C) 98.4 ?F (36.9 ?C) 98.1 ?F (36.7 ?C)  ?TempSrc: Oral Oral Oral Oral  ?SpO2: 100% 100%  100% 100%  ?Weight:      ?Height:      ? ?General: alert, cooperative, and no distress ?Lochia: appropriate ?Uterine Fundus: firm ?Incision: N/A ?DVT Evaluation: No evidence of DVT seen on physical exam.  2+ edema ?Labs: ?Lab Results  ?Component Value Date  ? WBC 13.7 (H) 01/04/2022  ? HGB 9.0 (L) 01/04/2022  ? HCT 27.3 (L) 01/04/2022  ? MCV 80.3 01/04/2022  ? PLT 127 (L) 01/04/2022  ? ? ?  Latest Ref Rng & Units 12/18/2021  ?  5:07 PM  ?CMP  ?Glucose 70 - 99 mg/dL 79    ?BUN 6 - 20 mg/dL 8    ?Creatinine 0.57 - 1.00 mg/dL 0.64    ?Sodium 134 - 144 mmol/L 138    ?Potassium 3.5 - 5.2 mmol/L 4.2    ?Chloride 96 - 106 mmol/L 106    ?CO2 20 - 29 mmol/L 20    ?Calcium 8.7 - 10.2 mg/dL 9.1    ?Total Protein 6.0 - 8.5 g/dL 6.1    ?Total Bilirubin 0.0 - 1.2 mg/dL <0.2    ?Alkaline Phos 44 - 121 IU/L 226    ?AST 0 - 40 IU/L 22    ?ALT 0 - 32 IU/L 22    ? ?Edinburgh Score: ? ?  01/03/2022  ?  6:19 PM  ?Edinburgh Postnatal Depression Scale Screening Tool  ?I have been able to  laugh and see the funny side of things. 0  ?I have looked forward with enjoyment to things. 0  ?I have blamed myself unnecessarily when things went wrong. 0  ?I have been anxious or worried for no good reason. 0  ?I have felt scared or panicky for no good reason. 0  ?Things have been getting on top of me. 0  ?I have been so unhappy that I have had difficulty sleeping. 0  ?I have felt sad or miserable. 0  ?I have been so unhappy that I have been crying. 0  ?The thought of harming myself has occurred to me. 0  ?Edinburgh Postnatal Depression Scale Total 0  ? ? ? ?After visit meds:  ?Allergies as of 01/05/2022   ? ?   Reactions  ? Peanut-containing Drug Products Anaphylaxis  ? Shellfish Allergy Anaphylaxis  ? Tomato Hives  ? Mushroom Extract Complex   ? Childhood allergy.... Does not know reaction.   ? ?  ? ?  ?Medication List  ?  ? ?TAKE these medications   ? ?acetaminophen 325 MG tablet ?Commonly known as: Tylenol ?Take 2 tablets (650 mg total) by mouth every  4 (four) hours as needed (for pain scale < 4). ?  ?cyclobenzaprine 10 MG tablet ?Commonly known as: FLEXERIL ?Take 1 tablet (10 mg total) by mouth 2 (two) times daily as needed for muscle spasms. ?  ?furosemide 20 MG tablet ?Commonly known as: LASIX ?Take 1 tablet (20 mg total) by mouth daily. ?  ?ibuprofen 600 MG tablet ?Commonly known as: ADVIL ?Take 1 tablet (600 mg total) by mouth every 6 (six) hours. ?  ?norethindrone 0.35 MG tablet ?Commonly known as: Ortho Micronor ?Take 1 tablet (0.35 mg total) by mouth daily. ?  ?PrePLUS 27-1 MG Tabs ?Take 1 tablet by mouth daily. ?  ? ?  ? ? ? ?Discharge home in stable condition ?Infant Feeding: Breast ?Infant Disposition:home with mother ?Discharge instruction: per After Visit Summary and Postpartum booklet. ?Activity: Advance as tolerated. Pelvic rest for 6 weeks.  ?Diet: routine diet ?Future Appointments: ?Future Appointments  ?Date Time Provider [place]  ?01/09/2022  3:15 PM Clarnce Flock, MD [facility]  ?02/12/2022  1:55 PM Clarnce Flock, MD [facility]  ? ?Follow up Visit: ? Follow-up Information   ? ? Center for [employer] at [facility]. Call in 1 week(s).   ?Specialty: Obstetrics and Gynecology ?Why: Please make a one week blood pressure follow up visit ?Contact information: ?[address] ?[place] [id number] ?[phone] ? ?  ?  ? ?  ?  ? ?  ? ? ? ?Please schedule this patient for a Virtual postpartum visit in 4 weeks with the following provider: Any provider. ?Additional Postpartum F/U: BP check 1 week ?Low risk pregnancy complicated by: intrapartum GHTN ?Delivery mode:  Vaginal, Spontaneous  ?Anticipated Birth Control:   POP ? ? ?01/05/2022 ?Annalee Genta, DO ? ? ? ?

## 2022-01-03 NOTE — Anesthesia Postprocedure Evaluation (Signed)
Anesthesia Post Note ? ?Patient: Alexis Conley ? ?Procedure(s) Performed: AN AD HOC LABOR EPIDURAL ? ?  ? ?Patient location during evaluation: Mother Baby ?Anesthesia Type: Epidural ?Level of consciousness: awake and alert ?Pain management: pain level controlled ?Vital Signs Assessment: post-procedure vital signs reviewed and stable ?Respiratory status: spontaneous breathing, nonlabored ventilation and respiratory function stable ?Cardiovascular status: stable ?Postop Assessment: no headache, no backache and epidural receding ?Anesthetic complications: no ? ? ?No notable events documented. ? ?Last Vitals:  ?Vitals:  ? 01/03/22 1235 01/03/22 1416  ?BP: 125/65 120/76  ?Pulse: 89 84  ?Resp: 18   ?Temp: 37.3 ?C 37.3 ?C  ?SpO2: 99% 99%  ?  ?Last Pain:  ?Vitals:  ? 01/03/22 1416  ?TempSrc: Oral  ?PainSc: 0-No pain  ? ?Pain Goal:   ? ?  ?  ?  ?  ?  ?  ?  ? ?[name] ? ? ? ? ?

## 2022-01-04 ENCOUNTER — Other Ambulatory Visit (HOSPITAL_COMMUNITY): Payer: Self-pay

## 2022-01-04 LAB — CBC
HCT: 27.3 % — ABNORMAL LOW (ref 36.0–46.0)
Hemoglobin: 9 g/dL — ABNORMAL LOW (ref 12.0–15.0)
MCH: 26.5 pg (ref 26.0–34.0)
MCHC: 33 g/dL (ref 30.0–36.0)
MCV: 80.3 fL (ref 80.0–100.0)
Platelets: 127 10*3/uL — ABNORMAL LOW (ref 150–400)
RBC: 3.4 MIL/uL — ABNORMAL LOW (ref 3.87–5.11)
RDW: 15.1 % (ref 11.5–15.5)
WBC: 13.7 10*3/uL — ABNORMAL HIGH (ref 4.0–10.5)
nRBC: 0 % (ref 0.0–0.2)

## 2022-01-04 MED ORDER — IBUPROFEN 600 MG PO TABS
600.0000 mg | ORAL_TABLET | Freq: Four times a day (QID) | ORAL | 0 refills | Status: DC
  Filled 2022-01-04: qty 30, 8d supply, fill #0

## 2022-01-04 MED ORDER — FUROSEMIDE 20 MG PO TABS
20.0000 mg | ORAL_TABLET | Freq: Every day | ORAL | 0 refills | Status: DC
Start: 2022-01-04 — End: 2022-01-11
  Filled 2022-01-04: qty 4, 4d supply, fill #0

## 2022-01-04 MED ORDER — FUROSEMIDE 20 MG PO TABS
20.0000 mg | ORAL_TABLET | Freq: Every day | ORAL | Status: DC
  Administered 2022-01-04 – 2022-01-05 (×2): 20 mg via ORAL
  Filled 2022-01-04 (×2): qty 1

## 2022-01-04 NOTE — Lactation Note (Signed)
This note was copied from a baby's chart. ?Lactation Consultation Note ? ?Patient Name: Alexis Conley ?HFWYO'V Date: 01/04/2022 ?Reason for consult: 1st time breastfeeding;Follow-up assessment;MD order (-1% weight loss, infant has not stooled, had 3 voids since birth.) ?Age:23 hours ?P1, term female infant. ?Per mom, most feedings have been 5 or 10 minutes in length. ?LC discussed breast stimulation techniques to keep infant actively BF and not hang out at the breast. ?Mom will do breast stimulation techniques such as: breast compressions, gently stroking infant's neck and shoulders and BF infant skin to skin. ?Mom latched infant on her left breast using the football hold position, infant latched with depth, swallows heard infant BF for 20 minutes, per mom this is the longest infant has breastfeed. ?Afterwards LC reviewed hand expression and infant was given 6 mls of colostrum by spoon.  ?Mom's plan: ?1- Mom will continue to BF infant according to hunger cues, 8 to 12+ or more times skin to skin. ?2- Mom will try latch infant on both breast during a feeding. ?3- Mom knows afterwards she can hand express and give infant back EBM to help with stooling. ?4- Mom knows to call RN/LC if she has any BF questions, concerns or need further assistance with latching infant at the breast.  ?Maternal Data ?  ? ?Feeding ?Mother's Current Feeding Choice: Breast Milk ? ?LATCH Score ?Latch: Grasps breast easily, tongue down, lips flanged, rhythmical sucking. ? ?Audible Swallowing: Spontaneous and intermittent ? ?Type of Nipple: Everted at rest and after stimulation ? ?Comfort (Breast/Nipple): Soft / non-tender ? ?Hold (Positioning): Assistance needed to correctly position infant at breast and maintain latch. ? ?LATCH Score: 9 ? ? ?Lactation Tools Discussed/Used ?  ? ?Interventions ?Interventions: Breast feeding basics reviewed;Assisted with latch;Skin to skin;Hand express;Breast compression;Adjust position;Support  pillows;Position options;Expressed milk;Education;LC Services brochure ? ?Discharge ?  ? ?Consult Status ?Consult Status: Follow-up ?Date: 01/05/22 ?Follow-up type: In-patient ? ? ? ?Vicente Serene ?01/04/2022, 8:37 PM ? ? ? ?

## 2022-01-04 NOTE — Progress Notes (Signed)
POSTPARTUM PROGRESS NOTE ? ?PPD #1 ? ?Subjective: ? ?Alexis Conley is a 21 y.o. G1P1001 s/p NSVD at [redacted]w[redacted]d Today she notes no acute complaints or concerns. She denies any problems with ambulating, voiding or po intake. Denies nausea or vomiting. She has passed flatus, no BM.  Pain is minimal.  Lochia minimal ? ?Objective: ?Blood pressure 111/72, pulse 91, temperature 98 ?F (36.7 ?C), temperature source Oral, resp. rate 16, height '5\' 2"'$  (1.575 m), weight 86.6 kg, last menstrual period 03/29/2021, SpO2 100 %, unknown if currently breastfeeding. ? ?Physical Exam:  ?General: alert, cooperative and no distress ?Chest: no respiratory distress ?Heart: regular rate and rhythm ?Abdomen: soft, nontender ?Uterine Fundus: firm, below umbilicus ?DVT Evaluation: No calf swelling or tenderness ?Extremities: no edema ?Skin: warm, dry ? ?Results for orders placed or performed during the hospital encounter of 01/02/22 (from the past 24 hour(s))  ?CBC     Status: Abnormal  ? Collection Time: 01/03/22 12:08 PM  ?Result Value Ref Range  ? WBC 13.3 (H) 4.0 - 10.5 K/uL  ? RBC 3.88 3.87 - 5.11 MIL/uL  ? Hemoglobin 10.2 (L) 12.0 - 15.0 g/dL  ? HCT 31.0 (L) 36.0 - 46.0 %  ? MCV 79.9 (L) 80.0 - 100.0 fL  ? MCH 26.3 26.0 - 34.0 pg  ? MCHC 32.9 30.0 - 36.0 g/dL  ? RDW 15.0 11.5 - 15.5 %  ? Platelets 136 (L) 150 - 400 K/uL  ? nRBC 0.0 0.0 - 0.2 %  ?CBC     Status: Abnormal  ? Collection Time: 01/04/22  4:21 AM  ?Result Value Ref Range  ? WBC 13.7 (H) 4.0 - 10.5 K/uL  ? RBC 3.40 (L) 3.87 - 5.11 MIL/uL  ? Hemoglobin 9.0 (L) 12.0 - 15.0 g/dL  ? HCT 27.3 (L) 36.0 - 46.0 %  ? MCV 80.3 80.0 - 100.0 fL  ? MCH 26.5 26.0 - 34.0 pg  ? MCHC 33.0 30.0 - 36.0 g/dL  ? RDW 15.1 11.5 - 15.5 %  ? Platelets 127 (L) 150 - 400 K/uL  ? nRBC 0.0 0.0 - 0.2 %  ? ? ?Assessment/Plan: ?Alexis GEERSis a 21y.o. G1P1001 s/p NSVD at 46w0dPD#1 ?-Gestational HTN ? On Lasix daily ? BP within normal range ?-pain well controlled ?-meeting postpartum milestones  appropriately ? ?Contraception: POPs ?Feeding: breastfeeding ? ?Dispo: continue routine postpartum care ? ? LOS: 2 days  ? ?JeJanyth PupaDO ?Faculty Attending, Center for [facility]4/14/2023, 9:48 AM  ?

## 2022-01-05 MED ORDER — NORETHINDRONE 0.35 MG PO TABS: 1.0000 | ORAL_TABLET | Freq: Every day | ORAL | 3 refills | Status: DC

## 2022-01-05 MED ORDER — ACETAMINOPHEN 325 MG PO TABS: 650.0000 mg | ORAL_TABLET | ORAL | Status: DC | PRN

## 2022-01-05 NOTE — Lactation Note (Signed)
This note was copied from a baby's chart. ?Lactation Consultation Note ? ?Patient Name: Alexis Conley ?MBPJP'E Date: 01/05/2022 ?Reason for consult: Follow-up assessment ?Age:23 hours ? ?P1, Discussed good time to use breast pump. ?Reviewed engorgement care and monitoring voids/stools. ?Mother denies concerns.  ? ? ?Feeding ?Mother's Current Feeding Choice: Breast Milk ? ? ?Interventions ?Interventions: Education ? ?Discharge ?Discharge Education: Engorgement and breast care;Warning signs for feeding baby ? ?Consult Status ?Consult Status: Complete ?Date: 01/05/22 ? ? ? ?Vivianne Master Boschen ?01/05/2022, 12:40 PM ? ? ? ?

## 2022-01-06 ENCOUNTER — Telehealth (HOSPITAL_COMMUNITY): Payer: Self-pay

## 2022-01-06 ENCOUNTER — Ambulatory Visit: Payer: Self-pay

## 2022-01-06 NOTE — Lactation Note (Unsigned)
This note was copied from a baby's chart. ?Mom called for to ask questions in reference to her newborn born on [birth year]/2022. ? ?Mom noticed breastfeeding started getting painful and discontinued latching on day 4. Mom started pumping with electrical pump but denies any pain but noticed decline in milk supply. Mom stated pumped 1x yesterday and 1 x today. With pumping 3 ml one day prior and nothing today. Mom taking ibuprofen and lasix.  ? ?Mom noted pink brown and she is using nipple butter. Mom not latching at this time.  ? ?Mom encouraged to get regular pumping regimen using DEBP q 3hrs for 15 min Mom denied any pain with pumping. Mom to offer EBM first before formula as she bottle feeds her infant.  ?Mom states adequate urine and stool output noted.  ? ?Mom bottle feeding 30 ml or more with Enfamil for now. Mom follow up appt with Pediatrician in am. LC talked with Mom how to get a deeper latch, using coconut oil for nipple care and getting outpatient LC appt. ? ?[place] sent Epic message for follow up, Mom bring baby and pump parts and flanges to assess flange size as well.  ?All questions answered at the end of the call.  ?

## 2022-01-07 ENCOUNTER — Encounter: Payer: Self-pay | Admitting: *Deleted

## 2022-01-07 ENCOUNTER — Encounter: Payer: 59 | Admitting: Family Medicine

## 2022-01-08 ENCOUNTER — Other Ambulatory Visit: Payer: Self-pay

## 2022-01-08 ENCOUNTER — Telehealth: Payer: Self-pay | Admitting: Lactation Services

## 2022-01-08 ENCOUNTER — Encounter: Payer: Self-pay | Admitting: Family Medicine

## 2022-01-08 ENCOUNTER — Inpatient Hospital Stay (HOSPITAL_COMMUNITY)
Admission: EM | Admit: 2022-01-08 | Discharge: 2022-01-11 | DRG: 776 | Disposition: A | Discharge status: Home / Self Care | Payer: 59 | Attending: Obstetrics and Gynecology | Admitting: Obstetrics and Gynecology

## 2022-01-08 ENCOUNTER — Encounter (HOSPITAL_COMMUNITY): Payer: Self-pay

## 2022-01-08 ENCOUNTER — Emergency Department (HOSPITAL_COMMUNITY): Payer: 59

## 2022-01-08 DIAGNOSIS — R778 Other specified abnormalities of plasma proteins: Secondary | ICD-10-CM

## 2022-01-08 DIAGNOSIS — O9943 Diseases of the circulatory system complicating the puerperium: Secondary | ICD-10-CM | POA: Diagnosis present

## 2022-01-08 DIAGNOSIS — R748 Abnormal levels of other serum enzymes: Secondary | ICD-10-CM

## 2022-01-08 DIAGNOSIS — E876 Hypokalemia: Secondary | ICD-10-CM | POA: Diagnosis present

## 2022-01-08 DIAGNOSIS — Z8615 Personal history of latent tuberculosis infection: Secondary | ICD-10-CM

## 2022-01-08 DIAGNOSIS — Z20822 Contact with and (suspected) exposure to covid-19: Secondary | ICD-10-CM | POA: Diagnosis present

## 2022-01-08 DIAGNOSIS — R7989 Other specified abnormal findings of blood chemistry: Secondary | ICD-10-CM

## 2022-01-08 DIAGNOSIS — J9859 Other diseases of mediastinum, not elsewhere classified: Secondary | ICD-10-CM

## 2022-01-08 DIAGNOSIS — O1495 Unspecified pre-eclampsia, complicating the puerperium: Secondary | ICD-10-CM | POA: Diagnosis present

## 2022-01-08 DIAGNOSIS — I309 Acute pericarditis, unspecified: Secondary | ICD-10-CM | POA: Diagnosis present

## 2022-01-08 DIAGNOSIS — R222 Localized swelling, mass and lump, trunk: Secondary | ICD-10-CM | POA: Diagnosis present

## 2022-01-08 DIAGNOSIS — O9953 Diseases of the respiratory system complicating the puerperium: Principal | ICD-10-CM | POA: Diagnosis present

## 2022-01-08 DIAGNOSIS — O99285 Endocrine, nutritional and metabolic diseases complicating the puerperium: Secondary | ICD-10-CM | POA: Diagnosis present

## 2022-01-08 DIAGNOSIS — I10 Essential (primary) hypertension: Secondary | ICD-10-CM

## 2022-01-08 DIAGNOSIS — O99893 Other specified diseases and conditions complicating puerperium: Secondary | ICD-10-CM | POA: Diagnosis present

## 2022-01-08 DIAGNOSIS — R030 Elevated blood-pressure reading, without diagnosis of hypertension: Secondary | ICD-10-CM | POA: Diagnosis not present

## 2022-01-08 DIAGNOSIS — D649 Anemia, unspecified: Secondary | ICD-10-CM

## 2022-01-08 DIAGNOSIS — R079 Chest pain, unspecified: Secondary | ICD-10-CM

## 2022-01-08 DIAGNOSIS — O903 Peripartum cardiomyopathy: Principal | ICD-10-CM

## 2022-01-08 DIAGNOSIS — J9601 Acute respiratory failure with hypoxia: Secondary | ICD-10-CM | POA: Diagnosis present

## 2022-01-08 NOTE — Telephone Encounter (Signed)
Called and spoke with patient.  ? ?Patient reports she has a fever that started last night and was 100 degrees, this morning was 99.5 degrees. She has Ibuprofen and is taking every 6 hours and also took Tylenol.   ? ?She feels like her heart is beating fast. She does have a cough intermittently and sounds raspy. She denies runny nose or productive cough.  ? ?She does reports hard breasts. She was pumping and then started hurting so she decided to stop pumping. All symptoms started after she stopped pumping. Offered OP Lactation appointment and she declined.  ? ?She reports she has pain at the top of her stomach and hurts really bad when she moves or presses on it. She is having daily bowel movements. She is not eating well, no appetite. She is passing gas. The pain to her stomach started after she stopped pumping.  ? ?Bleeding has slowed and she is passing some small clots. There is no pain to uterus area.  ? ?Reviewed ways to dry up milk: ?ice packs ad lib or cold crushed cabbage leaves to bra ad lib.  ?Limit breast stimulation ?Wear supportive bra ?Sudafed 12 hours every 12 hours for 3 days ?Please message office back if not better in the next few days.  ? ?Patient voiced understanding.  ? ?

## 2022-01-08 NOTE — ED Triage Notes (Signed)
Pt arrives to ED POV c/o SHOB. Pt states she gave birth 5 days ago and has been [facility] ever since. Pt sats dropped in triage and is now on 2L [place]. No Hx of HTN but has been Hypertensive after pregnancy. Pt also c/o CP. ?

## 2022-01-08 NOTE — ED Notes (Signed)
PT O2 saturation 87% RA. PT placed on 2LPM [place] and O2 saturation improved to 93% ?

## 2022-01-08 NOTE — ED Provider Triage Note (Signed)
?  Emergency Medicine Provider Triage Evaluation Note ? ?MRN:  [id number]  ?Arrival date & time: 01/08/22    ?Medically screening exam initiated at 11:42 PM.   ?CC:   ?Shortness of Breath ?  ?HPI:  ?ILIZA BLANKENBECKLER is a 21 y.o. year-old female presents to the ED with chief complaint of SOB, chest pain, and fever at home.  5 days postpartum.  Onset of symptoms was yesterday.  Denies complications at birth.  Vaginal delivery. ? ?History provided by History provided by patient. ?ROS:  ?-As included in HPI ?PE:  ? ?Vitals:  ? 01/08/22 2325  ?BP: (!) 161/105  ?Pulse: 75  ?Resp: (!) 24  ?Temp: 98.9 ?F (37.2 ?C)  ?SpO2: 93%  ?  ?Ill appearing ?Increased WOB ?Diaphoretic ?On 2L [place] ?MDM:  ?Based on signs and symptoms, post partum PE, pneumonia, covid is highest on my differential. ?I've ordered labs and imaging in triage to expedite lab/diagnostic workup. ? ?11:45 PM ?Discussed case with charge nurse.  Patient needs an acute bed now.  Will be moved to 19. ? ?Patient was informed that the remainder of the evaluation will be completed by another provider, this initial triage assessment does not replace that evaluation, and the importance of remaining in the ED until their evaluation is complete. ? ?  ?Montine Circle, PA-C ?01/08/22 2345 ? ?

## 2022-01-09 ENCOUNTER — Inpatient Hospital Stay (HOSPITAL_COMMUNITY): Payer: 59

## 2022-01-09 ENCOUNTER — Emergency Department (HOSPITAL_COMMUNITY): Payer: 59

## 2022-01-09 ENCOUNTER — Ambulatory Visit: Payer: 59

## 2022-01-09 ENCOUNTER — Encounter (HOSPITAL_COMMUNITY): Payer: Self-pay | Admitting: Family Medicine

## 2022-01-09 DIAGNOSIS — O9943 Diseases of the circulatory system complicating the puerperium: Secondary | ICD-10-CM | POA: Diagnosis present

## 2022-01-09 DIAGNOSIS — E876 Hypokalemia: Secondary | ICD-10-CM

## 2022-01-09 DIAGNOSIS — I509 Heart failure, unspecified: Secondary | ICD-10-CM

## 2022-01-09 DIAGNOSIS — R7989 Other specified abnormal findings of blood chemistry: Secondary | ICD-10-CM

## 2022-01-09 DIAGNOSIS — R778 Other specified abnormalities of plasma proteins: Secondary | ICD-10-CM

## 2022-01-09 DIAGNOSIS — O99893 Other specified diseases and conditions complicating puerperium: Secondary | ICD-10-CM | POA: Diagnosis present

## 2022-01-09 DIAGNOSIS — I428 Other cardiomyopathies: Secondary | ICD-10-CM | POA: Diagnosis not present

## 2022-01-09 DIAGNOSIS — J9859 Other diseases of mediastinum, not elsewhere classified: Secondary | ICD-10-CM

## 2022-01-09 DIAGNOSIS — O1495 Unspecified pre-eclampsia, complicating the puerperium: Secondary | ICD-10-CM | POA: Diagnosis present

## 2022-01-09 DIAGNOSIS — O903 Peripartum cardiomyopathy: Secondary | ICD-10-CM | POA: Diagnosis not present

## 2022-01-09 DIAGNOSIS — I5031 Acute diastolic (congestive) heart failure: Secondary | ICD-10-CM | POA: Diagnosis not present

## 2022-01-09 DIAGNOSIS — I3139 Other pericardial effusion (noninflammatory): Secondary | ICD-10-CM | POA: Insufficient documentation

## 2022-01-09 DIAGNOSIS — R222 Localized swelling, mass and lump, trunk: Secondary | ICD-10-CM | POA: Diagnosis present

## 2022-01-09 DIAGNOSIS — I10 Essential (primary) hypertension: Secondary | ICD-10-CM | POA: Diagnosis not present

## 2022-01-09 DIAGNOSIS — Z20822 Contact with and (suspected) exposure to covid-19: Secondary | ICD-10-CM | POA: Diagnosis present

## 2022-01-09 DIAGNOSIS — J9601 Acute respiratory failure with hypoxia: Secondary | ICD-10-CM | POA: Diagnosis present

## 2022-01-09 DIAGNOSIS — R079 Chest pain, unspecified: Secondary | ICD-10-CM

## 2022-01-09 DIAGNOSIS — I309 Acute pericarditis, unspecified: Secondary | ICD-10-CM | POA: Diagnosis present

## 2022-01-09 DIAGNOSIS — O9953 Diseases of the respiratory system complicating the puerperium: Secondary | ICD-10-CM | POA: Diagnosis present

## 2022-01-09 DIAGNOSIS — R030 Elevated blood-pressure reading, without diagnosis of hypertension: Secondary | ICD-10-CM | POA: Diagnosis present

## 2022-01-09 DIAGNOSIS — O99285 Endocrine, nutritional and metabolic diseases complicating the puerperium: Secondary | ICD-10-CM | POA: Diagnosis present

## 2022-01-09 DIAGNOSIS — R748 Abnormal levels of other serum enzymes: Secondary | ICD-10-CM

## 2022-01-09 DIAGNOSIS — Z8615 Personal history of latent tuberculosis infection: Secondary | ICD-10-CM | POA: Diagnosis not present

## 2022-01-09 LAB — URINALYSIS, ROUTINE W REFLEX MICROSCOPIC
Bilirubin Urine: NEGATIVE
Glucose, UA: NEGATIVE mg/dL
Ketones, ur: 20 mg/dL — AB
Nitrite: NEGATIVE
Protein, ur: 30 mg/dL — AB
RBC / HPF: >50 RBC/hpf — ABNORMAL HIGH (ref 0–5)
Specific Gravity, Urine: 1.015 (ref 1.005–1.030)
WBC, UA: >50 WBC/hpf — ABNORMAL HIGH (ref 0–5)
pH: 6 (ref 5.0–8.0)

## 2022-01-09 LAB — BASIC METABOLIC PANEL
Anion gap: 12 (ref 5–15)
Anion gap: 9 (ref 5–15)
BUN: 11 mg/dL (ref 6–20)
BUN: 8 mg/dL (ref 6–20)
CO2: 18 mmol/L — ABNORMAL LOW (ref 22–32)
CO2: 22 mmol/L (ref 22–32)
Calcium: 8.2 mg/dL — ABNORMAL LOW (ref 8.9–10.3)
Calcium: 8.3 mg/dL — ABNORMAL LOW (ref 8.9–10.3)
Chloride: 109 mmol/L (ref 98–111)
Chloride: 112 mmol/L — ABNORMAL HIGH (ref 98–111)
Creatinine, Ser: 0.81 mg/dL (ref 0.44–1.00)
Creatinine, Ser: 0.89 mg/dL (ref 0.44–1.00)
GFR, Estimated: >60 mL/min (ref >60)
GFR, Estimated: >60 mL/min (ref >60)
Glucose, Bld: 108 mg/dL — ABNORMAL HIGH (ref 70–99)
Glucose, Bld: 89 mg/dL (ref 70–99)
Potassium: 3.3 mmol/L — ABNORMAL LOW (ref 3.5–5.1)
Potassium: 3.6 mmol/L (ref 3.5–5.1)
Sodium: 140 mmol/L (ref 135–145)
Sodium: 142 mmol/L (ref 135–145)

## 2022-01-09 LAB — ECHOCARDIOGRAM COMPLETE
AR max vel: 1.88 cm2
AV Area VTI: 2.02 cm2
AV Area mean vel: 1.88 cm2
AV Mean grad: 6 mmHg
AV Peak grad: 10.2 mmHg
Ao pk vel: 1.6 m/s
Area-P 1/2: 3.6 cm2
MV VTI: 2.61 cm2
S' Lateral: 2.8 cm

## 2022-01-09 LAB — CBC WITH DIFFERENTIAL/PLATELET
Abs Immature Granulocytes: 0.05 10*3/uL (ref 0.00–0.07)
Basophils Absolute: 0 10*3/uL (ref 0.0–0.1)
Basophils Relative: 0 %
Eosinophils Absolute: 0.1 10*3/uL (ref 0.0–0.5)
Eosinophils Relative: 1 %
HCT: 32.8 % — ABNORMAL LOW (ref 36.0–46.0)
Hemoglobin: 10.5 g/dL — ABNORMAL LOW (ref 12.0–15.0)
Immature Granulocytes: 1 %
Lymphocytes Relative: 11 %
Lymphs Abs: 1.1 10*3/uL (ref 0.7–4.0)
MCH: 25.8 pg — ABNORMAL LOW (ref 26.0–34.0)
MCHC: 32 g/dL (ref 30.0–36.0)
MCV: 80.6 fL (ref 80.0–100.0)
Monocytes Absolute: 0.5 10*3/uL (ref 0.1–1.0)
Monocytes Relative: 5 %
Neutro Abs: 8.3 10*3/uL — ABNORMAL HIGH (ref 1.7–7.7)
Neutrophils Relative %: 82 %
Platelets: 248 10*3/uL (ref 150–400)
RBC: 4.07 MIL/uL (ref 3.87–5.11)
RDW: 15.4 % (ref 11.5–15.5)
WBC: 10.1 10*3/uL (ref 4.0–10.5)
nRBC: 0 % (ref 0.0–0.2)

## 2022-01-09 LAB — PROCALCITONIN: Procalcitonin: <0.1 ng/mL

## 2022-01-09 LAB — TROPONIN I (HIGH SENSITIVITY)
Troponin I (High Sensitivity): 26 ng/L — ABNORMAL HIGH (ref <18)
Troponin I (High Sensitivity): 30 ng/L — ABNORMAL HIGH (ref <18)

## 2022-01-09 LAB — HEPATIC FUNCTION PANEL
ALT: 101 U/L — ABNORMAL HIGH (ref 0–44)
AST: 88 U/L — ABNORMAL HIGH (ref 15–41)
Albumin: 2.8 g/dL — ABNORMAL LOW (ref 3.5–5.0)
Alkaline Phosphatase: 156 U/L — ABNORMAL HIGH (ref 38–126)
Bilirubin, Direct: <0.1 mg/dL (ref 0.0–0.2)
Total Bilirubin: 0.5 mg/dL (ref 0.3–1.2)
Total Protein: 6 g/dL — ABNORMAL LOW (ref 6.5–8.1)

## 2022-01-09 LAB — RESP PANEL BY RT-PCR (FLU A&B, COVID) ARPGX2
Influenza A by PCR: NEGATIVE
Influenza B by PCR: NEGATIVE
SARS Coronavirus 2 by RT PCR: NEGATIVE

## 2022-01-09 LAB — MAGNESIUM: Magnesium: 2 mg/dL (ref 1.7–2.4)

## 2022-01-09 LAB — LACTIC ACID, PLASMA
Lactic Acid, Venous: 1.3 mmol/L (ref 0.5–1.9)
Lactic Acid, Venous: 1.1 mmol/L (ref 0.5–1.9)

## 2022-01-09 LAB — BRAIN NATRIURETIC PEPTIDE: B Natriuretic Peptide: 617.9 pg/mL — ABNORMAL HIGH (ref 0.0–100.0)

## 2022-01-09 LAB — SEDIMENTATION RATE: Sed Rate: 53 mm/hr — ABNORMAL HIGH (ref 0–22)

## 2022-01-09 LAB — C-REACTIVE PROTEIN: CRP: 12.4 mg/dL — ABNORMAL HIGH (ref <1.0)

## 2022-01-09 MED ORDER — FUROSEMIDE 10 MG/ML IJ SOLN
40.0000 mg | Freq: Three times a day (TID) | INTRAMUSCULAR | Status: DC
  Administered 2022-01-09: 40 mg via INTRAVENOUS
  Filled 2022-01-09: qty 4

## 2022-01-09 MED ORDER — LACTATED RINGERS IV SOLN: INTRAVENOUS | Status: DC

## 2022-01-09 MED ORDER — MAGNESIUM SULFATE BOLUS VIA INFUSION
4.0000 g | Freq: Once | INTRAVENOUS | Status: AC
  Administered 2022-01-09: 4 g via INTRAVENOUS
  Filled 2022-01-09: qty 1000

## 2022-01-09 MED ORDER — FUROSEMIDE 20 MG PO TABS
20.0000 mg | ORAL_TABLET | Freq: Two times a day (BID) | ORAL | Status: DC
  Administered 2022-01-09 – 2022-01-11 (×4): 20 mg via ORAL
  Filled 2022-01-09 (×4): qty 1

## 2022-01-09 MED ORDER — LABETALOL HCL 5 MG/ML IV SOLN: 20.0000 mg | INTRAVENOUS | Status: DC | PRN

## 2022-01-09 MED ORDER — NITROGLYCERIN 2 % TD OINT
1.0000 [in_us] | TOPICAL_OINTMENT | Freq: Once | TRANSDERMAL | Status: AC
  Administered 2022-01-09: 1 [in_us] via TOPICAL
  Filled 2022-01-09: qty 1

## 2022-01-09 MED ORDER — NIFEDIPINE ER OSMOTIC RELEASE 30 MG PO TB24
30.0000 mg | ORAL_TABLET | Freq: Every day | ORAL | Status: DC
Start: 2022-01-09 — End: 2022-01-11
  Administered 2022-01-09 – 2022-01-11 (×3): 30 mg via ORAL
  Filled 2022-01-09 (×3): qty 1

## 2022-01-09 MED ORDER — MAGNESIUM SULFATE 40 GM/1000ML IV SOLN
2.0000 g/h | INTRAVENOUS | Status: DC
  Administered 2022-01-09 – 2022-01-10 (×3): 2 g/h via INTRAVENOUS
  Filled 2022-01-09 (×4): qty 1000

## 2022-01-09 MED ORDER — POTASSIUM CHLORIDE CRYS ER 20 MEQ PO TBCR
40.0000 meq | EXTENDED_RELEASE_TABLET | Freq: Once | ORAL | Status: AC
Start: 2022-01-09 — End: 2022-01-09
  Administered 2022-01-09: 40 meq via ORAL
  Filled 2022-01-09: qty 2

## 2022-01-09 MED ORDER — ENOXAPARIN SODIUM 40 MG/0.4ML IJ SOSY
40.0000 mg | PREFILLED_SYRINGE | INTRAMUSCULAR | Status: DC
  Administered 2022-01-09 – 2022-01-11 (×3): 40 mg via SUBCUTANEOUS
  Filled 2022-01-09 (×3): qty 0.4

## 2022-01-09 MED ORDER — MAGNESIUM SULFATE 2 GM/50ML IV SOLN
2.0000 g | Freq: Once | INTRAVENOUS | Status: AC
Start: 2022-01-09 — End: 2022-01-09
  Administered 2022-01-09: 2 g via INTRAVENOUS
  Filled 2022-01-09: qty 50

## 2022-01-09 MED ORDER — FUROSEMIDE 10 MG/ML IJ SOLN
40.0000 mg | Freq: Once | INTRAMUSCULAR | Status: AC
  Administered 2022-01-09: 40 mg via INTRAVENOUS
  Filled 2022-01-09: qty 4

## 2022-01-09 MED ORDER — LABETALOL HCL 5 MG/ML IV SOLN: 40.0000 mg | INTRAVENOUS | Status: DC | PRN

## 2022-01-09 MED ORDER — LABETALOL HCL 5 MG/ML IV SOLN: 80.0000 mg | INTRAVENOUS | Status: DC | PRN

## 2022-01-09 MED ORDER — HYDRALAZINE HCL 20 MG/ML IJ SOLN: 10.0000 mg | INTRAMUSCULAR | Status: DC | PRN

## 2022-01-09 MED ORDER — IOHEXOL 350 MG/ML SOLN
60.0000 mL | Freq: Once | INTRAVENOUS | Status: AC | PRN
  Administered 2022-01-09: 60 mL via INTRAVENOUS

## 2022-01-09 MED ORDER — METOPROLOL TARTRATE 5 MG/5ML IV SOLN
5.0000 mg | Freq: Once | INTRAVENOUS | Status: AC
  Administered 2022-01-09: 5 mg via INTRAVENOUS
  Filled 2022-01-09: qty 5

## 2022-01-09 NOTE — H&P (Addendum)
?History and Physical  ? ? ?Alexis Conley FPO:[id number] DOB: 06-Jul-2001 DOA: 01/08/2022 ? ?PCP: Clarnce Flock, MD ? ?Patient coming from: Home ? ?Chief Complaint: Shortness of breath ? ?HPI: Alexis Conley is a 21 y.o. female with medical history significant of latent TB, recent pregnancy (vaginal delivery on 01/02/2022) discharged on Lasix x5 days presenting to the ED with complaints of shortness of breath, chest pain, cough, and fever. ? ?Hypertensive with blood pressure 161/105 on arrival.  SPO2 87% on room air at triage, sats initially improved on 2 L supplemental oxygen but later while in the ED she became tachycardic to the 140s and desatted to 87%, placed on 4 L supplemental oxygen.  WBC 10.1, hemoglobin 10.5 (stable), MCV 80.6, platelet count 248k.  Sodium 142, potassium 3.3, chloride 112, bicarb 18, anion gap 12, BUN 11, creatinine 0.8, glucose 89.  UA with negative nitrite, moderate leukocytes, and microscopy showing greater than 50 RBCs, greater than 50 WBCs, and rare bacteria.  COVID and influenza PCR negative.  Blood cultures drawn.  High-sensitivity troponin 26 >30.  EKG not suggestive of ACS.  BNP 617.  Albumin 2.8.  AST 88, ALT 101, alk phos 156, T. bili 0.5.  Lactic acid 1.3.   ? ?CTA chest showing: ?"IMPRESSION: ?1. Slight cardiomegaly without significant venous distention. No ?pulmonary arterial dilatation or visible embolus. ?2. 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 Further evaluation recommended. PET-CT ?may be helpful. ?3. Generalized interstitial edema with a basal gradient with diffuse ?upper-zone-predominant patchy to confluent dense alveolar ?consolidations. This could all be due to interstitial and alveolar ?pulmonary edema whether noncardiogenic or due to ARDS, or could be ?due to a combination of pulmonary edema and pneumonia. Pulmonary ?hemorrhage and bronchoalveolar carcinoma could produce similar ?opacities but would not  typically be this extensive. Clinical ?correlation and short interval follow-up CT recommended. ?4. There are small layering pleural effusions ?right-greater-than-left." ? ?Medications administered in the ED include IV Lasix 40 mg, nitroglycerin ointment, oral potassium 40 mEq,, IV magnesium 2 g, and IV Lopressor 5 mg. ? ?Cardiology consulted. ? ?Patient states her legs were swollen during pregnancy but now they have become increasingly more swollen despite taking Lasix x4 days.  Reports 2-day history of progressively worsening dyspnea, cough, wheezing, and orthopnea.  She had a temperature of 100 ?F once at home but no longer having any fevers.  Denies history of asthma.  She is also having constant mild pressure-like central chest pain for the past 2 days.  States her blood pressure was normal throughout pregnancy but after she delivered, she was told that her blood pressure was high.  No other complaints. ? ?Review of Systems:  ?Review of Systems  ?All other systems reviewed and are negative. ? ?Past Medical History:  ?Diagnosis Date  ? Eczema   ? Heart murmur   ? When younger  ? TB lung, latent   ? ? ?Past Surgical History:  ?Procedure Laterality Date  ? NO PAST SURGERIES    ? ? ? reports that she has never smoked. She has never used smokeless tobacco. She reports that she does not drink alcohol and does not use drugs. ? ?Allergies  ?Allergen Reactions  ? Peanut-Containing Drug Products Anaphylaxis  ? Shellfish Allergy Anaphylaxis  ? Tomato Hives  ? Mushroom Extract Complex   ?  Childhood allergy.... Does not know reaction.   ? ? ?Family History  ?Problem Relation Age of Onset  ? Healthy  Mother   ? Healthy Father   ? ? ?Prior to Admission medications   ?Medication Sig Start Date End Date Taking? Authorizing Provider  ?acetaminophen (TYLENOL) 325 MG tablet Take 2 tablets (650 mg total) by mouth every 4 (four) hours as needed (for pain scale < 4). 01/05/22   Janyth Pupa, DO  ?cyclobenzaprine (FLEXERIL) 10 MG  tablet Take 1 tablet (10 mg total) by mouth 2 (two) times daily as needed for muscle spasms. 11/27/21   Wende Mott, CNM  ?furosemide (LASIX) 20 MG tablet Take 1 tablet (20 mg total) by mouth daily. 01/04/22   Cresenzo-Dishmon, Joaquim Lai, CNM  ?ibuprofen (ADVIL) 600 MG tablet Take 1 tablet (600 mg total) by mouth every 6 (six) hours. 01/04/22   Cresenzo-Dishmon, Joaquim Lai, CNM  ?norethindrone (ORTHO MICRONOR) 0.35 MG tablet Take 1 tablet (0.35 mg total) by mouth daily. 01/05/22 12/31/22  Janyth Pupa, DO  ?Prenatal Vit-Fe Fumarate-FA (PREPLUS) 27-1 MG TABS Take 1 tablet by mouth daily. 05/16/21   Burleson, Rona Ravens, NP  ?fluticasone (FLONASE) 50 MCG/ACT nasal spray Place 1-2 sprays into both nostrils daily. 10/05/20 02/22/21  Wieters, Hallie C, PA-C  ?norgestimate-ethinyl estradiol (SPRINTEC 28) 0.25-35 MG-MCG tablet Take 1 tablet by mouth daily. 03/31/20 08/11/20  Mesner, Corene Cornea, MD  ? ? ?Physical Exam: ?Vitals:  ? 01/09/22 0010 01/09/22 0130 01/09/22 0200 01/09/22 0225  ?BP: (!) 164/109 (!) 153/101 (!) 138/94 (!) 150/114  ?Pulse: 82 92 (!) 105 (!) 128  ?Resp: (!) 37 (!) 41 14 (!) 27  ?Temp:      ?TempSrc:      ?SpO2: 94% 93% 90% 92%  ? ? ?Physical Exam ?Vitals reviewed.  ?Constitutional:   ?   General: She is not in acute distress. ?HENT:  ?   Head: Normocephalic and atraumatic.  ?Eyes:  ?   Extraocular Movements: Extraocular movements intact.  ?   Conjunctiva/sclera: Conjunctivae normal.  ?Cardiovascular:  ?   Rate and Rhythm: Normal rate and regular rhythm.  ?   Pulses: Normal pulses.  ?Pulmonary:  ?   Effort: Pulmonary effort is normal. No respiratory distress.  ?   Breath sounds: No wheezing or rales.  ?Abdominal:  ?   General: Bowel sounds are normal. There is no distension.  ?   Palpations: Abdomen is soft.  ?   Tenderness: There is no abdominal tenderness.  ?Musculoskeletal:  ?   Cervical back: Normal range of motion and neck supple.  ?   Right lower leg: Edema present.  ?   Left lower leg: Edema present.  ?Skin: ?    General: Skin is warm and dry.  ?Neurological:  ?   General: No focal deficit present.  ?   Mental Status: She is alert and oriented to person, place, and time.  ?  ? ?Labs on Admission: I have personally reviewed following labs and imaging studies ? ?CBC: ?Recent Labs  ?Lab 01/02/22 ?5621 01/03/22 ?1208 01/04/22 ?0421 01/08/22 ?2354  ?WBC 6.5 13.3* 13.7* 10.1  ?NEUTROABS  --   --   --  8.3*  ?HGB 11.1* 10.2* 9.0* 10.5*  ?HCT 33.6* 31.0* 27.3* 32.8*  ?MCV 79.8* 79.9* 80.3 80.6  ?PLT 146* 136* 127* 248  ? ?Basic Metabolic Panel: ?Recent Labs  ?Lab 01/08/22 ?2354  ?NA 142  ?K 3.3*  ?CL 112*  ?CO2 18*  ?GLUCOSE 89  ?BUN 11  ?CREATININE 0.81  ?CALCIUM 8.3*  ? ?GFR: ?Estimated Creatinine Clearance: 110.3 mL/min (by C-G formula based on SCr of 0.81 mg/dL). ?Liver  Function Tests: ?Recent Labs  ?Lab 01/09/22 ?2707  ?AST 88*  ?ALT 101*  ?ALKPHOS 156*  ?BILITOT 0.5  ?PROT 6.0*  ?ALBUMIN 2.8*  ? ?No results for input(s): LIPASE, AMYLASE in the last 168 hours. ?No results for input(s): AMMONIA in the last 168 hours. ?Coagulation Profile: ?No results for input(s): INR, PROTIME in the last 168 hours. ?Cardiac Enzymes: ?No results for input(s): CKTOTAL, CKMB, CKMBINDEX, TROPONINI in the last 168 hours. ?BNP (last 3 results) ?No results for input(s): PROBNP in the last 8760 hours. ?HbA1C: ?No results for input(s): HGBA1C in the last 72 hours. ?CBG: ?No results for input(s): GLUCAP in the last 168 hours. ?Lipid Profile: ?No results for input(s): CHOL, HDL, LDLCALC, TRIG, CHOLHDL, LDLDIRECT in the last 72 hours. ?Thyroid Function Tests: ?No results for input(s): TSH, T4TOTAL, FREET4, T3FREE, THYROIDAB in the last 72 hours. ?Anemia Panel: ?No results for input(s): VITAMINB12, FOLATE, FERRITIN, TIBC, IRON, RETICCTPCT in the last 72 hours. ?Urine analysis: ?   ?Component Value Date/Time  ? [place] YELLOW 01/08/2022 2342  ? APPEARANCEUR HAZY (A) 01/08/2022 2342  ? LABSPEC 1.015 01/08/2022 2342  ? PHURINE 6.0 01/08/2022 2342  ?  [place] NEGATIVE 01/08/2022 2342  ? HGBUR LARGE (A) 01/08/2022 2342  ? [place] NEGATIVE 01/08/2022 2342  ? BILIRUBINUR negative 10/05/2020 2004  ? KETONESUR 20 (A) 01/08/2022 2342  ? PROTEINUR 30 (A) 01/08/2022

## 2022-01-09 NOTE — Progress Notes (Signed)
?  Echocardiogram ?2D Echocardiogram has been performed. ? ?Alexis Conley ?01/09/2022, 9:33 AM ?

## 2022-01-09 NOTE — Progress Notes (Signed)
Heart Failure Navigator Progress Note ? ?Assessed for Heart & Vascular TOC clinic readiness.  ?Patient does not meet criteria due to OB patient ( ? Pre-eclampsia).  ? ? ? ?Earnestine Leys, BSN, RN ?Heart Failure Nurse Navigator ?Secure Chat Only   ?

## 2022-01-09 NOTE — ED Notes (Signed)
Patient HR went up to 140bpm , sats went down to 87%. PLACED patientn on 4L OF 02 VIA NASAL [place] . MD glick made aware.  ?

## 2022-01-09 NOTE — Progress Notes (Signed)
Reviewed echocardiogram at bedside. ?She has normal right and left ventricular systolic function. ?There is a small to moderate pericardial effusion particularly lateral to the right ventricle.  No evidence of left ventricular hypertrophy or significant valvular abnormalities.  No tamponade. ?Due to her pleuritic chest discomfort and the presence of the small pericardial effusion, it is possible that she has acute pericarditis.  However there is severely elevated blood pressure in presentation with pulmonary edema is more in keeping with hypertensive urgency/delayed preeclampsia. ?Agree with OB/GYN service plans for admission for aggressive treatment of hypertension, administration of magnesium and continued diuretics. ?We will check inflammatory markers to see if there is support for a diagnosis of acute pericarditis as well.  With normal regional left ventricular wall motion and normal LVEF it should be safe to treat this with NSAIDs, if necessary (although NSAIDs may make it harder to bring down her elevated blood pressure). ? ?

## 2022-01-09 NOTE — ED Provider Notes (Addendum)
[facility] EMERGENCY DEPARTMENT Provider Note   CSN: [id number] Arrival date & time: 01/08/22  2316     History  Chief Complaint  Patient presents with   Shortness of Breath    Alexis Conley is a 21 y.o. female.  The history is provided by the patient.  Shortness of Breath She has history of latent pulmonary tuberculosis, alpha thalassemia carrier status, and is 5 days postpartum.  She comes in with onset 2 days ago of shortness of breath with some chest pain and cough productive of some clear sputum.  She has had fevers at home as high as 100.0 with associated chills but no sweats.  She states that the pregnancy was uncomplicated, but he did start running high blood pressures after delivery.  She denies any leg swelling and was not told about proteinuria.  She is breast-feeding.   Home Medications Prior to Admission medications   Medication Sig Start Date End Date Taking? Authorizing Provider  acetaminophen (TYLENOL) 325 MG tablet Take 2 tablets (650 mg total) by mouth every 4 (four) hours as needed (for pain scale < 4). 01/05/22   Myna Hidalgo, DO  cyclobenzaprine (FLEXERIL) 10 MG tablet Take 1 tablet (10 mg total) by mouth 2 (two) times daily as needed for muscle spasms. 11/27/21   Rolm Bookbinder, CNM  furosemide (LASIX) 20 MG tablet Take 1 tablet (20 mg total) by mouth daily. 01/04/22   Cresenzo-Dishmon, Scarlette Calico, CNM  ibuprofen (ADVIL) 600 MG tablet Take 1 tablet (600 mg total) by mouth every 6 (six) hours. 01/04/22   Cresenzo-Dishmon, Scarlette Calico, CNM  norethindrone (ORTHO MICRONOR) 0.35 MG tablet Take 1 tablet (0.35 mg total) by mouth daily. 01/05/22 12/31/22  Myna Hidalgo, DO  Prenatal Vit-Fe Fumarate-FA (PREPLUS) 27-1 MG TABS Take 1 tablet by mouth daily. 05/16/21   Burleson, Brand Males, NP  fluticasone (FLONASE) 50 MCG/ACT nasal spray Place 1-2 sprays into both nostrils daily. 10/05/20 02/22/21  Wieters, Hallie C, PA-C  norgestimate-ethinyl estradiol (SPRINTEC 28)  0.25-35 MG-MCG tablet Take 1 tablet by mouth daily. 03/31/20 08/11/20  Mesner, Barbara Cower, MD      Allergies    Peanut-containing drug products, Shellfish allergy, Tomato, and Mushroom extract complex    Review of Systems   Review of Systems  Respiratory:  Positive for shortness of breath.   All other systems reviewed and are negative.  Physical Exam Updated Vital Signs BP (!) 164/109   Pulse 82   Temp 98.9 F (37.2 C) (Oral)   Resp (!) 37   LMP 03/29/2021 (Exact Date)   SpO2 94%  Physical Exam Vitals and nursing note reviewed.  21 year old female, resting comfortably and in no acute distress. Vital signs are significant for elevated respiratory rate and blood pressure. Oxygen saturation is 94%, which is normal, but she is on nasal oxygen. Head is normocephalic and atraumatic. PERRLA, EOMI. Oropharynx is clear. Neck is nontender and supple without adenopathy or JVD. Back is nontender and there is no CVA tenderness. Lungs are clear without rales, wheezes, or rhonchi. Chest is nontender. Heart has regular rate and rhythm with 2/6 systolic flow murmur. Abdomen is soft, flat, nontender. Extremities have 1-2+ edema, full range of motion is present. Skin is warm and dry without rash. Neurologic: Mental status is normal, cranial nerves are intact, there are no motor or sensory deficits.  ED Results / Procedures / Treatments   Labs (all labs ordered are listed, but only abnormal results are displayed) Labs Reviewed  CBC  WITH DIFFERENTIAL/PLATELET - Abnormal; Notable for the following components:      Result Value   Hemoglobin 10.5 (*)    HCT 32.8 (*)    MCH 25.8 (*)    Neutro Abs 8.3 (*)    All other components within normal limits  BASIC METABOLIC PANEL - Abnormal; Notable for the following components:   Potassium 3.3 (*)    Chloride 112 (*)    CO2 18 (*)    Calcium 8.3 (*)    All other components within normal limits  URINALYSIS, ROUTINE W REFLEX MICROSCOPIC - Abnormal; Notable  for the following components:   APPearance HAZY (*)    Hgb urine dipstick LARGE (*)    Ketones, ur 20 (*)    Protein, ur 30 (*)    Leukocytes,Ua MODERATE (*)    RBC / HPF >50 (*)    WBC, UA >50 (*)    Bacteria, UA RARE (*)    All other components within normal limits  BRAIN NATRIURETIC PEPTIDE - Abnormal; Notable for the following components:   B Natriuretic Peptide 617.9 (*)    All other components within normal limits  HEPATIC FUNCTION PANEL - Abnormal; Notable for the following components:   Total Protein 6.0 (*)    Albumin 2.8 (*)    AST 88 (*)    ALT 101 (*)    Alkaline Phosphatase 156 (*)    All other components within normal limits  BASIC METABOLIC PANEL - Abnormal; Notable for the following components:   Glucose, Bld 108 (*)    Calcium 8.2 (*)    All other components within normal limits  TROPONIN I (HIGH SENSITIVITY) - Abnormal; Notable for the following components:   Troponin I (High Sensitivity) 26 (*)    All other components within normal limits  TROPONIN I (HIGH SENSITIVITY) - Abnormal; Notable for the following components:   Troponin I (High Sensitivity) 30 (*)    All other components within normal limits  RESP PANEL BY RT-PCR (FLU A&B, COVID) ARPGX2  CULTURE, BLOOD (ROUTINE X 2)  CULTURE, BLOOD (ROUTINE X 2)  URINE CULTURE  LACTIC ACID, PLASMA  LACTIC ACID, PLASMA  MAGNESIUM  PROCALCITONIN    EKG EKG Interpretation  Date/Time:  Tuesday January 08 2022 23:24:42 EDT Ventricular Rate:  73 PR Interval:  154 QRS Duration: 64 QT Interval:  394 QTC Calculation: 434 R Axis:   74 Text Interpretation: Normal sinus rhythm Normal ECG No previous ECGs available Confirmed by Dione Booze ([id number]) on 01/08/2022 11:37:55 PM  Radiology DG Chest Port 1 View  Result Date: 01/09/2022 CLINICAL DATA:  Five days postpartum, presents with shortness of breath. EXAM: PORTABLE CHEST 1 VIEW COMPARISON:  PA single view 05/02/2020. FINDINGS: The heart silhouette has become mildly  enlarged but the central vessels appear to be normal in caliber. There are interstitial and patchy opacities of the lung fields from the level of the aortic arch down, left-greater-than-right, small left and trace right pleural effusions. Lung apices are relatively clear. The lung opacities combined with interstitial consolidation could be due to pneumonia, edema or combination. The mediastinum is normally outlined.  There is no aortic ectasia. Regional skeletal structures unremarkable apart from slight thoracic levoscoliosis. IMPRESSION: 1. Mild cardiomegaly, with normal caliber central vasculature. 2. Interstitial and patchy opacities of the left-greater-than-right lung fields consistent with pneumonia, edema or combination, with small left and trace right pleural effusions. 3. Clinical correlation and radiographic follow-up recommended. Electronically Signed   By: Earlean Shawl.D.  On: 01/09/2022 00:20    Procedures Procedures  Cardiac monitor shows normal sinus rhythm, per my interpretation.  Medications Ordered in ED Medications - No data to display  ED Course/ Medical Decision Making/ A&P                           Medical Decision Making Amount and/or Complexity of Data Reviewed Labs: ordered.  Risk Prescription drug management. Decision regarding hospitalization.   Shortness of breath in the postpartum period.  Differential is broad, but includes, but is not limited to, pulmonary embolism, pneumonia, acute heart failure from peripartum cardiomyopathy.  Although she states she had a fever at home, does not technically meet the criteria for fever.  However, we will check sepsis labs including blood cultures and lactic acid.  BNP has been ordered to evaluate for acute heart failure.  Chest x-ray shows cardiomegaly with interstitial opacities in both lungs-left greater than right.  This is consistent with heart failure or pneumonia.  I have independently viewed the images, and agree with  the radiologist's interpretation.  CT angiogram has been ordered.  Old records are reviewed, apparently she had oxygen desaturation in triage although it is not recorded what her oxygen saturation was.  Last prenatal visit she had trace edema and urine had small amount of protein.  Today, urinalysis shows small amount of protein as well as greater than 50 RBCs and greater than 50 WBCs likely from vaginal contamination.  I do not feel this represents a UTI.  Hemoglobin is increased compared with baseline although still anemic, platelet count is normal.  Basic metabolic panel shows mild hypokalemia and she is given oral potassium.  There is normal BUN and creatinine.  With elevated blood pressure today as well as peripheral edema, need to consider preeclampsia.  We will need to add hepatic function panel to assess liver enzymes.  However, with normal platelets, HELLP syndrome is unlikely.  ECG is normal.  We will check magnesium level as well.  Hepatic function panel does show elevated alkaline phosphatase and transaminases but normal bilirubin.  Troponin is mildly elevated, BNP is moderately elevated consistent with peripartum cardiomyopathy with heart failure.  Troponin elevation is felt to be secondary to demand ischemia and not sepsis.  She was given furosemide and metoprolol intravenously.  Case is discussed with Dr. Loney Loh of [facility], who agrees to admit the patient.  Case also discussed with Dr. Julianne Handler of cardiology service who agrees to see the patient in consultation.  CRITICAL CARE Performed by: Dione Booze Total critical care time: 65 minutes Critical care time was exclusive of separately billable procedures and treating other patients. Critical care was necessary to treat or prevent imminent or life-threatening deterioration. Critical care was time spent personally by me on the following activities: development of treatment plan with patient and/or surrogate as well as nursing,  discussions with consultants, evaluation of patient's response to treatment, examination of patient, obtaining history from patient or surrogate, ordering and performing treatments and interventions, ordering and review of laboratory studies, ordering and review of radiographic studies, pulse oximetry and re-evaluation of patient's condition.  Final Clinical Impression(s) / ED Diagnoses Final diagnoses:  Peripartum cardiomyopathy  Elevated liver function tests  Normocytic anemia  Elevated blood pressure reading without diagnosis of hypertension    Rx / DC Orders ED Discharge Orders     None         Dione Booze, MD 01/09/22 [phone]  Dione Booze, MD 01/09/22 [phone]

## 2022-01-09 NOTE — TOC Initial Note (Signed)
Transition of Care (TOC) - Initial/Assessment Note  ? ? ?Patient Details  ?Name: Alexis Conley ?MRN: [id number] ?Date of Birth: 04-24-2001 ? ?Transition of Care (TOC) CM/SW Contact:    ?Carles Collet, RN ?Phone Number: ?01/09/2022, 2:20 PM ? ?Clinical Narrative:              Spoke w patient at bedside. Independent patient from home admitted with post partum HTN. EF 60-65%.   ?Discussed heart healthy diet, and obtaining a home BP device to monitor BP after DC. ? ?Transition of Care Department [facility]) has reviewed patient and no TOC needs have been identified at this time. We will continue to monitor patient advancement through interdisciplinary progression rounds. If new patient transition needs arise, please place a TOC consult. ?  ? ? ?Expected Discharge Plan: Home/Self Care ?Barriers to Discharge: Continued Medical Work up ? ? ?Patient Goals and CMS Choice ?Patient states their goals for this hospitalization and ongoing recovery are:: to go home ?  ?Choice offered to / list presented to : NA ? ?Expected Discharge Plan and Services ?Expected Discharge Plan: Home/Self Care ?  ?Discharge Planning Services: CM Consult ?  ?  ?Expected Discharge Date: 01/10/22               ?  ?  ?  ?  ?  ?  ?  ?  ?  ?  ? ?Prior Living Arrangements/Services ?  ?  ?  ?       ?  ?  ?  ?  ? ?Activities of Daily Living ?Home Assistive Devices/Equipment: Eyeglasses ?ADL Screening (condition at time of admission) ?Patient's cognitive ability adequate to safely complete daily activities?: Yes ?Is the patient deaf or have difficulty hearing?: No ?Does the patient have difficulty seeing, even when wearing glasses/contacts?: No ?Does the patient have difficulty concentrating, remembering, or making decisions?: No ?Patient able to express need for assistance with ADLs?: Yes ?Does the patient have difficulty dressing or bathing?: No ?Independently performs ADLs?: Yes (appropriate for developmental age) ?Does the patient have difficulty walking or  climbing stairs?: No ?Weakness of Legs: None ?Weakness of Arms/Hands: None ? ?Permission Sought/Granted ?  ?  ?   ?   ?   ?   ? ?Emotional Assessment ?  ?  ?  ?  ?  ?  ? ?Admission diagnosis:  Elevated blood pressure reading without diagnosis of hypertension [R03.0] ?Pre-eclampsia [O14.90] ?Normocytic anemia [D64.9] ?Peripartum cardiomyopathy [O90.3] ?Elevated liver function tests [R79.89] ?Acute respiratory failure with hypoxia ([place]) [J96.01] ?Patient Active Problem List  ? Diagnosis Date Noted  ? Acute respiratory failure with hypoxia ([place]) 01/09/2022  ? Chest pain 01/09/2022  ? Elevated troponin 01/09/2022  ? Hypokalemia 01/09/2022  ? Hypertension 01/09/2022  ? Elevated liver enzymes 01/09/2022  ? 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 Preeclampsia in postpartum period 01/09/2022  ? Acute diastolic heart failure ([place])   ? Pericardial effusion   ? Gestational hypertension 01/03/2022  ? PROM (premature rupture of membranes) 01/02/2022  ? Alpha thalassemia silent carrier 09/04/2021  ? 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 Supervision of high risk pregnancy, antepartum 06/13/2021  ? ?PCP:  Clarnce Flock, MD ?Pharmacy:   ?[facility] [place], [place] ?[place] ?[place] [address] ?Phone: [phone] Fax: [phone] ? ?[facility] ?[address] ?[place] [address] ?Phone: [phone] Fax: [phone] ? ? ? ? ?Social Determinants of Health (  SDOH) Interventions ?  ? ?Readmission Risk Interventions ?   ? View : No data to display.  ?  ?  ?  ? ? ? ?

## 2022-01-09 NOTE — H&P (Signed)
? ?FACULTY PRACTICE ANTEPARTUM ADMISSION HISTORY AND PHYSICAL NOTE ? ? ?History of Present Illness: ?Alexis Conley is a 21 y.o. G1P1001 7 days postpartum admitted for postpartum preeclampsia and chest pain.  Pt had a uncomplicated vaginal delivery.  She did have a designation of gestational hypertension, but did not need meds for control.  Pt was discharged with po lasix.  Pt noted she started having chest pain without radiation on Monday night with some shortness of breath.  She was seen in the ED, but initial emphasis seems to have been on the chest pain and not her postpartum elevated blood pressure.  Cardiovascular workup was started along with cardiology consult. Pt did receive 40 mg of lasix IV.  Pt denies headache, visual changes and RUQ pain. ? ?Patient Active Problem List  ? Diagnosis Date Noted  ? Acute respiratory failure with hypoxia ([place]) 01/09/2022  ? Chest pain 01/09/2022  ? Elevated troponin 01/09/2022  ? Hypokalemia 01/09/2022  ? Hypertension 01/09/2022  ? Elevated liver enzymes 01/09/2022  ? 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 Preeclampsia in postpartum period 01/09/2022  ? Acute diastolic heart failure ([place])   ? Pericardial effusion   ? Gestational hypertension 01/03/2022  ? PROM (premature rupture of membranes) 01/02/2022  ? Alpha thalassemia silent carrier 09/04/2021  ? 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 Supervision of high risk pregnancy, antepartum 06/13/2021  ? ? ?Past Medical History:  ?Diagnosis Date  ? Eczema   ? Heart murmur   ? When younger  ? TB lung, latent   ? ? ?Past Surgical History:  ?Procedure Laterality Date  ? NO PAST SURGERIES    ? ? ?OB History  ?Gravida Para Term Preterm AB Living  ?'1 1 1     1  '$ ?SAB IAB Ectopic Multiple Live Births  ?      0 1  ?  ?# Outcome Date GA Lbr Len/2nd Weight Sex Delivery Anes PTL Lv  ?1 Term 01/03/22 [id number] / 03:38 3445 g F Vag-Spont EPI  LIV  ? ? ?Social History  ? ?Socioeconomic History  ? Marital status: Single  ?  Spouse name: Not on file  ?  Number of children: Not on file  ? Years of education: Not on file  ? Highest education level: Not on file  ?Occupational History  ? Not on file  ?Tobacco Use  ? Smoking status: Never  ? Smokeless tobacco: Never  ?Vaping Use  ? Vaping Use: Never used  ?Substance and Sexual Activity  ? Alcohol use: No  ? Drug use: No  ? Sexual activity: Not Currently  ?  Birth control/protection: None  ?Other Topics Concern  ? Not on file  ?Social History Narrative  ? Not on file  ? ?Social Determinants of Health  ? ?Financial Resource Strain: Not on file  ?Food Insecurity: No Food Insecurity  ? Worried About RCharity fundraiserin the Last Year: Never true  ? Ran Out of Food in the Last Year: Never true  ?Transportation Needs: No Transportation Needs  ? Lack of Transportation (Medical): No  ? Lack of Transportation (Non-Medical): No  ?Physical Activity: Not on file  ?Stress: Not on file  ?Social Connections: Not on file  ? ? ?Family History  ?Problem Relation Age of Onset  ? Healthy Mother   ? Healthy Father   ? ? ?Allergies  ?Allergen Reactions  ? Peanut-Containing Drug Products Anaphylaxis  ? Shellfish Allergy Anaphylaxis  ? Tomato Hives  ? Mushroom Extract Complex   ?  Childhood allergy.... Does not know reaction.   ? ? ?Medications Prior to Admission  ?Medication Sig Dispense Refill Last Dose  ? acetaminophen (TYLENOL) 325 MG tablet Take 2 tablets (650 mg total) by mouth every 4 (four) hours as needed (for pain scale < 4). (Patient taking differently: Take 325 mg by mouth every 4 (four) hours as needed for moderate pain or headache.)   01/08/2022  ? cyclobenzaprine (FLEXERIL) 10 MG tablet Take 1 tablet (10 mg total) by mouth 2 (two) times daily as needed for muscle spasms. 20 tablet 0 01/07/2022  ? furosemide (LASIX) 20 MG tablet Take 1 tablet (20 mg total) by mouth daily. 4 tablet 0 01/08/2022  ? ibuprofen (ADVIL) 600 MG tablet Take 1 tablet (600 mg total) by mouth every 6 (six) hours. (Patient not taking: Reported on 01/09/2022)  30 tablet 0 Not Taking  ? norethindrone (ORTHO MICRONOR) 0.35 MG tablet Take 1 tablet (0.35 mg total) by mouth daily. 90 tablet 3   ? ? ?Review of Systems - History obtained from the patient ? ?Vitals:  BP 134/65 (BP Location: Right Arm)   Pulse (!) 124   Temp 98.3 ?F (36.8 ?C) (Oral)   Resp (!) 30   Ht '5\' 2"'$  (1.575 m)   LMP 03/29/2021 (Exact Date)   SpO2 99%   BMI 34.93 kg/m?  ?Physical Examination: ?CONSTITUTIONAL: Well-developed, well-nourished female in no acute distress.  ?HENT:  Normocephalic, atraumatic, External right and left ear normal. Oropharynx is clear and moist ?EYES: Conjunctivae and EOM are normal.  ?NECK: Normal range of motion, supple, no masses ?SKIN: Skin is warm and dry. No rash noted. Not diaphoretic. No erythema. No pallor. ?[place]: Alert and oriented to person, place, and time. Normal reflexes, muscle tone coordination. No cranial nerve deficit noted.  1+ DTR ?PSYCHIATRIC: Normal mood and affect. Normal behavior. Normal judgment and thought content. ?CARDIOVASCULAR: Normal heart rate noted, regular rhythm ?RESPIRATORY: Effort and breath sounds normal, no problems with respiration noted ?ABDOMEN: Soft, nontender, nondistended, gravid. ?MUSCULOSKELETAL: Normal range of motion. No edema and no tenderness. 2+ distal pulses. ? ? ?Labs:  ?Results for orders placed or performed during the hospital encounter of 01/08/22 (from the past 24 hour(s))  ?Urinalysis, Routine w reflex microscopic Urine, Clean Catch  ? Collection Time: 01/08/22 11:42 PM  ?Result Value Ref Range  ? Color, Urine YELLOW YELLOW  ? APPearance HAZY (A) CLEAR  ? Specific Gravity, Urine 1.015 1.005 - 1.030  ? pH 6.0 5.0 - 8.0  ? Glucose, UA NEGATIVE NEGATIVE mg/dL  ? Hgb urine dipstick LARGE (A) NEGATIVE  ? Bilirubin Urine NEGATIVE NEGATIVE  ? Ketones, ur 20 (A) NEGATIVE mg/dL  ? Protein, ur 30 (A) NEGATIVE mg/dL  ? Nitrite NEGATIVE NEGATIVE  ? Leukocytes,Ua MODERATE (A) NEGATIVE  ? RBC / HPF >50 (H) 0 - 5 RBC/hpf  ? WBC,  UA >50 (H) 0 - 5 WBC/hpf  ? Bacteria, UA RARE (A) NONE SEEN  ? Squamous Epithelial / LPF 0-5 0 - 5  ? Mucus PRESENT   ?Resp Panel by RT-PCR (Flu A&B, Covid) Nasopharyngeal Swab  ? Collection Time: 01/08/22 11:43 PM  ? Specimen: Nasopharyngeal Swab; Nasopharyngeal(NP) swabs in vial transport medium  ?Result Value Ref Range  ? SARS Coronavirus 2 by RT PCR NEGATIVE NEGATIVE  ? Influenza A by PCR NEGATIVE NEGATIVE  ? Influenza B by PCR NEGATIVE NEGATIVE  ?CBC with Differential  ? Collection Time: 01/08/22 11:54 PM  ?Result Value Ref Range  ? WBC 10.1 4.0 -  10.5 K/uL  ? RBC 4.07 3.87 - 5.11 MIL/uL  ? Hemoglobin 10.5 (L) 12.0 - 15.0 g/dL  ? HCT 32.8 (L) 36.0 - 46.0 %  ? MCV 80.6 80.0 - 100.0 fL  ? MCH 25.8 (L) 26.0 - 34.0 pg  ? MCHC 32.0 30.0 - 36.0 g/dL  ? RDW 15.4 11.5 - 15.5 %  ? Platelets 248 150 - 400 K/uL  ? nRBC 0.0 0.0 - 0.2 %  ? Neutrophils Relative % 82 %  ? Neutro Abs 8.3 (H) 1.7 - 7.7 K/uL  ? Lymphocytes Relative 11 %  ? Lymphs Abs 1.1 0.7 - 4.0 K/uL  ? Monocytes Relative 5 %  ? Monocytes Absolute 0.5 0.1 - 1.0 K/uL  ? Eosinophils Relative 1 %  ? Eosinophils Absolute 0.1 0.0 - 0.5 K/uL  ? Basophils Relative 0 %  ? Basophils Absolute 0.0 0.0 - 0.1 K/uL  ? Immature Granulocytes 1 %  ? Abs Immature Granulocytes 0.05 0.00 - 0.07 K/uL  ?Basic metabolic panel  ? Collection Time: 01/08/22 11:54 PM  ?Result Value Ref Range  ? Sodium 142 135 - 145 mmol/L  ? Potassium 3.3 (L) 3.5 - 5.1 mmol/L  ? Chloride 112 (H) 98 - 111 mmol/L  ? CO2 18 (L) 22 - 32 mmol/L  ? Glucose, Bld 89 70 - 99 mg/dL  ? BUN 11 6 - 20 mg/dL  ? Creatinine, Ser 0.81 0.44 - 1.00 mg/dL  ? Calcium 8.3 (L) 8.9 - 10.3 mg/dL  ? GFR, Estimated >60 >60 mL/min  ? Anion gap 12 5 - 15  ?Troponin I (High Sensitivity)  ? Collection Time: 01/08/22 11:54 PM  ?Result Value Ref Range  ? Troponin I (High Sensitivity) 26 (H) <18 ng/L  ?Brain natriuretic peptide  ? Collection Time: 01/09/22 12:35 AM  ?Result Value Ref Range  ? B Natriuretic Peptide 617.9 (H) 0.0 - 100.0  pg/mL  ?Hepatic function panel  ? Collection Time: 01/09/22 12:35 AM  ?Result Value Ref Range  ? Total Protein 6.0 (L) 6.5 - 8.1 g/dL  ? Albumin 2.8 (L) 3.5 - 5.0 g/dL  ? AST 88 (H) 15 - 41 U/L  ? ALT 101 (H) 0 -

## 2022-01-09 NOTE — Consult Note (Signed)
?Cardiology Consultation:  ? ?Patient ID: Alexis Conley ?MRN: [id number]; DOB: [birth year] ? ?Admit date: 01/08/2022 ?Date of Consult: 01/09/2022 ? ?PCP:  Clarnce Flock, MD ?  ?[facility] Providers ?Cardiologist:  None      ? ? ?Patient Profile:  ? ?Alexis Conley is a 21 y.o. female with a hx of obesity and latent tuberculosis who is being seen 01/09/2022 for the evaluation of heart failure syndrome at the request of Dr. Roxanne Mins. ? ?History of Present Illness:  ? ?Alexis Conley is a 21 year old G43P1001 female now 7 days post partum from an uncomplicated delivery, discharged on furosemide, who presents for evaluation of chest pain and shortness of breath.  She reports she had been doing pretty well and that the 2+ peripheral edema with which she was discharged had been resolving but she has been slowly reaccumulating.  She developed profound exertional dyspnea and chest pressure 36 hours ago and, given progression of symptoms, presented to the emergency room.  She denies palpitations, orthopnea, or PND.  No fevers, chills, nausea, vomiting.  Blood pressure was elevated to 160/110 around the time of presentation but ahd improved to the 130s/70s after nitroglycerin paste in the ED.  On my arrival she had regular tachycardia to the 140s on the monitor and had just been administered 5 mg IV metoprolol to treat this.  Heart rates came down to the 90s.  Pulse pressure was robust throughout. ? ?She has no prior history of heart problems.  Her grandparents on her Mom's side did have cardiomyopathy though mother is unsure of the cause.  No premature ASCVD.  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  She is in lovely spirits despite everything that's been going on. ? ?She was having trouble breastfeeding and has decided to go with formula feeding.  I reiterated that if this turns out to be peripartum cardiomyopathy requiring an ARB she should not breastfeed at all  and she voiced understanding. ? ? ?Past Medical History:  ?Diagnosis Date  ? Eczema   ? Heart murmur   ? When younger  ? TB lung, latent   ? ? ?Past Surgical History:  ?Procedure Laterality Date  ? NO PAST SURGERIES    ?  ? ?Home Medications:  ?Prior to Admission medications   ?Medication Sig Start Date End Date Taking? Authorizing Provider  ?acetaminophen (TYLENOL) 325 MG tablet Take 2 tablets (650 mg total) by mouth every 4 (four) hours as needed (for pain scale < 4). 01/05/22   Janyth Pupa, DO  ?cyclobenzaprine (FLEXERIL) 10 MG tablet Take 1 tablet (10 mg total) by mouth 2 (two) times daily as needed for muscle spasms. 11/27/21   Wende Mott, CNM  ?furosemide (LASIX) 20 MG tablet Take 1 tablet (20 mg total) by mouth daily. 01/04/22   Cresenzo-Dishmon, Joaquim Lai, CNM  ?ibuprofen (ADVIL) 600 MG tablet Take 1 tablet (600 mg total) by mouth every 6 (six) hours. 01/04/22   Cresenzo-Dishmon, Joaquim Lai, CNM  ?norethindrone (ORTHO MICRONOR) 0.35 MG tablet Take 1 tablet (0.35 mg total) by mouth daily. 01/05/22 12/31/22  Janyth Pupa, DO  ?Prenatal Vit-Fe Fumarate-FA (PREPLUS) 27-1 MG TABS Take 1 tablet by mouth daily. 05/16/21   Burleson, Rona Ravens, NP  ?fluticasone (FLONASE) 50 MCG/ACT nasal spray Place 1-2 sprays into both nostrils daily. 10/05/20 02/22/21  Wieters, Hallie C, PA-C  ?norgestimate-ethinyl estradiol (SPRINTEC 28) 0.25-35 MG-MCG tablet Take 1 tablet by mouth daily. 03/31/20  08/11/20  Mesner, Corene Cornea, MD  ? ? ?Inpatient Medications: ?Scheduled Meds: ? ?Continuous Infusions: ? ?PRN Meds: ? ? ?Allergies:    ?Allergies  ?Allergen Reactions  ? Peanut-Containing Drug Products Anaphylaxis  ? Shellfish Allergy Anaphylaxis  ? Tomato Hives  ? Mushroom Extract Complex   ?  Childhood allergy.... Does not know reaction.   ? ? ?Social History:   ?Social History  ? ?Socioeconomic History  ? Marital status: Single  ?  Spouse name: Not on file  ? Number of children: Not on file  ? Years of education: Not on file  ? Highest education  level: Not on file  ?Occupational History  ? Not on file  ?Tobacco Use  ? Smoking status: Never  ? Smokeless tobacco: Never  ?Vaping Use  ? Vaping Use: Never used  ?Substance and Sexual Activity  ? Alcohol use: No  ? Drug use: No  ? Sexual activity: Yes  ?  Birth control/protection: None  ?Other Topics Concern  ? Not on file  ?Social History Narrative  ? Not on file  ? ?Social Determinants of Health  ? ?Financial Resource Strain: Not on file  ?Food Insecurity: No Food Insecurity  ? Worried About Charity fundraiser in the Last Year: Never true  ? Ran Out of Food in the Last Year: Never true  ?Transportation Needs: No Transportation Needs  ? Lack of Transportation (Medical): No  ? Lack of Transportation (Non-Medical): No  ?Physical Activity: Not on file  ?Stress: Not on file  ?Social Connections: Not on file  ?Intimate Partner Violence: Not on file  ?  ?Family History:   ?Grandparents had cardiomyopathies unclear etiology. ?Family History  ?Problem Relation Age of Onset  ? Healthy Mother   ? Healthy Father   ?  ? ?ROS:  ?Please see the history of present illness.  ?All other ROS reviewed and negative.    ? ?Physical Exam/Data:  ? ?Vitals:  ? 01/09/22 0245 01/09/22 0300 01/09/22 0315 01/09/22 0324  ?BP: (!) 154/93 (!) 133/98 128/67   ?Pulse: (!) 101 (!) 146 (!) 145 93  ?Resp: (!) 38 (!) 21 (!) 39 20  ?Temp:      ?TempSrc:      ?SpO2: 95% 93% 96% 97%  ? ?No intake or output data in the 24 hours ending 01/09/22 0348 ? ?  01/02/2022  ?  5:39 AM 12/31/2021  ?  9:41 AM 12/24/2021  ? 10:14 AM  ?Last 3 Weights  ?Weight (lbs) 191 lb 190 lb 3.2 oz 188 lb 14.4 oz  ?Weight (kg) 86.637 kg 86.274 kg 85.684 kg  ?   ?There is no height or weight on file to calculate BMI.  ?General:  Well nourished, well developed, in no acute distress ing ood spirits. ?HEENT: normal ?Neck: JVP difficult to assess with habitus ?Vascular: No carotid bruits; Distal pulses 2+ bilaterally ?Cardiac:  normal S1, S2; RRR; no murmur, tachycardic ?Lungs:  clear  to auscultation bilaterally, no wheezing, rhonchi or rales  ?Abd: soft, nontender, no hepatomegaly  ?Ext: 2+ BLE edema.  Her extremities are warm. ?Musculoskeletal:  No deformities, BUE and BLE strength normal and equal ?Skin: warm and dry  ?Neuro:  CNs 2-12 intact, no focal abnormalities noted ?Psych:  Normal affect  ? ?EKG:  The EKG was personally reviewed and demonstrates:  NSR with normal ECG ?Telemetry:  Telemetry was personally reviewed and demonstrates:  NSR and sinus tachycardia no significant arrhythmia ? ?Relevant CV Studies: ?None ? ?Laboratory Data: ? ?  High Sensitivity Troponin:   ?Recent Labs  ?Lab 01/08/22 ?2354 01/09/22 ?6333  ?TROPONINIHS 26* 30*  ?   ?Chemistry ?Recent Labs  ?Lab 01/08/22 ?2354  ?NA 142  ?K 3.3*  ?CL 112*  ?CO2 18*  ?GLUCOSE 89  ?BUN 11  ?CREATININE 0.81  ?CALCIUM 8.3*  ?GFRNONAA >60  ?ANIONGAP 12  ?  ?Recent Labs  ?Lab 01/09/22 ?5456  ?PROT 6.0*  ?ALBUMIN 2.8*  ?AST 88*  ?ALT 101*  ?ALKPHOS 156*  ?BILITOT 0.5  ? ?Lipids No results for input(s): CHOL, TRIG, HDL, LABVLDL, LDLCALC, CHOLHDL in the last 168 hours.  ?Hematology ?Recent Labs  ?Lab 01/03/22 ?1208 01/04/22 ?0421 01/08/22 ?2354  ?WBC 13.3* 13.7* 10.1  ?RBC 3.88 3.40* 4.07  ?HGB 10.2* 9.0* 10.5*  ?HCT 31.0* 27.3* 32.8*  ?MCV 79.9* 80.3 80.6  ?MCH 26.3 26.5 25.8*  ?MCHC 32.9 33.0 32.0  ?RDW 15.0 15.1 15.4  ?PLT 136* 127* 248  ? ?Thyroid No results for input(s): TSH, FREET4 in the last 168 hours.  ?BNP ?Recent Labs  ?Lab 01/09/22 ?2563  ?BNP 617.9*  ?  ?DDimer No results for input(s): DDIMER in the last 168 hours. ? ? ?Radiology/Studies:  ?CT Angio Chest PE W and/or Wo Contrast ? ?Result Date: 01/09/2022 ?CLINICAL DATA:  Five days postpartum with worsening shortness of breath. Abnormal chest x-ray. EXAM: CT ANGIOGRAPHY CHEST WITH CONTRAST TECHNIQUE: Multidetector CT imaging of the chest was performed using the standard protocol during bolus administration of intravenous contrast. Multiplanar CT image reconstructions and MIPs  were obtained to evaluate the vascular anatomy. RADIATION DOSE REDUCTION: This exam was performed according to the departmental dose-optimization program which includes automated exposure control, adjustmen

## 2022-01-09 NOTE — ED Notes (Signed)
Pt alert, NAD, calm, interactive, denies questions or needs. ?

## 2022-01-09 NOTE — Progress Notes (Signed)
Patient with suspected pre-eclampsia sent from [facility] clinic.  Discussed with OB, Dr. Elgie Congo.  He will start Mag/seizure prophylaxis, antihypertensives and transfer to [facility] specialty care with OB assuming care.  We appreciate his prompt care for patient. ? ?Hospitalists will sign off, but are glad to care for Alexis Conley again if needed. ?

## 2022-01-09 NOTE — ED Notes (Signed)
Patient transported to CT 

## 2022-01-10 ENCOUNTER — Inpatient Hospital Stay (HOSPITAL_COMMUNITY): Payer: 59

## 2022-01-10 ENCOUNTER — Inpatient Hospital Stay (HOSPITAL_COMMUNITY): Admission: AD | Admit: 2022-01-10 | Payer: 59 | Source: Home / Self Care | Admitting: Obstetrics and Gynecology

## 2022-01-10 ENCOUNTER — Other Ambulatory Visit: Payer: Self-pay | Admitting: Physician Assistant

## 2022-01-10 DIAGNOSIS — O1495 Unspecified pre-eclampsia, complicating the puerperium: Secondary | ICD-10-CM

## 2022-01-10 DIAGNOSIS — I5031 Acute diastolic (congestive) heart failure: Secondary | ICD-10-CM

## 2022-01-10 DIAGNOSIS — I309 Acute pericarditis, unspecified: Secondary | ICD-10-CM

## 2022-01-10 LAB — COMPREHENSIVE METABOLIC PANEL
ALT: 73 U/L — ABNORMAL HIGH (ref 0–44)
AST: 31 U/L (ref 15–41)
Albumin: 2.7 g/dL — ABNORMAL LOW (ref 3.5–5.0)
Alkaline Phosphatase: 158 U/L — ABNORMAL HIGH (ref 38–126)
Anion gap: 8 (ref 5–15)
BUN: <5 mg/dL — ABNORMAL LOW (ref 6–20)
CO2: 24 mmol/L (ref 22–32)
Calcium: 6.6 mg/dL — ABNORMAL LOW (ref 8.9–10.3)
Chloride: 101 mmol/L (ref 98–111)
Creatinine, Ser: 0.88 mg/dL (ref 0.44–1.00)
GFR, Estimated: >60 mL/min (ref >60)
Glucose, Bld: 97 mg/dL (ref 70–99)
Potassium: 3.2 mmol/L — ABNORMAL LOW (ref 3.5–5.1)
Sodium: 133 mmol/L — ABNORMAL LOW (ref 135–145)
Total Bilirubin: 0.4 mg/dL (ref 0.3–1.2)
Total Protein: 6 g/dL — ABNORMAL LOW (ref 6.5–8.1)

## 2022-01-10 LAB — URINE CULTURE: Culture: NO GROWTH

## 2022-01-10 MED ORDER — POTASSIUM CHLORIDE CRYS ER 20 MEQ PO TBCR
40.0000 meq | EXTENDED_RELEASE_TABLET | Freq: Two times a day (BID) | ORAL | Status: DC
  Administered 2022-01-10 – 2022-01-11 (×3): 40 meq via ORAL
  Filled 2022-01-10 (×3): qty 2

## 2022-01-10 NOTE — Progress Notes (Addendum)
Gynecology Progress Note  ?Admission Date: 01/08/2022 ?Current Date: 01/10/2022 ?9:52 AM ? ?Alexis Conley is a 21 y.o. G1P1001 HD#2 admitted for postpartum preeclampsia and  possible pericarditis.  ? ?History complicated by: ?Patient Active Problem List  ? Diagnosis Date Noted  ? Acute respiratory failure with hypoxia ([place]) 01/09/2022  ? Chest pain 01/09/2022  ? Elevated troponin 01/09/2022  ? Hypokalemia 01/09/2022  ? Hypertension 01/09/2022  ? Elevated liver enzymes 01/09/2022  ? 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 Preeclampsia in postpartum period 01/09/2022  ? Acute diastolic heart failure ([place])   ? Pericardial effusion   ? Gestational hypertension 01/03/2022  ? PROM (premature rupture of membranes) 01/02/2022  ? Alpha thalassemia silent carrier 09/04/2021  ? 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 Supervision of high risk pregnancy, antepartum 06/13/2021  ? ? ?ROS and patient/family/surgical history, located on admission H&P note dated 01/08/2022, have been reviewed, and there are no changes except as noted below ?Yesterday/Overnight Events:  ?Completed magnesium sulfate this AM, decent diuresis ? ?Subjective:  ?Pt seen this morning.  She states she feels better.  Pt does note mild headache, but this is likely from her magnesium sulfate.  Pt states she is breathing easier. ? ?Objective:  ? ?Vitals:  ? 01/10/22 0700 01/10/22 0800 01/10/22 0900 01/10/22 0941  ?BP:    133/84  ?Pulse:    (!) 109  ?Resp: 20 18 (!) 25 (!) 21  ?Temp:    98 ?F (36.7 ?C)  ?TempSrc:    Oral  ?SpO2:    99%  ?Weight:      ?Height:      ? ? ?Temp:  [97.9 ?F (36.6 ?C)-98.5 ?F (36.9 ?C)] 98 ?F (36.7 ?C) (04/20 0941) ?Pulse Rate:  [80-131] 109 (04/20 0941) ?Resp:  [17-34] 21 (04/20 0941) ?BP: (127-153)/(65-101) 133/84 (04/20 0941) ?SpO2:  [94 %-100 %] 99 % (04/20 0941) ?Weight:  [78.3 kg-78.6 kg] 78.6 kg (04/20 0500) ?I/O last 3 completed shifts: ?In: 2998.8 [P.O.:1800; I.V.:1198.8] ?Out: 4650 [YWVPX:1062] ?Total I/O ?In: 16 [P.O.:30] ?Out: 500  [Urine:500] ? ?Intake/Output Summary (Last 24 hours) at 01/10/2022 0952 ?Last data filed at 01/10/2022 0941 ?Gross per 24 hour  ?Intake 3028.78 ml  ?Output 3700 ml  ?Net -671.22 ml  ? ? ? Current Vital Signs 24h Vital Sign Ranges  ?T 98 ?F (36.7 ?C) Temp  Avg: 98.2 ?F (36.8 ?C)  Min: 97.9 ?F (36.6 ?C)  Max: 98.5 ?F (36.9 ?C)  ?BP 133/84 BP  Min: 127/65  Max: 153/101  ?HR (!) 109 ? Pulse  Avg: 112.9  Min: 80  Max: 131  ?RR (!) 21 ? Resp  Avg: 27.8  Min: 17  Max: 34  ?SaO2 99 % Room Air SpO2  Avg: 97.6 %  Min: 94 %  Max: 100 %  ?    ? 24 Hour I/O Current Shift I/O  ?Time ?Ins ?Outs 04/19 0701 - 04/20 0700 ?In: 2998.8 [P.O.:1800; I.V.:1198.8] ?Out: 6948 [Urine:3450] 04/20 0701 - 04/20 1900 ?In: 42 [P.O.:30] ?Out: 500 [Urine:500]  ? ?Patient Vitals for the past 12 hrs: ? BP Temp Temp src Pulse Resp SpO2 Weight  ?01/10/22 0941 133/84 98 ?F (36.7 ?C) Oral (!) 109 (!) 21 99 % --  ?01/10/22 0900 -- -- -- -- (!) 25 -- --  ?01/10/22 0800 -- -- -- -- 18 -- --  ?01/10/22 0700 -- -- -- -- 20 -- --  ?01/10/22 0500 -- -- -- -- -- -- 78.6 kg  ?01/10/22 0427 134/79 98.4 ?F (36.9 ?C)  Oral (!) 106 18 97 % --  ?01/10/22 0250 -- -- -- -- (!) 32 -- --  ?01/10/22 0054 127/65 98.5 ?F (36.9 ?C) Oral (!) 112 17 99 % --  ? ? ? ?Patient Vitals for the past 24 hrs: ? BP Temp Temp src Pulse Resp SpO2 Height Weight  ?01/10/22 0941 133/84 98 ?F (36.7 ?C) Oral (!) 109 (!) 21 99 % -- --  ?01/10/22 0900 -- -- -- -- (!) 25 -- -- --  ?01/10/22 0800 -- -- -- -- 18 -- -- --  ?01/10/22 0700 -- -- -- -- 20 -- -- --  ?01/10/22 0500 -- -- -- -- -- -- -- 78.6 kg  ?01/10/22 0427 134/79 98.4 ?F (36.9 ?C) Oral (!) 106 18 97 % -- --  ?01/10/22 0250 -- -- -- -- (!) 32 -- -- --  ?01/10/22 0054 127/65 98.5 ?F (36.9 ?C) Oral (!) 112 17 99 % -- --  ?01/09/22 1955 -- -- -- -- -- 98 % -- --  ?01/09/22 1950 -- -- -- -- -- 97 % -- --  ?01/09/22 1945 -- -- -- -- -- 96 % -- --  ?01/09/22 1940 -- -- -- -- -- 97 % -- --  ?01/09/22 1935 -- -- -- -- -- 96 % -- --  ?01/09/22 1920  -- -- -- -- -- 94 % -- --  ?01/09/22 1915 (!) 147/82 -- -- (!) 128 (!) 30 97 % -- --  ?01/09/22 1910 -- -- -- -- -- 96 % -- --  ?01/09/22 1905 -- -- -- -- -- 97 % -- --  ?01/09/22 1900 -- -- -- -- -- 98 % -- --  ?01/09/22 1855 -- -- -- -- -- 98 % -- --  ?01/09/22 1850 -- -- -- -- -- 99 % -- --  ?01/09/22 1845 -- -- -- -- -- 98 % -- --  ?01/09/22 1840 -- -- -- -- -- 97 % -- --  ?01/09/22 1835 -- -- -- -- -- 97 % -- --  ?01/09/22 1830 -- -- -- -- -- 96 % -- --  ?01/09/22 1825 -- -- -- -- -- 97 % -- --  ?01/09/22 1820 -- -- -- -- -- 97 % -- --  ?01/09/22 1816 131/77 -- -- (!) 122 (!) 32 97 % -- --  ?01/09/22 1815 -- -- -- -- -- 95 % -- --  ?01/09/22 1805 -- -- -- -- -- 98 % -- --  ?01/09/22 1800 -- -- -- -- -- 97 % -- --  ?01/09/22 1714 139/89 98.3 ?F (36.8 ?C) Oral (!) 124 (!) 28 99 % -- --  ?01/09/22 1616 137/79 -- -- (!) 125 (!) 32 99 % -- --  ?01/09/22 1515 128/70 98.5 ?F (36.9 ?C) Oral (!) 121 (!) 31 96 % -- --  ?01/09/22 1417 136/80 -- -- (!) 118 (!) 34 96 % -- --  ?01/09/22 1317 138/75 -- -- (!) 131 (!) 32 98 % -- --  ?01/09/22 1215 134/65 98.3 ?F (36.8 ?C) Oral (!) 124 (!) 30 99 % -- --  ?01/09/22 1114 136/86 -- -- (!) 110 (!) 32 100 % -- --  ?01/09/22 1105 135/87 -- -- (!) 108 (!) 32 100 % -- --  ?01/09/22 1055 (!) 138/91 97.9 ?F (36.6 ?C) Oral 89 (!) 30 100 % -- --  ?01/09/22 1038 138/81 97.9 ?F (36.6 ?C) Oral 99 (!) 28 100 % '5\' 2"'$  (1.575 m) 78.3 kg  ?01/09/22 1000 (!) 153/101 -- -- 80 (!) 34 100 % -- --  ? ? ?  Physical exam: ?General appearance: alert, cooperative, appears stated age, and no distress ?Abdomen: soft, non-tender; bowel sounds normal; no masses,  no organomegaly ?GU: No gross VB ?Lungs: clear to auscultation bilaterally ?Heart: regular rhythm but mild tachycardia noted ?Extremities: no lower extremity edema ?Skin: WNL ?Psych: appropriate ?Neurologic: Grossly normal ? ?Medications ?Current Facility-Administered Medications  ?Medication Dose Route Frequency Provider Last Rate Last Admin  ?  enoxaparin (LOVENOX) injection 40 mg  40 mg Subcutaneous Q24H Shela Leff, MD   40 mg at 01/10/22 0940  ? furosemide (LASIX) tablet 20 mg  20 mg Oral BID Griffin Basil, MD   20 mg at 01/10/22 1740  ? labetalol (NORMODYNE) injection 20 mg  20 mg Intravenous PRN Griffin Basil, MD      ? And  ? labetalol (NORMODYNE) injection 40 mg  40 mg Intravenous PRN Griffin Basil, MD      ? And  ? labetalol (NORMODYNE) injection 80 mg  80 mg Intravenous PRN Griffin Basil, MD      ? And  ? hydrALAZINE (APRESOLINE) injection 10 mg  10 mg Intravenous PRN Griffin Basil, MD      ? lactated ringers infusion   Intravenous Continuous Griffin Basil, MD   Stopped at 01/10/22 [phone]  ? NIFEdipine (PROCARDIA-XL/NIFEDICAL-XL) 24 hr tablet 30 mg  30 mg Oral Daily Griffin Basil, MD   30 mg at 01/10/22 0940  ? ? ? ? ?Labs  ?Recent Labs  ?Lab 01/03/22 ?1208 01/04/22 ?0421 01/08/22 ?2354  ?WBC 13.3* 13.7* 10.1  ?HGB 10.2* 9.0* 10.5*  ?HCT 31.0* 27.3* 32.8*  ?PLT 136* 127* 248  ? ? ?Recent Labs  ?Lab 01/08/22 ?2354 01/09/22 ?8185 01/09/22 ?6314 01/10/22 ?0505  ?NA 142  --  140 133*  ?K 3.3*  --  3.6 3.2*  ?CL 112*  --  109 101  ?CO2 18*  --  22 24  ?BUN 11  --  8 <5*  ?CREATININE 0.81  --  0.89 0.88  ?CALCIUM 8.3*  --  8.2* 6.6*  ?PROT  --  6.0*  --  6.0*  ?BILITOT  --  0.5  --  0.4  ?ALKPHOS  --  156*  --  158*  ?ALT  --  101*  --  73*  ?AST  --  88*  --  31  ?GLUCOSE 89  --  108* 97  ? ? ?Radiology ?No new studies ? ?Assessment & Plan:  ?Postpartum preeclampsia, mild pericarditis. ?Appreciate Cardiology assistance, awaiting recommendations for outpatient treatment of pericarditis ?Discontinue magnesium sulfate, pt has completed 24 hours. ?Liver functions are improving. ?Mild hypokalemia noted, will replace with po k dur ?Continue po procardia, continue lasix 20 mg po BID x total of 5 days, today is day 2 ?Anticipate discharge on 4/21 ? ?Code Status: Full Code ? ?Total time taking care of the patient was 20 minutes, with  greater than 50% of the time spent in face to face interaction with the patient. ? ?Lynnda Shields, MD ?Attending ?[facility] [employer] [facility]) ?   ?

## 2022-01-10 NOTE — Progress Notes (Signed)
? ?Progress Note ? ?Patient Name: Alexis Conley ?Date of Encounter: 01/10/2022 ? ?[place] Cardiologist: None New (Derin Granquist) ? ?Subjective  ? ?Feeling much better.  No orthopnea.  No dyspnea walking in room.  Has minimal pleuritic discomfort only with very deep breaths much less intense than on admission.  No longer coughing. ?ESR and CRP are quite high (even when taking into account postpartum state), consistent with an inflammatory condition. ? ?Inpatient Medications  ?  ?Scheduled Meds: ? enoxaparin (LOVENOX) injection  40 mg Subcutaneous Q24H  ? furosemide  20 mg Oral BID  ? NIFEdipine  30 mg Oral Daily  ? potassium chloride  40 mEq Oral BID  ? ?Continuous Infusions: ? lactated ringers Stopped (01/10/22 0948)  ? ?PRN Meds: ?labetalol **AND** labetalol **AND** labetalol **AND** hydrALAZINE **AND** Measure blood pressure  ? ?Vital Signs  ?  ?Vitals:  ? 01/10/22 0700 01/10/22 0800 01/10/22 0900 01/10/22 0941  ?BP:    133/84  ?Pulse:    (!) 109  ?Resp: 20 18 (!) 25 (!) 21  ?Temp:    98 ?F (36.7 ?C)  ?TempSrc:    Oral  ?SpO2:    99%  ?Weight:      ?Height:      ? ? ?Intake/Output Summary (Last 24 hours) at 01/10/2022 1158 ?Last data filed at 01/10/2022 1125 ?Gross per 24 hour  ?Intake 3114.59 ml  ?Output 2200 ml  ?Net 914.59 ml  ? ? ?  01/10/2022  ?  5:00 AM 01/09/2022  ? 10:38 AM 01/02/2022  ?  5:39 AM  ?Last 3 Weights  ?Weight (lbs) 173 lb 4 oz 172 lb 9.6 oz 191 lb  ?Weight (kg) 78.586 kg 78.291 kg 86.637 kg  ?   ? ?Telemetry  ?  ?Normal sinus rhythm- Personally Reviewed ? ?ECG  ?  ?No new tracing- Personally Reviewed ? ?Physical Exam  ?Appears well ?GEN: No acute distress.   ?Neck: No JVD ?Cardiac: RRR, no murmurs, rubs, or gallops.  ?Respiratory: Clear to auscultation bilaterally. ?GI: Soft, nontender, non-distended  ?MS: No edema; No deformity. ?Neuro:  Nonfocal  ?Psych: Normal affect  ? ?Labs  ?  ?High Sensitivity Troponin:   ?Recent Labs  ?Lab 01/08/22 ?2354 01/09/22 ?1610  ?TROPONINIHS 26* 30*  ?    ?Chemistry ?Recent Labs  ?Lab 01/08/22 ?2354 01/09/22 ?9604 01/09/22 ?5409 01/10/22 ?0505  ?NA 142  --  140 133*  ?K 3.3*  --  3.6 3.2*  ?CL 112*  --  109 101  ?CO2 18*  --  22 24  ?GLUCOSE 89  --  108* 97  ?BUN 11  --  8 <5*  ?CREATININE 0.81  --  0.89 0.88  ?CALCIUM 8.3*  --  8.2* 6.6*  ?MG  --   --  2.0  --   ?PROT  --  6.0*  --  6.0*  ?ALBUMIN  --  2.8*  --  2.7*  ?AST  --  88*  --  31  ?ALT  --  101*  --  73*  ?ALKPHOS  --  156*  --  158*  ?BILITOT  --  0.5  --  0.4  ?GFRNONAA >60  --  >60 >60  ?ANIONGAP 12  --  9 8  ?  ?Lipids No results for input(s): CHOL, TRIG, HDL, LABVLDL, LDLCALC, CHOLHDL in the last 168 hours.  ?Hematology ?Recent Labs  ?Lab 01/03/22 ?1208 01/04/22 ?0421 01/08/22 ?2354  ?WBC 13.3* 13.7* 10.1  ?RBC 3.88 3.40* 4.07  ?HGB 10.2*  9.0* 10.5*  ?HCT 31.0* 27.3* 32.8*  ?MCV 79.9* 80.3 80.6  ?MCH 26.3 26.5 25.8*  ?MCHC 32.9 33.0 32.0  ?RDW 15.0 15.1 15.4  ?PLT 136* 127* 248  ? ?Thyroid No results for input(s): TSH, FREET4 in the last 168 hours.  ?BNP ?Recent Labs  ?Lab 01/09/22 ?6269  ?BNP 617.9*  ?  ?DDimer No results for input(s): DDIMER in the last 168 hours.  ? ?Radiology  ?  ?CT Angio Chest PE W and/or Wo Contrast ? ?Result Date: 01/09/2022 ?CLINICAL DATA:  Five days postpartum with worsening shortness of breath. Abnormal chest x-ray. EXAM: CT ANGIOGRAPHY CHEST WITH CONTRAST TECHNIQUE: Multidetector CT imaging of the chest was performed using the standard protocol during bolus administration of intravenous contrast. Multiplanar CT image reconstructions and MIPs were obtained to evaluate the vascular anatomy. RADIATION DOSE REDUCTION: This exam was performed according to the departmental dose-optimization program which includes automated exposure control, adjustment of the mA and/or kV according to patient size and/or use of iterative reconstruction technique. CONTRAST:  80m OMNIPAQUE IOHEXOL 350 MG/ML SOLN COMPARISON:  Portable chest yesterday, PA chest 05/02/2020. FINDINGS: Cardiovascular:  The heart is slightly enlarged. There is no pericardial effusion. The aorta and great vessels are unremarkable. The pulmonary veins are decompressed. Pulmonary arteries are normal in caliber without appreciable thromboemboli. Mediastinum/Nodes: There is a mildly prominent left-sided prevascular lymph node up to 1.1 cm in short axis. There is ill-defined soft tissue eccentric to the right in the anterior mediastinal prevascular space, on 5:49 measuring 6 x 2.3 cm and extending over several slices, approximately 2.7 cm craniocaudal. This could be additional mediastinal adenopathy or atypical appearance of residual or rebounded thymus given the patient's young age. There is no hilar or axillary adenopathy. The lower poles of the thyroid are unremarkable. The trachea is clear. There is a 7 mm right posterolateral tracheal diverticulum at the level of T1-2. There is no significant thickening of the esophagus. Lungs/Pleura: There are small layering pleural effusions, slightly greater fluid on the right. There is no pleural thickening or pneumothorax. There is subpleural interstitial edema in the lung fields extending inferiorly from the aortic arch level, more pronounced in the bases. There are widespread dense patchy alveolar opacities of the lung fields, predominating in the upper lobes with a mildly less dense appearance in the lower and right middle lobes. Portions of the peripheral lungs are relatively but not completely spared from the process. The central airways are patent. Upper Abdomen: No acute abnormality. Musculoskeletal: No chest wall abnormality, no acute or significant osseous findings. Review of the MIP images confirms the above findings. IMPRESSION: 1. Slight cardiomegaly without significant venous distention. No pulmonary arterial dilatation or visible embolus. 2. 6.0 x 2.3 x 2.7 cm anterior mediastinal mass versus atypical residual or rebound thymus. Further evaluation recommended. PET-CT may be  helpful. 3. Generalized interstitial edema with a basal gradient with diffuse upper-zone-predominant patchy to confluent dense alveolar consolidations. This could all be due to interstitial and alveolar pulmonary edema whether noncardiogenic or due to ARDS, or could be due to a combination of pulmonary edema and pneumonia. Pulmonary hemorrhage and bronchoalveolar carcinoma could produce similar opacities but would not typically be this extensive. Clinical correlation and short interval follow-up CT recommended. 4. There are small layering pleural effusions right-greater-than-left. Electronically Signed   By: KTelford NabM.D.   On: 01/09/2022 02:52  ? ?DG Chest Port 1 View ? ?Result Date: 01/09/2022 ?CLINICAL DATA:  Five days postpartum, presents with shortness of breath.  EXAM: PORTABLE CHEST 1 VIEW COMPARISON:  PA single view 05/02/2020. FINDINGS: The heart silhouette has become mildly enlarged but the central vessels appear to be normal in caliber. There are interstitial and patchy opacities of the lung fields from the level of the aortic arch down, left-greater-than-right, small left and trace right pleural effusions. Lung apices are relatively clear. The lung opacities combined with interstitial consolidation could be due to pneumonia, edema or combination. The mediastinum is normally outlined.  There is no aortic ectasia. Regional skeletal structures unremarkable apart from slight thoracic levoscoliosis. IMPRESSION: 1. Mild cardiomegaly, with normal caliber central vasculature. 2. Interstitial and patchy opacities of the left-greater-than-right lung fields consistent with pneumonia, edema or combination, with small left and trace right pleural effusions. 3. Clinical correlation and radiographic follow-up recommended. Electronically Signed   By: Telford Nab M.D.   On: 01/09/2022 00:20  ? ?ECHOCARDIOGRAM COMPLETE ? ?Result Date: 01/09/2022 ?   ECHOCARDIOGRAM REPORT   Patient Name:   Alexis Conley Date of  Exam: 01/09/2022 Medical Rec #:  [id number]       Height:       62.0 in Accession #:    [id number]      Weight:       191.0 lb Date of Birth:  02-Jan-2001       BSA:          1.875 m? Patient Age:    21 years        BP:

## 2022-01-11 ENCOUNTER — Other Ambulatory Visit (HOSPITAL_COMMUNITY): Payer: Self-pay

## 2022-01-11 DIAGNOSIS — O903 Peripartum cardiomyopathy: Secondary | ICD-10-CM

## 2022-01-11 LAB — COMPREHENSIVE METABOLIC PANEL
ALT: 51 U/L — ABNORMAL HIGH (ref 0–44)
AST: 17 U/L (ref 15–41)
Albumin: 2.6 g/dL — ABNORMAL LOW (ref 3.5–5.0)
Alkaline Phosphatase: 136 U/L — ABNORMAL HIGH (ref 38–126)
Anion gap: 6 (ref 5–15)
BUN: 8 mg/dL (ref 6–20)
CO2: 26 mmol/L (ref 22–32)
Calcium: 7.8 mg/dL — ABNORMAL LOW (ref 8.9–10.3)
Chloride: 109 mmol/L (ref 98–111)
Creatinine, Ser: 0.81 mg/dL (ref 0.44–1.00)
GFR, Estimated: >60 mL/min (ref >60)
Glucose, Bld: 88 mg/dL (ref 70–99)
Potassium: 4 mmol/L (ref 3.5–5.1)
Sodium: 141 mmol/L (ref 135–145)
Total Bilirubin: 0.3 mg/dL (ref 0.3–1.2)
Total Protein: 5.9 g/dL — ABNORMAL LOW (ref 6.5–8.1)

## 2022-01-11 MED ORDER — FUROSEMIDE 20 MG PO TABS
20.0000 mg | ORAL_TABLET | Freq: Two times a day (BID) | ORAL | 0 refills | Status: DC
  Filled 2022-01-11: qty 8, 4d supply, fill #0

## 2022-01-11 MED ORDER — NIFEDIPINE ER 30 MG PO TB24
30.0000 mg | ORAL_TABLET | Freq: Every day | ORAL | 0 refills | Status: DC
  Filled 2022-01-11: qty 30, 30d supply, fill #0

## 2022-01-11 NOTE — Plan of Care (Signed)
?  Problem: Education: ?Goal: Ability to demonstrate management of disease process will improve ?Outcome: Adequate for Discharge ?Goal: Ability to verbalize understanding of medication therapies will improve ?Outcome: Adequate for Discharge ?Goal: Individualized Educational Video(s) ?Outcome: Adequate for Discharge ?  ?Problem: Activity: ?Goal: Capacity to carry out activities will improve ?Outcome: Adequate for Discharge ?  ?Problem: Cardiac: ?Goal: Ability to achieve and maintain adequate cardiopulmonary perfusion will improve ?Outcome: Adequate for Discharge ?  ?Problem: Education: ?Goal: Knowledge of General Education information will improve ?Description: Including pain rating scale, medication(s)/side effects and non-pharmacologic comfort measures ?Outcome: Adequate for Discharge ?  ?Problem: Health Behavior/Discharge Planning: ?Goal: Ability to manage health-related needs will improve ?Outcome: Adequate for Discharge ?  ?Problem: Clinical Measurements: ?Goal: Ability to maintain clinical measurements within normal limits will improve ?Outcome: Adequate for Discharge ?Goal: Will remain free from infection ?Outcome: Adequate for Discharge ?Goal: Diagnostic test results will improve ?Outcome: Adequate for Discharge ?Goal: Respiratory complications will improve ?Outcome: Adequate for Discharge ?Goal: Cardiovascular complication will be avoided ?Outcome: Adequate for Discharge ?  ?Problem: Activity: ?Goal: Risk for activity intolerance will decrease ?Outcome: Adequate for Discharge ?  ?Problem: Nutrition: ?Goal: Adequate nutrition will be maintained ?Outcome: Adequate for Discharge ?  ?Problem: Coping: ?Goal: Level of anxiety will decrease ?Outcome: Adequate for Discharge ?  ?Problem: Elimination: ?Goal: Will not experience complications related to bowel motility ?Outcome: Adequate for Discharge ?Goal: Will not experience complications related to urinary retention ?Outcome: Adequate for Discharge ?  ?Problem: Pain  Managment: ?Goal: General experience of comfort will improve ?Outcome: Adequate for Discharge ?  ?Problem: Safety: ?Goal: Ability to remain free from injury will improve ?Outcome: Adequate for Discharge ?  ?Problem: Skin Integrity: ?Goal: Risk for impaired skin integrity will decrease ?Outcome: Adequate for Discharge ?  ?Problem: Education: ?Goal: Knowledge of disease or condition will improve ?Outcome: Adequate for Discharge ?Goal: Knowledge of the prescribed therapeutic regimen will improve ?Outcome: Adequate for Discharge ?  ?Problem: Fluid Volume: ?Goal: Peripheral tissue perfusion will improve ?Outcome: Adequate for Discharge ?  ?Problem: Clinical Measurements: ?Goal: Complications related to disease process, condition or treatment will be avoided or minimized ?Outcome: Adequate for Discharge ?  ?

## 2022-01-11 NOTE — Discharge Summary (Signed)
Physician Discharge Summary  ?Patient ID: ?Alexis Conley ?MRN: [id number] ?DOB/AGE: 10-22-2000 21 y.o. ? ?Admit date: 01/08/2022 ?Discharge date: 01/11/2022 ? ?Admission Diagnoses: ? ?Discharge Diagnoses:  ?Principal Problem: ?  Acute respiratory failure with hypoxia ([place]) ?Active Problems: ?  Chest pain ?  Elevated troponin ?  Hypokalemia ?  Hypertension ?  Elevated liver enzymes ?  Mediastinal mass ?  Preeclampsia in postpartum period ?  Acute pericarditis ? ? ?Discharged Condition: stable ? ?Hospital Course: 21yo G1P1 admitted due to postpartum preeclampsia and acute pericarditis.  Pt treated with IV Magnesium x24hr and anti-hypertensives to manage her preeclampsia.  Due to the chest pain/SOB- cardio work up was completed that show acute pericarditis.  Cardiology consulted and recommendations given- suspect resolution and advised follow up in one month.  Pt was discharged home on HD#2 in stable condition with plans for outpatient follow up   ? ?Consults: cardiology ? ?Significant Diagnostic Studies: labs:  ?Results for orders placed or performed during the hospital encounter of 01/08/22 (from the past 24 hour(s))  ?Comprehensive metabolic panel     Status: Abnormal  ? Collection Time: 01/11/22  4:19 AM  ?Result Value Ref Range  ? Sodium 141 135 - 145 mmol/L  ? Potassium 4.0 3.5 - 5.1 mmol/L  ? Chloride 109 98 - 111 mmol/L  ? CO2 26 22 - 32 mmol/L  ? Glucose, Bld 88 70 - 99 mg/dL  ? BUN 8 6 - 20 mg/dL  ? Creatinine, Ser 0.81 0.44 - 1.00 mg/dL  ? Calcium 7.8 (L) 8.9 - 10.3 mg/dL  ? Total Protein 5.9 (L) 6.5 - 8.1 g/dL  ? Albumin 2.6 (L) 3.5 - 5.0 g/dL  ? AST 17 15 - 41 U/L  ? ALT 51 (H) 0 - 44 U/L  ? Alkaline Phosphatase 136 (H) 38 - 126 U/L  ? Total Bilirubin 0.3 0.3 - 1.2 mg/dL  ? GFR, Estimated >60 >60 mL/min  ? Anion gap 6 5 - 15  ? ?ECHO completed 4/19 ? ?Treatments: IV hydration, IV Lasix and procardia ? ?Discharge Exam: ?Blood pressure 126/71, pulse 90, temperature 97.8 ?F (36.6 ?C), temperature source Oral,  resp. rate 18, height '5\' 2"'$  (1.575 m), weight 77.3 kg, last menstrual period 03/29/2021, SpO2 100 %, unknown if currently breastfeeding. ?General appearance: alert, cooperative, and no distress ?Resp: clear to auscultation bilaterally ?Cardio: regular rate and rhythm ?GI: soft and non-tender, no rebound, no guarding ?Extremities: no edema, no calf tenderness bilaterally ?Skin: warm and dry ? ?Disposition: Discharge disposition: 01-Home or Self Care ? ? ? ? ? ? ? ?Allergies as of 01/11/2022   ? ?   Reactions  ? Peanut-containing Drug Products Anaphylaxis  ? Shellfish Allergy Anaphylaxis  ? Tomato Hives  ? Mushroom Extract Complex   ? Childhood allergy.... Does not know reaction.   ? ?  ? ?  ?Medication List  ?  ? ?TAKE these medications   ? ?acetaminophen 325 MG tablet ?Commonly known as: Tylenol ?Take 2 tablets (650 mg total) by mouth every 4 (four) hours as needed (for pain scale < 4). ?What changed:  ?how much to take ?reasons to take this ?  ?cyclobenzaprine 10 MG tablet ?Commonly known as: FLEXERIL ?Take 1 tablet (10 mg total) by mouth 2 (two) times daily as needed for muscle spasms. ?  ?furosemide 20 MG tablet ?Commonly known as: LASIX ?Take 1 tablet (20 mg total) by mouth 2 (two) times daily for 4 days. ?What changed: when to take this ?  ?  ibuprofen 600 MG tablet ?Commonly known as: ADVIL ?Take 1 tablet (600 mg total) by mouth every 6 (six) hours. ?  ?NIFEdipine 30 MG 24 hr tablet ?Commonly known as: ADALAT CC ?Take 1 tablet (30 mg total) by mouth daily. ?  ?norethindrone 0.35 MG tablet ?Commonly known as: Ortho Micronor ?Take 1 tablet (0.35 mg total) by mouth daily. ?  ? ?  ? ? Follow-up Information   ? ? [facility] [employer] [facility]. Schedule an appointment as soon as possible for a visit in 1 week(s).   ?Specialty: Obstetrics and Gynecology ?Why: Follow up ?Contact information: ?[address] ?[place] [address] ?[phone] ? ?  ?  ? ?  ?  ? ?   ? ? ?Signed: ?Annalee Genta ?01/11/2022, 6:38 AM ? ? ?

## 2022-01-14 ENCOUNTER — Telehealth (HOSPITAL_COMMUNITY): Payer: Self-pay | Admitting: *Deleted

## 2022-01-14 LAB — CULTURE, BLOOD (ROUTINE X 2)
Culture: NO GROWTH
Culture: NO GROWTH
Special Requests: ADEQUATE
Special Requests: ADEQUATE

## 2022-01-14 NOTE — Telephone Encounter (Signed)
Mom reports feeling good. No concerns about herself at this time. EPDS=0(Hospital score=0) ?Mom reports baby is doing well. Feeding, peeing, and pooping without difficulty. Safe sleep reviewed. Mom reports no concerns about baby at present. ? ?Odis Hollingshead, RN 01-14-2022 at 10:18am ?

## 2022-01-31 ENCOUNTER — Ambulatory Visit (HOSPITAL_COMMUNITY): Payer: 59

## 2022-02-02 ENCOUNTER — Inpatient Hospital Stay (HOSPITAL_COMMUNITY)
Admission: AD | Admit: 2022-02-02 | Discharge: 2022-02-02 | Disposition: A | Discharge status: Home / Self Care | Payer: 59 | Attending: Obstetrics & Gynecology | Admitting: Obstetrics & Gynecology

## 2022-02-02 ENCOUNTER — Encounter (HOSPITAL_COMMUNITY): Payer: Self-pay | Admitting: Obstetrics & Gynecology

## 2022-02-02 DIAGNOSIS — N950 Postmenopausal bleeding: Secondary | ICD-10-CM | POA: Insufficient documentation

## 2022-02-02 DIAGNOSIS — N926 Irregular menstruation, unspecified: Secondary | ICD-10-CM

## 2022-02-02 DIAGNOSIS — O9089 Other complications of the puerperium, not elsewhere classified: Secondary | ICD-10-CM | POA: Diagnosis present

## 2022-02-02 LAB — CBC WITH DIFFERENTIAL/PLATELET
Abs Immature Granulocytes: 0.01 10*3/uL (ref 0.00–0.07)
Basophils Absolute: 0 10*3/uL (ref 0.0–0.1)
Basophils Relative: 1 %
Eosinophils Absolute: 0.1 10*3/uL (ref 0.0–0.5)
Eosinophils Relative: 3 %
HCT: 34.2 % — ABNORMAL LOW (ref 36.0–46.0)
Hemoglobin: 10.7 g/dL — ABNORMAL LOW (ref 12.0–15.0)
Immature Granulocytes: 0 %
Lymphocytes Relative: 26 %
Lymphs Abs: 1.2 10*3/uL (ref 0.7–4.0)
MCH: 25.5 pg — ABNORMAL LOW (ref 26.0–34.0)
MCHC: 31.3 g/dL (ref 30.0–36.0)
MCV: 81.6 fL (ref 80.0–100.0)
Monocytes Absolute: 0.5 10*3/uL (ref 0.1–1.0)
Monocytes Relative: 11 %
Neutro Abs: 2.7 10*3/uL (ref 1.7–7.7)
Neutrophils Relative %: 59 %
Platelets: 286 10*3/uL (ref 150–400)
RBC: 4.19 MIL/uL (ref 3.87–5.11)
RDW: 16.2 % — ABNORMAL HIGH (ref 11.5–15.5)
WBC: 4.6 10*3/uL (ref 4.0–10.5)
nRBC: 0 % (ref 0.0–0.2)

## 2022-02-02 NOTE — MAU Provider Note (Signed)
?History  ?  ? ?CSN: [id number] ? ?Arrival date and time: 02/02/22 1402 ? ? Event Date/Time  ? First Provider Initiated Contact with Patient 02/02/22 1432   ?  ? ?Chief Complaint  ?Patient presents with  ? Vaginal Bleeding  ? ?HPI ?Alexis Conley is a 21 y.o. G1P1001 postpartum from a vaginal delivery on 4/13 who presents with vaginal bleeding. She reports her bleeding had stopped but then suddenly started back up yesterday. She reports she has been more active and exercising more this week. She is not breastfeeding. She denies any lightheadedness or dizziness. ? ?OB History   ? ? Gravida  ?1  ? Para  ?1  ? Term  ?1  ? Preterm  ?   ? AB  ?   ? Living  ?1  ?  ? ? SAB  ?   ? IAB  ?   ? Ectopic  ?   ? Multiple  ?0  ? Live Births  ?1  ?   ?  ?  ? ? ?Past Medical History:  ?Diagnosis Date  ? Eczema   ? Heart murmur   ? When younger  ? TB lung, latent   ? ? ?Past Surgical History:  ?Procedure Laterality Date  ? NO PAST SURGERIES    ? ? ?Family History  ?Problem Relation Age of Onset  ? Healthy Mother   ? Healthy Father   ? ? ?Social History  ? ?Tobacco Use  ? Smoking status: Never  ? Smokeless tobacco: Never  ?Vaping Use  ? Vaping Use: Never used  ?Substance Use Topics  ? Alcohol use: No  ? Drug use: No  ? ? ?Allergies:  ?Allergies  ?Allergen Reactions  ? Peanut-Containing Drug Products Anaphylaxis  ? Shellfish Allergy Anaphylaxis  ? Tomato Hives  ? Mushroom Extract Complex   ?  Childhood allergy.... Does not know reaction.   ? ? ?Medications Prior to Admission  ?Medication Sig Dispense Refill Last Dose  ? NIFEdipine (ADALAT CC) 30 MG 24 hr tablet Take 1 tablet (30 mg total) by mouth daily. 30 tablet 0 02/02/2022  ? acetaminophen (TYLENOL) 325 MG tablet Take 2 tablets (650 mg total) by mouth every 4 (four) hours as needed (for pain scale < 4). (Patient taking differently: Take 325 mg by mouth every 4 (four) hours as needed for moderate pain or headache.)     ? cyclobenzaprine (FLEXERIL) 10 MG tablet Take 1 tablet (10 mg  total) by mouth 2 (two) times daily as needed for muscle spasms. 20 tablet 0   ? furosemide (LASIX) 20 MG tablet Take 1 tablet (20 mg total) by mouth 2 (two) times daily for 4 days. 8 tablet 0   ? ibuprofen (ADVIL) 600 MG tablet Take 1 tablet (600 mg total) by mouth every 6 (six) hours. (Patient not taking: Reported on 01/09/2022) 30 tablet 0   ? norethindrone (ORTHO MICRONOR) 0.35 MG tablet Take 1 tablet (0.35 mg total) by mouth daily. 90 tablet 3   ? ? ?Review of Systems  ?Constitutional: Negative.  Negative for fatigue and fever.  ?HENT: Negative.    ?Respiratory: Negative.  Negative for shortness of breath.   ?Cardiovascular: Negative.  Negative for chest pain.  ?Gastrointestinal: Negative.  Negative for abdominal pain, constipation, diarrhea, nausea and vomiting.  ?Genitourinary:  Positive for vaginal bleeding. Negative for dysuria and vaginal discharge.  ?Neurological: Negative.  Negative for dizziness and headaches.  ?Physical Exam  ? ?Blood pressure 122/71, pulse 90, temperature 98.4 ?  F (36.9 ?C), temperature source Oral, resp. rate 20, height '5\' 2"'$  (1.575 m), weight 73.9 kg, SpO2 100 %, not currently breastfeeding. ? ?Physical Exam ?Vitals and nursing note reviewed.  ?Constitutional:   ?   General: She is not in acute distress. ?   Appearance: She is well-developed.  ?HENT:  ?   Head: Normocephalic.  ?Eyes:  ?   Pupils: Pupils are equal, round, and reactive to light.  ?Cardiovascular:  ?   Rate and Rhythm: Normal rate and regular rhythm.  ?   Heart sounds: Normal heart sounds.  ?Pulmonary:  ?   Effort: Pulmonary effort is normal. No respiratory distress.  ?   Breath sounds: Normal breath sounds.  ?Abdominal:  ?   General: Bowel sounds are normal. There is no distension.  ?   Palpations: Abdomen is soft.  ?   Tenderness: There is no abdominal tenderness.  ?Genitourinary: ?   Comments: SSE: small amount of dark red blood in vault ?Skin: ?   General: Skin is warm and dry.  ?Neurological:  ?   Mental Status: She  is alert and oriented to person, place, and time.  ?Psychiatric:     ?   Mood and Affect: Mood normal.     ?   Behavior: Behavior normal.     ?   Thought Content: Thought content normal.     ?   Judgment: Judgment normal.  ? ? ?MAU Course  ?Procedures ?Results for orders placed or performed during the hospital encounter of 02/02/22 (from the past 24 hour(s))  ?CBC with Differential/Platelet     Status: Abnormal  ? Collection Time: 02/02/22  2:48 PM  ?Result Value Ref Range  ? WBC 4.6 4.0 - 10.5 K/uL  ? RBC 4.19 3.87 - 5.11 MIL/uL  ? Hemoglobin 10.7 (L) 12.0 - 15.0 g/dL  ? HCT 34.2 (L) 36.0 - 46.0 %  ? MCV 81.6 80.0 - 100.0 fL  ? MCH 25.5 (L) 26.0 - 34.0 pg  ? MCHC 31.3 30.0 - 36.0 g/dL  ? RDW 16.2 (H) 11.5 - 15.5 %  ? Platelets 286 150 - 400 K/uL  ? nRBC 0.0 0.0 - 0.2 %  ? Neutrophils Relative % 59 %  ? Neutro Abs 2.7 1.7 - 7.7 K/uL  ? Lymphocytes Relative 26 %  ? Lymphs Abs 1.2 0.7 - 4.0 K/uL  ? Monocytes Relative 11 %  ? Monocytes Absolute 0.5 0.1 - 1.0 K/uL  ? Eosinophils Relative 3 %  ? Eosinophils Absolute 0.1 0.0 - 0.5 K/uL  ? Basophils Relative 1 %  ? Basophils Absolute 0.0 0.0 - 0.1 K/uL  ? Immature Granulocytes 0 %  ? Abs Immature Granulocytes 0.01 0.00 - 0.07 K/uL  ?  ?MDM ?CBC with Diff ?Reassurance provided of normalcy of exam and discussed this could be her period returning vs the impact of more exercise. Warning signs of when to return reviewed at length ? ?Assessment and Plan  ? ?1. Postpartum state   ?2. Menstrual problem   ? ?-Discharge home in stable condition ?-Vaginal bleeding precautions discussed ?-Patient advised to follow-up with OB as scheduled on 5/16 ?-Patient may return to MAU as needed or if her condition were to change or worsen ? ? ?Wende Mott CNM ?02/02/2022, 2:32 PM  ?

## 2022-02-02 NOTE — MAU Note (Signed)
.  Alexis Conley is a 21 y.o. one month postpartum vaginal delivery here in MAU reporting: stopped bleeding on 5/7 and then began bleeding heavily on 5/10. She states she was wearing pads but it was flowing over so now she is just changing overnight underwear every hour. She has also passed "ping-pong ball" sized clots. Having intermittent lower abdominal cramping.  ? ?Onset of complaint: 5/10 ?Pain score: 6 ?  ?

## 2022-02-03 NOTE — Progress Notes (Deleted)
Cardiology Office Note:    Date:  02/03/2022   ID:  Judyann Munson, DOB [birth year], MRN [id number]  PCP:  Clarnce Flock, MD [place] Cardiologist: None ***  Reason for visit: ***  History of Present Illness:    WRENLY LAURITSEN is a 21 y.o. female  G1P1 (delivery 01/03/2022) admitted 4/18-4/21/23 due to postpartum preeclampsia and acute pericarditis.  Pt treated with IV Magnesium x24hr and anti-hypertensives to manage her preeclampsia.  Due to the chest pain/SOB- cardio work up was completed that show acute pericarditis with elevated inflammatory markers.  She responded well with loop diuretics and nifedipine.  Her symptoms of pericarditis were very mild and improving therefore NSAIDs were deferred.  NSAIDs would cause further HTN and blunt the effects of diuretics.  Echo showed normal right and left ventricular systolic function.  Small to moderate pericardial effusion.  No LVH or valve disease.  No tamponade.  Today, ***  Acute pericarditis -Following vaginal delivery, hypertensive urgency/delayed preeclampsia. -Repeat CRP and sed rate. -She has a 2D echo scheduled for later this month. -***  I anticipate that her pericarditic syndrome would resolve spontaneously since it is most likely to be due to viral illness.  Plan to recheck an echocardiogram in roughly 1 month. Plan to retest inflammatory markers as well, if these are still elevated and the pericardial effusion is still present, will do more in-depth evaluation for other conditions such as autoimmune disorders.  Hypertension -***  Disposition - Follow-up in ***     Past Medical History:  Diagnosis Date   Eczema    Heart murmur    When younger   TB lung, latent     Past Surgical History:  Procedure Laterality Date   NO PAST SURGERIES      Current Medications: No outpatient medications have been marked as taking for the 02/04/22 encounter (Appointment) with Warren Lacy, PA-C.      Allergies:   Peanut-containing drug products, Shellfish allergy, Tomato, and Mushroom extract complex   Social History   Socioeconomic History   Marital status: Single    Spouse name: Not on file   Number of children: Not on file   Years of education: Not on file   Highest education level: Not on file  Occupational History   Not on file  Tobacco Use   Smoking status: Never   Smokeless tobacco: Never  Vaping Use   Vaping Use: Never used  Substance and Sexual Activity   Alcohol use: No   Drug use: No   Sexual activity: Not Currently    Birth control/protection: None  Other Topics Concern   Not on file  Social History Narrative   Not on file   Social Determinants of Health   Financial Resource Strain: Not on file  Food Insecurity: No Food Insecurity   Worried About Running Out of Food in the Last Year: Never true   Ran Out of Food in the Last Year: Never true  Transportation Needs: No Transportation Needs   Lack of Transportation (Medical): No   Lack of Transportation (Non-Medical): No  Physical Activity: Not on file  Stress: Not on file  Social Connections: Not on file     Family History: The patient's family history includes Healthy in her father and mother.  ROS:   Please see the history of present illness.     EKGs/Labs/Other Studies Reviewed:    EKG:  The ekg ordered today demonstrates ***  Recent Labs: 01/09/2022: B Natriuretic  Peptide 617.9; Magnesium 2.0 01/11/2022: ALT 51; BUN 8; Creatinine, Ser 0.81; Potassium 4.0; Sodium 141 02/02/2022: Hemoglobin 10.7; Platelets 286   Recent Lipid Panel No results found for: CHOL, TRIG, HDL, LDLCALC, LDLDIRECT  Physical Exam:    VS:  There were no vitals taken for this visit.   No data found.  Wt Readings from Last 3 Encounters:  02/02/22 163 lb (73.9 kg)  01/11/22 170 lb 6 oz (77.3 kg)  01/02/22 191 lb (86.6 kg)     GEN: *** Well nourished, well developed in no acute distress HEENT: Normal NECK: No  JVD; No carotid bruits CARDIAC: ***RRR, no murmurs, rubs, gallops RESPIRATORY:  Clear to auscultation without rales, wheezing or rhonchi  ABDOMEN: Soft, non-tender, non-distended MUSCULOSKELETAL: No edema; No deformity  SKIN: Warm and dry NEUROLOGIC:  Alert and oriented PSYCHIATRIC:  Normal affect     ASSESSMENT AND PLAN   ***   {Are you ordering a CV Procedure (e.g. stress test, cath, DCCV, TEE, etc)?   Press F2        :[id number]}    Medication Adjustments/Labs and Tests Ordered: Current medicines are reviewed at length with the patient today.  Concerns regarding medicines are outlined above.  No orders of the defined types were placed in this encounter.  No orders of the defined types were placed in this encounter.   There are no Patient Instructions on file for this visit.   Signed, Warren Lacy, PA-C  02/03/2022 9:36 PM    [facility]

## 2022-02-04 ENCOUNTER — Ambulatory Visit: Payer: 59 | Admitting: Physician Assistant

## 2022-02-05 ENCOUNTER — Ambulatory Visit (INDEPENDENT_AMBULATORY_CARE_PROVIDER_SITE_OTHER): Payer: 59 | Admitting: Family Medicine

## 2022-02-05 ENCOUNTER — Encounter: Payer: Self-pay | Admitting: Family Medicine

## 2022-02-05 VITALS — BP 109/72 | HR 92 | Wt 160.0 lb

## 2022-02-05 DIAGNOSIS — I301 Infective pericarditis: Secondary | ICD-10-CM

## 2022-02-05 DIAGNOSIS — O1495 Unspecified pre-eclampsia, complicating the puerperium: Secondary | ICD-10-CM

## 2022-02-05 NOTE — Progress Notes (Signed)
[place] Partum Visit Note  Alexis Conley is a 21 y.o. G43P1001 female who presents for a postpartum visit. She is 4 weeks postpartum following a normal spontaneous vaginal delivery.  I have fully reviewed the prenatal and intrapartum course. The delivery was at 40.0 gestational weeks.  Anesthesia: epidural. Postpartum course has been notable for readmission for peripartum cardiomyopathy and severe pre-eclampsia. Baby is doing well. Baby is feeding by bottle - Gerber Gentle . Bleeding no bleeding. Bowel function is normal. Bladder function is normal. Patient is not sexually active. Contraception method is OCP (estrogen/progesterone). Postpartum depression screening: negative.   The pregnancy intention screening data noted above was reviewed. Potential methods of contraception were discussed. The patient elected to proceed with No data recorded.   Edinburgh Postnatal Depression Scale - 02/05/22 1412       Edinburgh Postnatal Depression Scale:  In the Past 7 Days   I have been able to laugh and see the funny side of things. 0    I have looked forward with enjoyment to things. 0    I have blamed myself unnecessarily when things went wrong. 0    I have been anxious or worried for no good reason. 0    I have felt scared or panicky for no good reason. 0    Things have been getting on top of me. 0    I have been so unhappy that I have had difficulty sleeping. 0    I have felt sad or miserable. 0    I have been so unhappy that I have been crying. 0    The thought of harming myself has occurred to me. 0    Edinburgh Postnatal Depression Scale Total 0             Health Maintenance Due  Topic Date Due   TETANUS/TDAP  Never done    The following portions of the patient's history were reviewed and updated as appropriate: allergies, current medications, past family history, past medical history, past social history, past surgical history, and problem list.  Review of Systems Pertinent  items noted in HPI and remainder of comprehensive ROS otherwise negative.  Objective:  BP 109/72   Pulse 92   Wt 160 lb (72.6 kg)   LMP 01/29/2022 (Exact Date)   Breastfeeding No   BMI 29.26 kg/m    General:  alert, cooperative, and appears stated age   Breasts:  not indicated  Lungs: Comfortable on room air  GU exam:  not indicated       Assessment:    There are no diagnoses linked to this encounter.  Normal postpartum exam.   Plan:   Essential components of care per ACOG recommendations:  1.  Mood and well being: Patient with negative depression screening today. Reviewed local resources for support.  - Patient tobacco use? No.   - hx of drug use? No.    2. Infant care and feeding:  -Patient currently breastmilk feeding? No.  -Social determinants of health (SDOH) reviewed in EPIC. No concerns  3. Sexuality, contraception and birth spacing - Patient does not want a pregnancy in the next year.  Desired family size is 1 children.  - Reviewed reproductive life planning. Reviewed contraceptive methods based on pt preferences and effectiveness.  Patient desired Oral Contraceptive today.   - Discussed birth spacing of 18 months  4. Sleep and fatigue -Encouraged family/partner/community support of 4 hrs of uninterrupted sleep to help with mood and fatigue  5. Physical Recovery  - Discussed patients delivery and complications. She describes her labor as good. - Patient had a Vaginal, no problems at delivery. Patient had a 2nd degree laceration. Perineal healing reviewed. Patient expressed understanding - Patient has urinary incontinence? No. - Patient is safe to resume physical and sexual activity  6.  Health Maintenance - HM due items addressed No - up to date - Last pap smear  Diagnosis  Date Value Ref Range Status  07/04/2021 (A)  Final   - Atypical squamous cells of undetermined significance (ASC-US)   Pap smear not done at today's visit.  -Breast Cancer screening  indicated? No.   7. Chronic Disease/Pregnancy Condition follow up: Hypertension - BP excellent today on no meds. Discussed increased risk for lifetime hypertension. - Has Cardiology follow up appointment to go over postpartum admission, not totally clear to me what diagnosis was, but appears to be suspected viral pericarditis. - PCP follow up  Clarnce Flock, MD [facility] [place]

## 2022-02-12 ENCOUNTER — Ambulatory Visit: Payer: 59 | Admitting: Family Medicine

## 2022-02-13 NOTE — Progress Notes (Unsigned)
Cardiology Office Note:    Date:  02/13/2022   ID:  Alexis Conley, DOB Mar 02, 2001, MRN [id number]  PCP:  Clarnce Flock, MD [place] Cardiologist: None   Reason for visit: Hospital follow-up  History of Present Illness:    Alexis Conley is a 21 y.o. female  G1P1 (delivery 01/03/2022) admitted 4/18-4/21/23 due to postpartum preeclampsia and acute pericarditis.  Pt treated with IV Magnesium x24hr and anti-hypertensives to manage her preeclampsia.  Due to the chest pain/SOB- cardio work up was completed that show acute pericarditis with elevated inflammatory markers.  She responded well with loop diuretics and nifedipine.  Her symptoms of pericarditis were very mild and improving therefore NSAIDs were deferred.  NSAIDs would cause further HTN and blunt the effects of diuretics.  Echo showed normal right and left ventricular systolic function.  Small to moderate pericardial effusion.  No LVH or valve disease.  No tamponade.  Today, ***  Acute pericarditis -Following vaginal delivery, hypertensive urgency/delayed preeclampsia. -Repeat CRP and sed rate. -She has a 2D echo scheduled for later this month. -***  I anticipate that her pericarditic syndrome would resolve spontaneously since it is most likely to be due to viral illness.  Plan to recheck an echocardiogram in roughly 1 month. Plan to retest inflammatory markers as well, if these are still elevated and the pericardial effusion is still present, will do more in-depth evaluation for other conditions such as autoimmune disorders.  Hypertension -***  Disposition - Follow-up in ***     Past Medical History:  Diagnosis Date   Eczema    Heart murmur    When younger   TB lung, latent     Past Surgical History:  Procedure Laterality Date   NO PAST SURGERIES      Current Medications: No outpatient medications have been marked as taking for the 02/14/22 encounter (Appointment) with Warren Lacy, PA-C.      Allergies:   Peanut-containing drug products, Shellfish allergy, Tomato, and Mushroom extract complex   Social History   Socioeconomic History   Marital status: Single    Spouse name: Not on file   Number of children: Not on file   Years of education: Not on file   Highest education level: Not on file  Occupational History   Not on file  Tobacco Use   Smoking status: Never   Smokeless tobacco: Never  Vaping Use   Vaping Use: Never used  Substance and Sexual Activity   Alcohol use: No   Drug use: No   Sexual activity: Not Currently    Birth control/protection: None  Other Topics Concern   Not on file  Social History Narrative   Not on file   Social Determinants of Health   Financial Resource Strain: Not on file  Food Insecurity: No Food Insecurity   Worried About Running Out of Food in the Last Year: Never true   Ran Out of Food in the Last Year: Never true  Transportation Needs: No Transportation Needs   Lack of Transportation (Medical): No   Lack of Transportation (Non-Medical): No  Physical Activity: Not on file  Stress: Not on file  Social Connections: Not on file     Family History: The patient's family history includes Healthy in her father and mother.  ROS:   Please see the history of present illness.     EKGs/Labs/Other Studies Reviewed:    EKG:  The ekg ordered today demonstrates ***  Recent Labs: 01/09/2022: B  Natriuretic Peptide 617.9; Magnesium 2.0 01/11/2022: ALT 51; BUN 8; Creatinine, Ser 0.81; Potassium 4.0; Sodium 141 02/02/2022: Hemoglobin 10.7; Platelets 286   Recent Lipid Panel No results found for: CHOL, TRIG, HDL, LDLCALC, LDLDIRECT  Physical Exam:    VS:  LMP 01/29/2022 (Exact Date)    No data found.  Wt Readings from Last 3 Encounters:  02/05/22 160 lb (72.6 kg)  02/02/22 163 lb (73.9 kg)  01/11/22 170 lb 6 oz (77.3 kg)     GEN: *** Well nourished, well developed in no acute distress HEENT: Normal NECK: No JVD; No  carotid bruits CARDIAC: ***RRR, no murmurs, rubs, gallops RESPIRATORY:  Clear to auscultation without rales, wheezing or rhonchi  ABDOMEN: Soft, non-tender, non-distended MUSCULOSKELETAL: No edema; No deformity  SKIN: Warm and dry NEUROLOGIC:  Alert and oriented PSYCHIATRIC:  Normal affect     ASSESSMENT AND PLAN   ***   {Are you ordering a CV Procedure (e.g. stress test, cath, DCCV, TEE, etc)?   Press F2        :[id number]}    Medication Adjustments/Labs and Tests Ordered: Current medicines are reviewed at length with the patient today.  Concerns regarding medicines are outlined above.  No orders of the defined types were placed in this encounter.  No orders of the defined types were placed in this encounter.   There are no Patient Instructions on file for this visit.   Signed, Warren Lacy, PA-C  02/13/2022 11:40 AM    [facility]

## 2022-02-14 ENCOUNTER — Ambulatory Visit: Payer: 59 | Admitting: Physician Assistant

## 2022-02-14 DIAGNOSIS — I301 Infective pericarditis: Secondary | ICD-10-CM

## 2022-02-19 ENCOUNTER — Encounter: Payer: Self-pay | Admitting: Physician Assistant

## 2022-02-20 ENCOUNTER — Ambulatory Visit (HOSPITAL_COMMUNITY): Payer: 59 | Attending: Cardiology

## 2022-02-20 DIAGNOSIS — I5031 Acute diastolic (congestive) heart failure: Secondary | ICD-10-CM | POA: Diagnosis present

## 2022-02-20 LAB — ECHOCARDIOGRAM COMPLETE
Area-P 1/2: 4.24 cm2
S' Lateral: 3.4 cm

## 2022-02-27 NOTE — Progress Notes (Addendum)
Cardiology Clinic Note   Patient Name: Alexis Conley Date of Encounter: 03/01/2022  Primary Care Provider:  Clarnce Flock, MD Primary Cardiologist:  Sanda Klein, MD  Patient Profile    Alexis Conley 21 year old female presents the clinic today for evaluation of her acute diastolic CHF and postpartum preeclampsia.    Past Medical History    Past Medical History:  Diagnosis Date   Eczema    Heart murmur    When younger   TB lung, latent    Past Surgical History:  Procedure Laterality Date   NO PAST SURGERIES      Allergies  Allergies  Allergen Reactions   Peanut-Containing Drug Products Anaphylaxis   Shellfish Allergy Anaphylaxis   Tomato Hives   Mushroom Extract Complex     Childhood allergy.... Does not know reaction.     History of Present Illness    Alexis Conley has a PMH of acute pulmonary edema, severe diastolic hypertension.  She was noted to have severe diastolic hypertension roughly 1 week after vaginal delivery of her first pregnancy.  Her pregnancy was uncomplicated.  She was seen during admission by Dr. Sallyanne Kuster.  She was responding well to nifedipine and loop diuretics.  Her blood pressure was in normal range.  She did not show signs of heart failure or hypervolemia.  Her symptoms of pericarditis were very mild and improving.  Treatment with NSAIDs were not advised due to concern for interference with attempts to treat hypertension.  She wished to breast-feed and recommendation for avoiding colchicine was made.  It was felt that her symptoms were not resolved spontaneously and it was felt that her symptoms were most likely related to viral illness.  Plan for repeat echocardiogram in 1 month was discussed/planned.  Plan to repeat inflammatory markers was made as well.  She presents to the clinic today for follow-up evaluation states she feels well.  She feels that her breathing is returned to normal and her heart rate has returned to normal as  well.  [place]  We reviewed her echocardiogram from April and her more recent echocardiogram.  We reviewed her hospitalization.  She reports that about 3 to 4 weeks prior to delivery she did have a viral illness.  The illness resolved on its own.  She cannot recall any previous episodes of prolonged illness when she was younger or in her teenage years.   She reports that she is now able to lay down comfortably.  I will repeat her inflammatory lab work, have her increase her physical activity as tolerated, give her heart healthy diet information and plan follow-up in 6 months.  Today she denies chest pain, shortness of breath, lower extremity edema, fatigue, palpitations, melena, hematuria, hemoptysis, diaphoresis, weakness, presyncope, syncope, orthopnea, and PND.   Home Medications    Prior to Admission medications   Medication Sig Start Date End Date Taking? Authorizing Provider  acetaminophen (TYLENOL) 325 MG tablet Take 2 tablets (650 mg total) by mouth every 4 (four) hours as needed (for pain scale < 4). Patient taking differently: Take 325 mg by mouth every 4 (four) hours as needed for moderate pain or headache. 01/05/22   Janyth Pupa, DO  ibuprofen (ADVIL) 600 MG tablet Take 1 tablet (600 mg total) by mouth every 6 (six) hours. 01/04/22   Cresenzo-Dishmon, Joaquim Lai, CNM  norethindrone (ORTHO MICRONOR) 0.35 MG tablet Take 1 tablet (0.35 mg total) by mouth daily. Patient  not taking: Reported on 02/05/2022 01/05/22 12/31/22  Janyth Pupa, DO  fluticasone (FLONASE) 50 MCG/ACT nasal spray Place 1-2 sprays into both nostrils daily. 10/05/20 02/22/21  Wieters, Hallie C, PA-C  norgestimate-ethinyl estradiol (SPRINTEC 28) 0.25-35 MG-MCG tablet Take 1 tablet by mouth daily. 03/31/20 08/11/20  Mesner, Corene Cornea, MD    Family History    Family History  Problem Relation Age of Onset   Healthy Mother    Healthy Father    She indicated that her mother is  alive. She indicated that her father is alive.  Social History    Social History   Socioeconomic History   Marital status: Single    Spouse name: Not on file   Number of children: Not on file   Years of education: Not on file   Highest education level: Not on file  Occupational History   Not on file  Tobacco Use   Smoking status: Never   Smokeless tobacco: Never  Vaping Use   Vaping Use: Never used  Substance and Sexual Activity   Alcohol use: No   Drug use: No   Sexual activity: Not Currently    Birth control/protection: None  Other Topics Concern   Not on file  Social History Narrative   Not on file   Social Determinants of Health   Financial Resource Strain: Not on file  Food Insecurity: No Food Insecurity (12/10/2021)   Hunger Vital Sign    Worried About Running Out of Food in the Last Year: Never true    Ran Out of Food in the Last Year: Never true  Transportation Needs: No Transportation Needs (12/10/2021)   PRAPARE - Hydrologist (Medical): No    Lack of Transportation (Non-Medical): No  Physical Activity: Not on file  Stress: Not on file  Social Connections: Not on file  Intimate Partner Violence: Not on file     Review of Systems    General:  No chills, fever, night sweats or weight changes.  Cardiovascular:  No chest pain, dyspnea on exertion, edema, orthopnea, palpitations, paroxysmal nocturnal dyspnea. Dermatological: No rash, lesions/masses Respiratory: No cough, dyspnea Urologic: No hematuria, dysuria Abdominal:   No nausea, vomiting, diarrhea, bright red blood per rectum, melena, or hematemesis Neurologic:  No visual changes, wkns, changes in mental status. All other systems reviewed and are otherwise negative except as noted above.  Physical Exam    VS:  BP 112/68 (BP Location: Left Arm, Patient Position: Sitting, Cuff Size: Normal)   Pulse 62   Ht 5' 2"  (1.575 m)   Wt 167 lb (75.8 kg)   LMP 01/29/2022 (Exact  Date)   BMI 30.54 kg/m  , BMI Body mass index is 30.54 kg/m. GEN: Well nourished, well developed, in no acute distress. HEENT: normal. Neck: Supple, no JVD, carotid bruits, or masses. Cardiac: RRR, no murmurs, rubs, or gallops. No clubbing, cyanosis, edema.  Radials/DP/PT 2+ and equal bilaterally.  Respiratory:  Respirations regular and unlabored, clear to auscultation bilaterally. GI: Soft, nontender, nondistended, BS + x 4. MS: no deformity or atrophy. Skin: warm and dry, no rash. Neuro:  Strength and sensation are intact. Psych: Normal affect.  Accessory Clinical Findings    Recent Labs: 01/09/2022: B Natriuretic Peptide 617.9; Magnesium 2.0 01/11/2022: ALT 51; BUN 8; Creatinine, Ser 0.81; Potassium 4.0; Sodium 141 02/02/2022: Hemoglobin 10.7; Platelets 286   Recent Lipid Panel No results found for: "CHOL", "TRIG", "HDL", "CHOLHDL", "VLDL", "LDLCALC", "LDLDIRECT"  ECG personally reviewed by me today-none  today.  Echocardiogram 01/09/2022  IMPRESSIONS     1. Left ventricular ejection fraction, by estimation, is 60 to 65%. The  left ventricle has normal function. The left ventricle has no regional  wall motion abnormalities. Left ventricular diastolic parameters were  normal.   2. Right ventricular systolic function is normal. The right ventricular  size is normal.   3. The mitral valve is normal in structure. Trivial mitral valve  regurgitation. No evidence of mitral stenosis.   4. No AS morphologically small gradient across AV likely from hyerdynamic  function LVOT not well visualized . The aortic valve is tricuspid. Aortic  valve regurgitation is not visualized. No aortic stenosis is present.   5. The inferior vena cava is normal in size with greater than 50%  respiratory variability, suggesting right atrial pressure of 3 mmHg.  Echocardiogram 02/20/2022  IMPRESSIONS     1. Left ventricular ejection fraction, by estimation, is 55 to 60%. The  left ventricle has  normal function. The left ventricle has no regional  wall motion abnormalities. Left ventricular diastolic parameters were  normal.   2. Right ventricular systolic function is normal. The right ventricular  size is normal.   3. The mitral valve is normal in structure. Trivial mitral valve  regurgitation.   4. The aortic valve is tricuspid. Aortic valve regurgitation is not  visualized. No aortic stenosis is present.   5. The inferior vena cava is normal in size with greater than 50%  respiratory variability, suggesting right atrial pressure of 3 mmHg.  Assessment & Plan   1.  Acute diastolic CHF, acute pericarditis-no increased DOE or activity intolerance.  Follow-up echocardiogram reassuring.  No evidence of pericardial effusion on follow-up echocardiogram.  Has returned to normal daily activities. Increase physical activity as tolerated Continue to monitor  Diastolic hypertension, preeclampsia postpartum-BP today 112/78.  Noted to have severe diastolic hypertension 1 week after vaginal delivery.  Pregnancy was uncomplicated.  Inflammatory markers were elevated.  Echocardiogram showed small-moderate pericardial effusion lateral-right ventricle.  Follow-up echocardiogram 02/20/2022 showed LVEF of 55-60%, no valvular abnormalities.  No evidence of pericardial effusion. Heart healthy low-sodium diet Increase physical activity as tolerated Order ESR and CRP  Disposition: Follow-up with Dr. Sallyanne Kuster in  6 months.  Jossie Ng. Kaien Pezzullo NP-C    03/01/2022, 1:17 PM [address] Office [phone] Fax [phone]  Notice: This dictation was prepared with Dragon dictation along with smaller phrase technology. Any transcriptional errors that result from this process are unintentional and may not be corrected upon review.  I spent 13 minutes examining this patient, reviewing medications, and using patient centered shared decision making involving  her cardiac care.  Prior to her visit I spent greater than 20 minutes reviewing her past medical history,  medications, and prior cardiac tests.

## 2022-03-01 ENCOUNTER — Ambulatory Visit (INDEPENDENT_AMBULATORY_CARE_PROVIDER_SITE_OTHER): Payer: 59 | Admitting: General Practice

## 2022-03-01 ENCOUNTER — Encounter: Payer: Self-pay | Admitting: General Practice

## 2022-03-01 VITALS — BP 112/68 | HR 62 | Ht 62.0 in | Wt 167.0 lb

## 2022-03-01 DIAGNOSIS — I10 Essential (primary) hypertension: Secondary | ICD-10-CM | POA: Diagnosis not present

## 2022-03-01 DIAGNOSIS — I5031 Acute diastolic (congestive) heart failure: Secondary | ICD-10-CM

## 2022-03-01 DIAGNOSIS — O1495 Unspecified pre-eclampsia, complicating the puerperium: Secondary | ICD-10-CM

## 2022-03-01 NOTE — Patient Instructions (Signed)
Medication Instructions:  The current medical regimen is effective;  continue present plan and medications as directed. Please refer to the Current Medication list given to you today.   *If you need a refill on your cardiac medications before your next appointment, please call your pharmacy*  Lab Work:   Testing/Procedures:  CRP AND ESR TODAY NONE If you have labs (blood work) drawn today and your tests are completely normal, you will receive your results only by: [place] (if you have MyChart) OR  A paper copy in the mail If you have any lab test that is abnormal or we need to change your treatment, we will call you to review the results.  Special Instructions PLEASE READ AND FOLLOW HEART HEALTHY DIET  CONTINUE EXERCISING GOAL=150 MINUTES OF MODERATE PHYSICAL ACTIVITY WEEKLY  PLEASE READ AND FOLLOW ATTACHED EXERCISING TIPS  Follow-Up: Your next appointment:  6 month(s) In Person with Alexis Klein, MD ONLY    Please call our office 2 months in advance to schedule this appointment   At [facility], you and your health needs are our priority.  As part of our continuing mission to provide you with exceptional heart care, we have created designated Provider Care Teams.  These Care Teams include your primary Cardiologist (physician) and Advanced Practice Providers (APPs -  Physician Assistants and Nurse Practitioners) who all work together to provide you with the care you need, when you need it.    Important Information About Sugar       Exercising to Stay Healthy To become healthy and stay healthy, it is recommended that you do moderate-intensity and vigorous-intensity exercise. You can tell that you are exercising at a moderate intensity if your heart starts beating faster and you start breathing faster but can still hold a conversation. You can tell that you are exercising at a vigorous intensity if you are breathing much harder and faster and cannot hold a conversation  while exercising. How can exercise benefit me? Exercising regularly is important. It has many health benefits, such as: Improving overall fitness, flexibility, and endurance. Increasing bone density. Helping with weight control. Decreasing body fat. Increasing muscle strength and endurance. Reducing stress and tension, anxiety, depression, or anger. Improving overall health. What guidelines should I follow while exercising? Before you start a new exercise program, talk with your health care provider. Do not exercise so much that you hurt yourself, feel dizzy, or get very short of breath. Wear comfortable clothes and wear shoes with good support. Drink plenty of water while you exercise to prevent dehydration or heat stroke. Work out until your breathing and your heartbeat get faster (moderate intensity). How often should I exercise? Choose an activity that you enjoy, and set realistic goals. Your health care provider can help you make an activity plan that is individually designed and works best for you. Exercise regularly as told by your health care provider. This may include: Doing strength training two times a week, such as: Lifting weights. Using resistance bands. Push-ups. Sit-ups. Yoga. Doing a certain intensity of exercise for a given amount of time. Choose from these options: A total of 150 minutes of moderate-intensity exercise every week. A total of 75 minutes of vigorous-intensity exercise every week. A mix of moderate-intensity and vigorous-intensity exercise every week. Children, pregnant women, people who have not exercised regularly, people who are overweight, and older adults may need to talk with a health care provider about what activities are safe to perform. If you have a medical  condition, be sure to talk with your health care provider before you start a new exercise program. What are some exercise ideas? Moderate-intensity exercise ideas include: Walking 1 mile  (1.6 km) in about 15 minutes. Biking. Hiking. Golfing. Dancing. Water aerobics. Vigorous-intensity exercise ideas include: Walking 4.5 miles (7.2 km) or more in about 1 hour. Jogging or running 5 miles (8 km) in about 1 hour. Biking 10 miles (16.1 km) or more in about 1 hour. Lap swimming. Roller-skating or in-line skating. Cross-country skiing. Vigorous competitive sports, such as football, basketball, and soccer. Jumping rope. Aerobic dancing. What are some everyday activities that can help me get exercise? Yard work, such as: Psychologist, educational. Raking and bagging leaves. Washing your car. Pushing a stroller. Shoveling snow. Gardening. Washing windows or floors. How can I be more active in my day-to-day activities? Use stairs instead of an elevator. Take a walk during your lunch break. If you drive, park your car farther away from your work or school. If you take public transportation, get off one stop early and walk the rest of the way. Stand up or walk around during all of your indoor phone calls. Get up, stretch, and walk around every 30 minutes throughout the day. Enjoy exercise with a friend. Support to continue exercising will help you keep a regular routine of activity. Where to find more information You can find more information about exercising to stay healthy from: [place]. Department of Health and Human Services: [employer].at Centers for Disease Control and Prevention (CDC): http://www.wolf.info/ Summary Exercising regularly is important. It will improve your overall fitness, flexibility, and endurance. Regular exercise will also improve your overall health. It can help you control your weight, reduce stress, and improve your bone density. Do not exercise so much that you hurt yourself, feel dizzy, or get very short of breath. Before you start a new exercise program, talk with your health care provider. This information is not intended to replace advice given to you by your  health care provider. Make sure you discuss any questions you have with your health care provider. Document Revised: 01/05/2021 Document Reviewed: 01/05/2021 Elsevier Patient Education  [place] Heart-healthy meal planning includes: Eating less unhealthy fats. Eating more healthy fats. Making other changes in your diet. Talk with your doctor or a diet specialist (dietitian) to create an eating plan that is right for you. What is my plan?  What are tips for following this plan? Cooking Avoid frying your food. Try to bake, boil, grill, or broil it instead. You can also reduce fat by: Removing the skin from poultry. Removing all visible fats from meats. Steaming vegetables in water or broth. Meal planning  At meals, divide your plate into four equal parts: Fill one-half of your plate with vegetables and green salads. Fill one-fourth of your plate with whole grains. Fill one-fourth of your plate with lean protein foods. Eat 4-5 servings of vegetables per day. A serving of vegetables is: 1 cup of raw or cooked vegetables. 2 cups of raw leafy greens. Eat 4-5 servings of fruit per day. A serving of fruit is: 1 medium whole fruit.  cup of dried fruit.  cup of fresh, frozen, or canned fruit.  cup of 100% fruit juice. Eat more foods that have soluble fiber. These are apples, broccoli, carrots, beans, peas, and barley. Try to get 20-30 g of fiber per day. Eat 4-5 servings of nuts, legumes, and seeds per week: 1 serving of  dried beans or legumes equals  cup after being cooked. 1 serving of nuts is  cup. 1 serving of seeds equals 1 tablespoon. General information Eat more home-cooked food. Eat less restaurant, buffet, and fast food. Limit or avoid alcohol. Limit foods that are high in starch and sugar. Avoid fried foods. Lose weight if you are overweight. Keep track of how much salt (sodium) you eat. This is important if you have high blood  pressure. Ask your doctor to tell you more about this. Try to add vegetarian meals each week. Fats Choose healthy fats. These include olive oil and canola oil, flaxseeds, walnuts, almonds, and seeds. Eat more omega-3 fats. These include salmon, mackerel, sardines, tuna, flaxseed oil, and ground flaxseeds. Try to eat fish at least 2 times each week. Check food labels. Avoid foods with trans fats or high amounts of saturated fat. Limit saturated fats. These are often found in animal products, such as meats, butter, and cream. These are also found in plant foods, such as palm oil, palm kernel oil, and coconut oil. Avoid foods with partially hydrogenated oils in them. These have trans fats. Examples are stick margarine, some tub margarines, cookies, crackers, and other baked goods. What foods can I eat? Fruits All fresh, canned (in natural juice), or frozen fruits. Vegetables Fresh or frozen vegetables (raw, steamed, roasted, or grilled). Green salads. Grains Most grains. Choose whole wheat and whole grains most of the time. Rice and pasta, including brown rice and pastas made with whole wheat. Meats and other proteins Lean, well-trimmed beef, veal, pork, and lamb. Chicken and [place] without skin. All fish and shellfish. Wild duck, rabbit, pheasant, and venison. Egg whites or low-cholesterol egg substitutes. Dried beans, peas, lentils, and tofu. Seeds and most nuts. Dairy Low-fat or nonfat cheeses, including ricotta and mozzarella. Skim or 1% milk that is liquid, powdered, or evaporated. Buttermilk that is made with low-fat milk. Nonfat or low-fat yogurt. Fats and oils Non-hydrogenated (trans-free) margarines. Vegetable oils, including soybean, sesame, sunflower, olive, peanut, safflower, corn, canola, and cottonseed. Salad dressings or mayonnaise made with a vegetable oil. Beverages Mineral water. Coffee and tea. Diet carbonated beverages. Sweets and desserts Sherbet, gelatin, and fruit ice.  Small amounts of dark chocolate. Limit all sweets and desserts. Seasonings and condiments All seasonings and condiments. The items listed above may not be a complete list of foods and drinks you can eat. Contact a dietitian for more options. What foods should I avoid? Fruits Canned fruit in heavy syrup. Fruit in cream or butter sauce. Fried fruit. Limit coconut. Vegetables Vegetables cooked in cheese, cream, or butter sauce. Fried vegetables. Grains Breads that are made with saturated or trans fats, oils, or whole milk. Croissants. Sweet rolls. Donuts. High-fat crackers, such as cheese crackers. Meats and other proteins Fatty meats, such as hot dogs, ribs, sausage, bacon, rib-eye roast or steak. High-fat deli meats, such as salami and bologna. Caviar. Domestic duck and goose. Organ meats, such as liver. Dairy Cream, sour cream, cream cheese, and creamed cottage cheese. Whole-milk cheeses. Whole or 2% milk that is liquid, evaporated, or condensed. Whole buttermilk. Cream sauce or high-fat cheese sauce. Yogurt that is made from whole milk. Fats and oils Meat fat, or shortening. Cocoa butter, hydrogenated oils, palm oil, coconut oil, palm kernel oil. Solid fats and shortenings, including bacon fat, salt pork, lard, and butter. Nondairy cream substitutes. Salad dressings with cheese or sour cream. Beverages Regular sodas and juice drinks with added sugar. Sweets and desserts Frosting. Pudding. Cookies.  Cakes. Pies. Milk chocolate or white chocolate. Buttered syrups. Full-fat ice cream or ice cream drinks. The items listed above may not be a complete list of foods and drinks to avoid. Contact a dietitian for more information. Summary Heart-healthy meal planning includes eating less unhealthy fats, eating more healthy fats, and making other changes in your diet. Eat a balanced diet. This includes fruits and vegetables, low-fat or nonfat dairy, lean protein, nuts and legumes, whole grains, and  heart-healthy oils and fats. This information is not intended to replace advice given to you by your health care provider. Make sure you discuss any questions you have with your health care provider. Document Revised: 01/18/2021 Document Reviewed: 01/18/2021 Elsevier Patient Education  2022 [employer].

## 2022-05-09 ENCOUNTER — Ambulatory Visit
Admission: EM | Admit: 2022-05-09 | Discharge: 2022-05-09 | Disposition: A | Discharge status: Home / Self Care | Payer: 59

## 2022-05-09 DIAGNOSIS — B084 Enteroviral vesicular stomatitis with exanthem: Secondary | ICD-10-CM | POA: Diagnosis not present

## 2022-05-09 DIAGNOSIS — Z20828 Contact with and (suspected) exposure to other viral communicable diseases: Secondary | ICD-10-CM | POA: Diagnosis not present

## 2022-05-09 NOTE — ED Provider Notes (Signed)
[facility]    CSN: [id number] Arrival date & time: 05/09/22  1044    HISTORY   Chief Complaint  Patient presents with   Rash   HPI Alexis Conley is a pleasant, 21 y.o. female who presents to urgent care today. Patient reports having bumps on her hands, her feet, sore throat.  Patient states hand-foot-and-mouth disease has been going around her workplace, states her daughter has blisters on her chest as well.  Patient states she noticed the bumps yesterday.  Patient states she has not had any fever, body aches, chills, cough, runny nose, upset stomach.  Patient states that blisters are painful to touch.  The history is provided by the patient.   Past Medical History:  Diagnosis Date   Eczema    Heart murmur    When younger   TB lung, latent    Patient Active Problem List   Diagnosis Date Noted   Acute pericarditis    Acute respiratory failure with hypoxia ([place]) 01/09/2022   Chest pain 01/09/2022   Elevated troponin 01/09/2022   Hypokalemia 01/09/2022   Hypertension 01/09/2022   Elevated liver enzymes 01/09/2022   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   Preeclampsia in postpartum period 31/51/7616   Acute diastolic heart failure (HCC)    Pericardial effusion    Gestational hypertension 01/03/2022   PROM (premature rupture of membranes) 01/02/2022   Alpha thalassemia silent carrier 09/04/2021   TB lung, latent 08/01/2021   Supervision of high risk pregnancy, antepartum 06/13/2021   Past Surgical History:  Procedure Laterality Date   NO PAST SURGERIES     OB History     Gravida  1   Para  1   Term  1   Preterm      AB      Living  1      SAB      IAB      Ectopic      Multiple  0   Live Births  1          Home Medications    Prior to Admission medications   Medication Sig Start Date End Date Taking? Authorizing Provider  acetaminophen (TYLENOL) 325 MG tablet Take 2 tablets (650 mg total) by mouth every 4 (four) hours as needed (for  pain scale < 4). Patient taking differently: Take 325 mg by mouth every 4 (four) hours as needed for moderate pain or headache. 01/05/22   Janyth Pupa, DO  ibuprofen (ADVIL) 600 MG tablet Take 1 tablet (600 mg total) by mouth every 6 (six) hours. 01/04/22   Cresenzo-Dishmon, Joaquim Lai, CNM  norethindrone (ORTHO MICRONOR) 0.35 MG tablet Take 1 tablet (0.35 mg total) by mouth daily. Patient not taking: Reported on 02/05/2022 01/05/22 12/31/22  Janyth Pupa, DO  fluticasone (FLONASE) 50 MCG/ACT nasal spray Place 1-2 sprays into both nostrils daily. 10/05/20 02/22/21  Wieters, Hallie C, PA-C  norgestimate-ethinyl estradiol (SPRINTEC 28) 0.25-35 MG-MCG tablet Take 1 tablet by mouth daily. 03/31/20 08/11/20  Mesner, Corene Cornea, MD    Family History Family History  Problem Relation Age of Onset   Healthy Mother    Healthy Father    Social History Social History   Tobacco Use   Smoking status: Never   Smokeless tobacco: Never  Vaping Use   Vaping Use: Never used  Substance Use Topics   Alcohol use: No   Drug use: No   Allergies   Peanut-containing drug products, Shellfish allergy, Tomato, and Mushroom extract complex  Review  of Systems Review of Systems Pertinent findings revealed after performing a 14 point review of systems has been noted in the history of present illness.  Physical Exam Triage Vital Signs ED Triage Vitals  Enc Vitals Group     BP 07/20/21 0827 (!) 147/82     Pulse Rate 07/20/21 0827 72     Resp 07/20/21 0827 18     Temp 07/20/21 0827 98.3 F (36.8 C)     Temp Source 07/20/21 0827 Oral     SpO2 07/20/21 0827 98 %     Weight --      Height --      Head Circumference --      Peak Flow --      Pain Score 07/20/21 0826 5     Pain Loc --      Pain Edu? --      Excl. in [place]? --   No data found.  Updated Vital Signs BP 116/71 (BP Location: Left Arm)   Pulse 65   Temp 98.3 F (36.8 C) (Oral)   Resp 18   LMP 04/11/2022   SpO2 100%   Breastfeeding No   Physical  Exam Vitals and nursing note reviewed.  Constitutional:      General: She is not in acute distress.    Appearance: Normal appearance. She is not ill-appearing.  HENT:     Head: Normocephalic and atraumatic.     Salivary Glands: Right salivary gland is not diffusely enlarged or tender. Left salivary gland is not diffusely enlarged or tender.     Right Ear: Tympanic membrane, ear canal and external ear normal. No drainage. No middle ear effusion. There is no impacted cerumen. Tympanic membrane is not erythematous or bulging.     Left Ear: Tympanic membrane, ear canal and external ear normal. No drainage.  No middle ear effusion. There is no impacted cerumen. Tympanic membrane is not erythematous or bulging.     Nose: Nose normal. No nasal deformity, septal deviation, mucosal edema, congestion or rhinorrhea.     Right Turbinates: Not enlarged, swollen or pale.     Left Turbinates: Not enlarged, swollen or pale.     Right Sinus: No maxillary sinus tenderness or frontal sinus tenderness.     Left Sinus: No maxillary sinus tenderness or frontal sinus tenderness.     Mouth/Throat:     Lips: Pink. No lesions.     Mouth: Mucous membranes are moist. No oral lesions.     Pharynx: Oropharynx is clear. Uvula midline. No posterior oropharyngeal erythema or uvula swelling.     Tonsils: No tonsillar exudate. 0 on the right. 0 on the left.  Eyes:     General: Lids are normal.        Right eye: No discharge.        Left eye: No discharge.     Extraocular Movements: Extraocular movements intact.     Conjunctiva/sclera: Conjunctivae normal.     Right eye: Right conjunctiva is not injected.     Left eye: Left conjunctiva is not injected.     Pupils: Pupils are equal, round, and reactive to light.  Neck:     Trachea: Trachea and phonation normal.  Cardiovascular:     Rate and Rhythm: Normal rate and regular rhythm.     Pulses: Normal pulses.     Heart sounds: Normal heart sounds. No murmur heard.    No  friction rub. No gallop.  Pulmonary:     Effort:  Pulmonary effort is normal. No accessory muscle usage, prolonged expiration or respiratory distress.     Breath sounds: Normal breath sounds. No stridor, decreased air movement or transmitted upper airway sounds. No decreased breath sounds, wheezing, rhonchi or rales.  Chest:     Chest wall: No tenderness.  Musculoskeletal:        General: Normal range of motion.     Cervical back: Normal range of motion and neck supple. Normal range of motion.  Lymphadenopathy:     Cervical: No cervical adenopathy.  Skin:    General: Skin is warm and dry.     Findings: Lesion (Single 2 mm blister on medial aspect of left great toe, 3 similar blisters noted on the ventral surface of right hand) present. No erythema or rash.  Neurological:     General: No focal deficit present.     Mental Status: She is alert and oriented to person, place, and time. Mental status is at baseline.  Psychiatric:        Mood and Affect: Mood normal.        Behavior: Behavior normal.        Thought Content: Thought content normal.        Judgment: Judgment normal.     Visual Acuity Right Eye Distance:   Left Eye Distance:   Bilateral Distance:    Right Eye Near:   Left Eye Near:    Bilateral Near:     UC Couse / Diagnostics / Procedures:     Radiology No results found.  Procedures Procedures (including critical care time) EKG  Pending results:  Labs Reviewed - No data to display  Medications Ordered in UC: Medications - No data to display  UC Diagnoses / Final Clinical Impressions(s)   I have reviewed the triage vital signs and the nursing notes.  Pertinent labs & imaging results that were available during my care of the patient were reviewed by me and considered in my medical decision making (see chart for details).    Final diagnoses:  None   Likely hand-foot-and-mouth disease given exposure and presentation.  Patient provided with a note to be out  of work for 3 days, recommend that she follow-up with her PCP to further extend this until all blisters have resolved.  Patient educated that she will continue to be contagious as she has blisters present.  Conservative care recommended, ibuprofen recommended for pain.  ED Prescriptions   None    PDMP not reviewed this encounter.  Disposition Upon Discharge:  Condition: stable for discharge home Home: take medications as prescribed; routine discharge instructions as discussed; follow up as advised.  Patient presented with an acute illness with associated systemic symptoms and significant discomfort requiring urgent management. In my opinion, this is a condition that a prudent lay person (someone who possesses an average knowledge of health and medicine) may potentially expect to result in complications if not addressed urgently such as respiratory distress, impairment of bodily function or dysfunction of bodily organs.   Routine symptom specific, illness specific and/or disease specific instructions were discussed with the patient and/or caregiver at length.   As such, the patient has been evaluated and assessed, work-up was performed and treatment was provided in alignment with urgent care protocols and evidence based medicine.  Patient/parent/caregiver has been advised that the patient may require follow up for further testing and treatment if the symptoms continue in spite of treatment, as clinically indicated and appropriate.  If the patient was tested  for COVID-19, Influenza and/or RSV, then the patient/parent/guardian was advised to isolate at home pending the results of his/her diagnostic coronavirus test and potentially longer if they're positive. I have also advised pt that if his/her COVID-19 test returns positive, it's recommended to self-isolate for at least 10 days after symptoms first appeared AND until fever-free for 24 hours without fever reducer AND other symptoms have improved or  resolved. Discussed self-isolation recommendations as well as instructions for household member/close contacts as per the [facility] and [place] DHHS, and also gave patient the [place] packet with this information.  Patient/parent/caregiver has been advised to return to the [facility] or PCP in 3-5 days if no better; to PCP or the Emergency Department if new signs and symptoms develop, or if the current signs or symptoms continue to change or worsen for further workup, evaluation and treatment as clinically indicated and appropriate  The patient will follow up with their current PCP if and as advised. If the patient does not currently have a PCP we will assist them in obtaining one.   The patient may need specialty follow up if the symptoms continue, in spite of conservative treatment and management, for further workup, evaluation, consultation and treatment as clinically indicated and appropriate.  Patient/parent/caregiver verbalized understanding and agreement of plan as discussed.  All questions were addressed during visit.  Please see discharge instructions below for further details of plan.  Discharge Instructions: Discharge Instructions   None     This office note has been dictated using Dragon speech recognition software.  Unfortunately, this method of dictation can sometimes lead to typographical or grammatical errors.  I apologize for your inconvenience in advance if this occurs.  Please do not hesitate to reach out to me if clarification is needed.      Lynden Oxford Scales, PA-C 05/09/22 1122

## 2022-05-09 NOTE — ED Triage Notes (Addendum)
The pt states she has been having bumps bumps appear over her body, and she has a sore throat. The patient states at her job there has been a spread of hand-foot-mouth.   Started: yesterday (noticed)  Home interventions: none

## 2022-05-09 NOTE — Discharge Instructions (Addendum)
Treatment for hand-foot-and-mouth disease is conservative care only, there are no antivirals or other medications for this.  Is important that you read the enclosed information about hand-foot-and-mouth disease and make sure that you avoid being around others as long as you have active blisters anywhere on your body.  I provided you with a note to be out of work for 3 days, here at urgent care we have can only write you out for 3 days and no more.  Please reach out to your primary care provider for a note extending this time away from work.  You are contagious as long as you have blisters on your body.  Thank you for visiting urgent care today.

## 2022-06-28 ENCOUNTER — Ambulatory Visit
Admission: EM | Admit: 2022-06-28 | Discharge: 2022-06-28 | Disposition: A | Discharge status: Home / Self Care | Payer: 59 | Attending: Internal Medicine | Admitting: Internal Medicine

## 2022-06-28 DIAGNOSIS — L03011 Cellulitis of right finger: Secondary | ICD-10-CM | POA: Diagnosis not present

## 2022-06-28 MED ORDER — CEPHALEXIN 500 MG PO CAPS: 500.0000 mg | ORAL_CAPSULE | Freq: Four times a day (QID) | ORAL | 0 refills | Status: DC

## 2022-06-28 NOTE — ED Triage Notes (Signed)
Patient presents to UC for right index finger swelling and pain x 2 days. Did have a fever 2 days ago. Taking tylenol.

## 2022-06-28 NOTE — Discharge Instructions (Signed)
You have an infection of the end of your finger which is being treated with an antibiotic.  Please also use warm Epsom salt soaks as we discussed.  Follow-up if symptoms persist or worsen.

## 2022-06-28 NOTE — ED Provider Notes (Signed)
[facility]    CSN: [id number] Arrival date & time: 06/28/22  1839      History   Chief Complaint Chief Complaint  Patient presents with   Nail Problem    HPI Alexis Conley is a 21 y.o. female.   Patient presents with 2-day history of right index distal finger swelling, erythema, pain.  Patient reports that she did have associated fever of 102 about 2 days ago.  She took Tylenol which improved fever.  Denies any purulent drainage.  Denies any obvious injury to the finger.     Past Medical History:  Diagnosis Date   Eczema    Heart murmur    When younger   TB lung, latent     Patient Active Problem List   Diagnosis Date Noted   Acute pericarditis    Acute respiratory failure with hypoxia ([place]) 01/09/2022   Chest pain 01/09/2022   Elevated troponin 01/09/2022   Hypokalemia 01/09/2022   Hypertension 01/09/2022   Elevated liver enzymes 01/09/2022   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   Preeclampsia in postpartum period 39/76/7341   Acute diastolic heart failure (HCC)    Pericardial effusion    Gestational hypertension 01/03/2022   PROM (premature rupture of membranes) 01/02/2022   Alpha thalassemia silent carrier 09/04/2021   TB lung, latent 08/01/2021   Supervision of high risk pregnancy, antepartum 06/13/2021    Past Surgical History:  Procedure Laterality Date   NO PAST SURGERIES      OB History     Gravida  1   Para  1   Term  1   Preterm      AB      Living  1      SAB      IAB      Ectopic      Multiple  0   Live Births  1            Home Medications    Prior to Admission medications   Medication Sig Start Date End Date Taking? Authorizing Provider  cephALEXin (KEFLEX) 500 MG capsule Take 1 capsule (500 mg total) by mouth 4 (four) times daily. 06/28/22  Yes Beren Yniguez, Michele Rockers, FNP  acetaminophen (TYLENOL) 325 MG tablet Take 2 tablets (650 mg total) by mouth every 4 (four) hours as needed (for pain scale < 4). Patient  taking differently: Take 325 mg by mouth every 4 (four) hours as needed for moderate pain or headache. 01/05/22   Janyth Pupa, DO  ibuprofen (ADVIL) 600 MG tablet Take 1 tablet (600 mg total) by mouth every 6 (six) hours. 01/04/22   Cresenzo-Dishmon, Joaquim Lai, CNM  norethindrone (ORTHO MICRONOR) 0.35 MG tablet Take 1 tablet (0.35 mg total) by mouth daily. Patient not taking: Reported on 02/05/2022 01/05/22 12/31/22  Janyth Pupa, DO  fluticasone (FLONASE) 50 MCG/ACT nasal spray Place 1-2 sprays into both nostrils daily. 10/05/20 02/22/21  Wieters, Hallie C, PA-C  norgestimate-ethinyl estradiol (SPRINTEC 28) 0.25-35 MG-MCG tablet Take 1 tablet by mouth daily. 03/31/20 08/11/20  Mesner, Corene Cornea, MD    Family History Family History  Problem Relation Age of Onset   Healthy Mother    Healthy Father     Social History Social History   Tobacco Use   Smoking status: Never   Smokeless tobacco: Never  Vaping Use   Vaping Use: Never used  Substance Use Topics   Alcohol use: No   Drug use: No     Allergies   Peanut-containing drug  products, Shellfish allergy, Tomato, and Mushroom extract complex   Review of Systems Review of Systems Per HPI  Physical Exam Triage Vital Signs ED Triage Vitals  Enc Vitals Group     BP 06/28/22 1847 122/73     Pulse Rate 06/28/22 1847 84     Resp 06/28/22 1847 16     Temp 06/28/22 1847 97.7 F (36.5 C)     Temp Source 06/28/22 1847 Oral     SpO2 06/28/22 1847 98 %     Weight --      Height --      Head Circumference --      Peak Flow --      Pain Score 06/28/22 1845 6     Pain Loc --      Pain Edu? --      Excl. in [place]? --    No data found.  Updated Vital Signs BP 122/73 (BP Location: Left Arm)   Pulse 84   Temp 97.7 F (36.5 C) (Oral)   Resp 16   LMP 06/28/2022   SpO2 98%   Breastfeeding No   Visual Acuity Right Eye Distance:   Left Eye Distance:   Bilateral Distance:    Right Eye Near:   Left Eye Near:    Bilateral Near:      Physical Exam Constitutional:      General: She is not in acute distress.    Appearance: Normal appearance. She is not toxic-appearing or diaphoretic.  HENT:     Head: Normocephalic and atraumatic.  Eyes:     Extraocular Movements: Extraocular movements intact.     Conjunctiva/sclera: Conjunctivae normal.  Pulmonary:     Effort: Pulmonary effort is normal.  Skin:    Comments: Patient has erythema and mild swelling present to distal end of right index finger that extends from pad of finger and surrounds medial portion of skin surrounding nail.  No obvious yellow discoloration or pus noted.  Capillary refill and pulses normal.  Patient has full range of motion of finger.  Neurological:     General: No focal deficit present.     Mental Status: She is alert and oriented to person, place, and time. Mental status is at baseline.  Psychiatric:        Mood and Affect: Mood normal.        Behavior: Behavior normal.        Thought Content: Thought content normal.        Judgment: Judgment normal.      UC Treatments / Results  Labs (all labs ordered are listed, but only abnormal results are displayed) Labs Reviewed - No data to display  EKG   Radiology No results found.  Procedures Procedures (including critical care time)  Medications Ordered in UC Medications - No data to display  Initial Impression / Assessment and Plan / UC Course  I have reviewed the triage vital signs and the nursing notes.  Pertinent labs & imaging results that were available during my care of the patient were reviewed by me and considered in my medical decision making (see chart for details).     Physical exam is consistent with paronychia.  There is no I&D indicated given physical exam and no area of fluctuance.  No fever currently but patient was advised to monitor fever very closely and to follow-up if it returns.  Advised warm Epsom salt soaks as well.  Patient advised to follow-up if symptoms  persist or worsen.  Patient verbalized understanding and was agreeable with plan. Final Clinical Impressions(s) / UC Diagnoses   Final diagnoses:  Paronychia of finger of right hand     Discharge Instructions      You have an infection of the end of your finger which is being treated with an antibiotic.  Please also use warm Epsom salt soaks as we discussed.  Follow-up if symptoms persist or worsen.    ED Prescriptions     Medication Sig Dispense Auth. Provider   cephALEXin (KEFLEX) 500 MG capsule Take 1 capsule (500 mg total) by mouth 4 (four) times daily. 28 capsule [place], Michele Rockers, [place]      PDMP not reviewed this encounter.   Teodora Medici, [place] 06/28/22 [phone]

## 2022-07-01 ENCOUNTER — Ambulatory Visit: Payer: 59 | Attending: Nurse Practitioner

## 2022-07-01 ENCOUNTER — Encounter: Payer: Self-pay | Admitting: Internal Medicine

## 2022-07-01 ENCOUNTER — Ambulatory Visit: Payer: 59 | Attending: Family Medicine | Admitting: Internal Medicine

## 2022-07-01 ENCOUNTER — Ambulatory Visit: Payer: 59 | Attending: Nurse Practitioner | Admitting: Nurse Practitioner

## 2022-07-01 ENCOUNTER — Encounter: Payer: Self-pay | Admitting: Nurse Practitioner

## 2022-07-01 VITALS — BP 117/79 | HR 75 | Ht 62.0 in | Wt 169.0 lb

## 2022-07-01 VITALS — BP 118/70 | HR 73 | Ht 62.0 in | Wt 168.0 lb

## 2022-07-01 DIAGNOSIS — G4709 Other insomnia: Secondary | ICD-10-CM | POA: Diagnosis not present

## 2022-07-01 DIAGNOSIS — R072 Precordial pain: Secondary | ICD-10-CM

## 2022-07-01 DIAGNOSIS — Z8679 Personal history of other diseases of the circulatory system: Secondary | ICD-10-CM | POA: Diagnosis not present

## 2022-07-01 DIAGNOSIS — R002 Palpitations: Secondary | ICD-10-CM

## 2022-07-01 DIAGNOSIS — I5031 Acute diastolic (congestive) heart failure: Secondary | ICD-10-CM

## 2022-07-01 DIAGNOSIS — O1495 Unspecified pre-eclampsia, complicating the puerperium: Secondary | ICD-10-CM | POA: Diagnosis not present

## 2022-07-01 DIAGNOSIS — J9859 Other diseases of mediastinum, not elsewhere classified: Secondary | ICD-10-CM | POA: Diagnosis not present

## 2022-07-01 DIAGNOSIS — I10 Essential (primary) hypertension: Secondary | ICD-10-CM | POA: Diagnosis not present

## 2022-07-01 DIAGNOSIS — F419 Anxiety disorder, unspecified: Secondary | ICD-10-CM

## 2022-07-01 NOTE — Progress Notes (Unsigned)
Patient ID: Alexis Conley, female    DOB: April 21, 2001  MRN: [id number]  CC:  insomnia  Subjective: Alexis Conley is a 21 y.o. female who presents for f/u visit.  PCP is Dr. 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   Echo and heart monitor ordered  Not sleeping well for past 2 wks.   Wondered if it could be stress or anx Just lost her child's father 05/26/2022.  He was tragically killed.  Up at nights thinking about his death.  Her child is 38 months old. Gets in bed b/w 10-11 p.m.  She turns TV on, more so for her 2 mths old daughter.  She keeps it on unitl it times out.  Daughter has crib but she allows her to sleep with her in the bed. Goes to [place] to listen to white nosie to help fall a sleep.  Drinks Spirit at nights, just started doing that.   Patient hospitalized in April of this year with postpartum eclampsia and pericarditis.  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  Radiologist recommended further evaluation with a PET/CT may be helpful.  Patient has not had any night sweats.  She had fever a few days ago in relation to an infection in the finger. Patient Active Problem List   Diagnosis Date Noted   Acute pericarditis    Acute respiratory failure with hypoxia (HCC) 01/09/2022   Chest pain 01/09/2022   Elevated troponin 01/09/2022   Hypokalemia 01/09/2022   Hypertension 01/09/2022   Elevated liver enzymes 01/09/2022   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   Preeclampsia in postpartum period 82/99/3716   Acute diastolic heart failure (HCC)    Pericardial effusion    Gestational hypertension 01/03/2022   PROM (premature rupture of membranes) 01/02/2022    Alpha thalassemia silent carrier 09/04/2021   TB lung, latent 08/01/2021   Supervision of high risk pregnancy, antepartum 06/13/2021     Current Outpatient Medications on File Prior to Visit  Medication Sig Dispense Refill   acetaminophen (TYLENOL) 325 MG tablet Take 2 tablets (650 mg total) by mouth every 4 (four) hours as needed (for pain scale < 4). (Patient taking differently: Take 325 mg by mouth every 4 (four) hours as needed for moderate pain or headache.)     cephALEXin (KEFLEX) 500 MG capsule Take 1 capsule (500 mg total) by mouth 4 (four) times daily. 28 capsule 0   norethindrone (ORTHO MICRONOR) 0.35 MG tablet Take 1 tablet (0.35 mg total) by mouth daily. (Patient not taking: Reported on 07/01/2022) 90 tablet 3   [DISCONTINUED] fluticasone (FLONASE) 50 MCG/ACT nasal spray Place 1-2 sprays into both nostrils daily. 16 g 0   [DISCONTINUED] norgestimate-ethinyl estradiol (SPRINTEC 28) 0.25-35 MG-MCG tablet Take 1 tablet by mouth daily. 28 tablet 11   No current facility-administered medications on file prior to visit.    Allergies  Allergen Reactions   Peanut-Containing Drug Products Anaphylaxis   Shellfish Allergy Anaphylaxis   Tomato Hives   Mushroom Extract Complex     Childhood allergy.... Does not know reaction.     Social History   Socioeconomic History   Marital status: Single    Spouse name: Not on file   Number of  children: Not on file   Years of education: Not on file   Highest education level: Not on file  Occupational History   Not on file  Tobacco Use   Smoking status: Never   Smokeless tobacco: Never  Vaping Use   Vaping Use: Never used  Substance and Sexual Activity   Alcohol use: No   Drug use: No   Sexual activity: Not Currently    Birth control/protection: None  Other Topics Concern   Not on file  Social History Narrative   Not on file   Social Determinants of Health   Financial Resource Strain: Not on file  Food Insecurity: No Food  Insecurity (12/10/2021)   Hunger Vital Sign    Worried About Running Out of Food in the Last Year: Never true    Ran Out of Food in the Last Year: Never true  Transportation Needs: No Transportation Needs (12/10/2021)   PRAPARE - Hydrologist (Medical): No    Lack of Transportation (Non-Medical): No  Physical Activity: Not on file  Stress: Not on file  Social Connections: Not on file  Intimate Partner Violence: Not on file    Family History  Problem Relation Age of Onset   Healthy Mother    Healthy Father     Past Surgical History:  Procedure Laterality Date   NO PAST SURGERIES      ROS: Review of Systems Negative except as stated above  PHYSICAL EXAM: BP 117/79   Pulse 75   Ht '5\' 2"'$  (1.575 m)   Wt 169 lb (76.7 kg)   LMP 06/28/2022   SpO2 98%   BMI 30.91 kg/m   Wt Readings from Last 3 Encounters:  07/01/22 169 lb (76.7 kg)  07/01/22 168 lb (76.2 kg)  03/01/22 167 lb (75.8 kg)    Physical Exam  General appearance - alert, well appearing, and in no distress Mental status - normal mood, behavior, speech, dress, motor activity, and thought processes Neck - supple, no significant adenopathy Chest - clear to auscultation, no wheezes, rales or rhonchi, symmetric air entry Heart - normal rate, regular rhythm, normal S1, S2, no murmurs, rubs, clicks or gallops     07/01/2022    2:11 PM 12/31/2021    3:20 PM 12/21/2021    9:31 AM  Depression screen PHQ 2/9  Decreased Interest 0 1 1  Down, Depressed, Hopeless 1 0 0  PHQ - 2 Score '1 1 1  '$ Altered sleeping 3 0 0  Tired, decreased energy 0 0 0  Change in appetite 0 0 0  Feeling bad or failure about yourself  0 0 0  Trouble concentrating 0 0 0  Moving slowly or fidgety/restless 0 0 0  Suicidal thoughts 0 0 0  PHQ-9 Score '4 1 1      '$ 07/01/2022    2:11 PM 12/31/2021    3:20 PM 12/10/2021    2:54 PM 12/03/2021    9:12 AM  GAD 7 : Generalized Anxiety Score  Nervous, Anxious, on Edge '1 1 1 '$ 0   Control/stop worrying 1 0 0 0  Worry too much - different things 1 0 0 0  Trouble relaxing 1 0 0 0  Restless 0 0 0 0  Easily annoyed or irritable 1 0 0 0  Afraid - awful might happen 0 0 0 0  Total GAD 7 Score '5 1 1 '$ 0  Anxiety Difficulty   Not difficult at all  Latest Ref Rng & Units 01/11/2022    4:19 AM 01/10/2022    5:05 AM 01/09/2022    6:29 AM  CMP  Glucose 70 - 99 mg/dL 88  97  108   BUN 6 - 20 mg/dL 8  <5  8   Creatinine 0.44 - 1.00 mg/dL 0.81  0.88  0.89   Sodium 135 - 145 mmol/L 141  133  140   Potassium 3.5 - 5.1 mmol/L 4.0  3.2  3.6   Chloride 98 - 111 mmol/L 109  101  109   CO2 22 - 32 mmol/L '26  24  22   '$ Calcium 8.9 - 10.3 mg/dL 7.8  6.6  8.2   Total Protein 6.5 - 8.1 g/dL 5.9  6.0    Total Bilirubin 0.3 - 1.2 mg/dL 0.3  0.4    Alkaline Phos 38 - 126 U/L 136  158    AST 15 - 41 U/L 17  31    ALT 0 - 44 U/L 51  73     Lipid Panel  No results found for: "CHOL", "TRIG", "HDL", "CHOLHDL", "VLDL", "LDLCALC", "LDLDIRECT"  CBC    Component Value Date/Time   WBC 4.6 02/02/2022 1448   RBC 4.19 02/02/2022 1448   HGB 10.7 (L) 02/02/2022 1448   HGB 11.2 12/18/2021 1707   HCT 34.2 (L) 02/02/2022 1448   HCT 34.0 12/18/2021 1707   PLT 286 02/02/2022 1448   PLT 154 12/18/2021 1707   MCV 81.6 02/02/2022 1448   MCV 80 12/18/2021 1707   MCH 25.5 (L) 02/02/2022 1448   MCHC 31.3 02/02/2022 1448   RDW 16.2 (H) 02/02/2022 1448   RDW 14.4 12/18/2021 1707   LYMPHSABS 1.2 02/02/2022 1448   LYMPHSABS 1.4 12/18/2021 1707   MONOABS 0.5 02/02/2022 1448   EOSABS 0.1 02/02/2022 1448   EOSABS 0.1 12/18/2021 1707   BASOSABS 0.0 02/02/2022 1448   BASOSABS 0.0 12/18/2021 1707    ASSESSMENT AND PLAN:  1. Other insomnia Good sleep hygiene discussed and encouraged. Patient advised not to drink any caffeinated beverages or excessive alcohol use within several hours of bedtime.  Advised to get in bed around about the same time every night.  Once in bed, turn off all lights  and sounds.  If unable to fall asleep within 30 to 45 minutes of getting in bed, patient should get up and try to do something until she feels sleepy again.  At that time try getting back in bed.   2. Anxiety Pt feels she would benefit from some counseling.  I have printed information on behavioral health resources available in the [place] area.  She will call 1 or 2 of them to try to get in for some counseling.  3. Mediastinal mass Advised patient that I will touch base with radiology and see whether we can get away with doing a regular CAT scan to follow-up on this.  Will let her know once I have spoken with radiology  Addendum 07/03/2022 - I spoke with radiologist Dr. Burt Ek.  She recommends doing MRI of chest with and without contrast which will be better in differentiating b/w atypical thymic hyperplasia vs lymphoma. I will have my CMA call her and get this scheduled.  Patient was given the opportunity to ask questions.  Patient verbalized understanding of the plan and was able to repeat key elements of the plan.   This documentation was completed using Radio producer.  Any transcriptional errors are unintentional.  No orders of the defined types were placed in this encounter.    Requested Prescriptions    No prescriptions requested or ordered in this encounter    Return if symptoms worsen or fail to improve.  Karle Plumber, MD, FACP

## 2022-07-01 NOTE — Progress Notes (Signed)
Office Visit    Patient Name: Alexis Conley Date of Encounter: 07/01/2022  Primary Care Provider:  Clarnce Flock, MD Primary Cardiologist:  Sanda Klein, MD  Chief Complaint    21 year old female with a history of acute diastolic heart failure, diastolic hypertension, and postpartum preeclampsia who presents for follow-up related to heart failure and hypertension.  Past Medical History    Past Medical History:  Diagnosis Date   Eczema    Heart murmur    When younger   TB lung, latent    Past Surgical History:  Procedure Laterality Date   NO PAST SURGERIES      Allergies  Allergies  Allergen Reactions   Peanut-Containing Drug Products Anaphylaxis   Shellfish Allergy Anaphylaxis   Tomato Hives   Mushroom Extract Complex     Childhood allergy.... Does not know reaction.     History of Present Illness    21 year old female with the above past medical history including acute diastolic heart failure, diastolic hypertension, and postpartum preeclampsia.   She was noted to have severe diastolic hypertension 1 week following vaginal delivery of her first pregnancy.  She was treated with nifedipine and loop diuretics.  There was concern for pericarditis, treated conservatively given postpartum state.  Additionally, her pericarditic syndrome was thought to be secondary to acute viral illness. Follow-up echocardiogram in 01/2022 showed EF 55 to 60%, normal LV function, no RWMA, no LVH, normal RV systolic function, no significant valvular abnormalities.  She was last seen in the office on 03/01/2022 and was stable from a cardiac standpoint.  Follow-up inflammatory markers (ESR and CRP) were ordered but never completed.  She presents today for follow-up. Since her last visit she has been stable overall from a cardiac standpoint. She has been under a significant amount of personal stress as the father of her child recently died in 06/19/23 of this year. She notes a 3 months  history of frequent chest discomfort which she describes as a heaviness/pressure that mostly occurs when she goes to lay down at night.  She has noted some symptoms when she is sitting up in bed as well.  She denies any exertional symptoms concerning for angina though she does note some fatigue if she overexerts herself.  Additionally, she notes intermittent palpitations that occur mostly at nighttime and last for seconds to minutes at a time-these often accompany her chest discomfort. She has an appointment to establish with a new PCP today.  She wonders if there is a component of anxiety that is contributing to her symptoms.  Other than her chest discomfort and palpitations, she reports feeling well denies any additional concerns today.  Home Medications    Current Outpatient Medications  Medication Sig Dispense Refill   acetaminophen (TYLENOL) 325 MG tablet Take 2 tablets (650 mg total) by mouth every 4 (four) hours as needed (for pain scale < 4). (Patient taking differently: Take 325 mg by mouth every 4 (four) hours as needed for moderate pain or headache.)     cephALEXin (KEFLEX) 500 MG capsule Take 1 capsule (500 mg total) by mouth 4 (four) times daily. 28 capsule 0   norethindrone (ORTHO MICRONOR) 0.35 MG tablet Take 1 tablet (0.35 mg total) by mouth daily. 90 tablet 3   No current facility-administered medications for this visit.     Review of Systems    She denies dyspnea, pnd, orthopnea, n, v, dizziness, syncope, edema, weight gain, or early satiety. All other systems reviewed and are otherwise  negative except as noted above.  Physical Exam    VS:  BP 118/70   Pulse 73   Ht _0  (1.575 m)   Wt 168 lb (76.2 kg)   LMP 06/28/2022   SpO2 100%   BMI 30.73 kg/m  GEN: Well nourished, well developed, in no acute distress. HEENT: normal. Neck: Supple, no JVD, carotid bruits, or masses. Cardiac: RRR, no murmurs, rubs, or gallops. No clubbing, cyanosis, edema.  Radials/DP/PT 2+ and  equal bilaterally.  Respiratory:  Respirations regular and unlabored, clear to auscultation bilaterally. GI: Soft, nontender, nondistended, BS + x 4. MS: no deformity or atrophy. Skin: warm and dry, no rash. Neuro:  Strength and sensation are intact. Psych: Normal affect.  Accessory Clinical Findings    ECG personally reviewed by me today -NSR, 73 bpm, sinus arrhythmia- no acute changes.   Lab Results  Component Value Date   WBC 4.6 02/02/2022   HGB 10.7 (L) 02/02/2022   HCT 34.2 (L) 02/02/2022   MCV 81.6 02/02/2022   PLT 286 02/02/2022   Lab Results  Component Value Date   CREATININE 0.81 01/11/2022   BUN 8 01/11/2022   NA 141 01/11/2022   K 4.0 01/11/2022   CL 109 01/11/2022   CO2 26 01/11/2022   Lab Results  Component Value Date   ALT 51 (H) 01/11/2022   AST 17 01/11/2022   ALKPHOS 136 (H) 01/11/2022   BILITOT 0.3 01/11/2022   No results found for: "CHOL", "HDL", "LDLCALC", "LDLDIRECT", "TRIG", "CHOLHDL"  Lab Results  Component Value Date   HGBA1C 5.2 07/04/2021    Assessment & Plan    1. Chest pain/history of pericarditis/palpitations: No evidence of pericardial effusion on most recent echo.  She reports a 68-monthhistory of persistent chest discomfort that occurs at night.  She describes the pain as a heaviness/pressure. She notes associated intermittent palpitations.  She denies any exertional symptoms concerning for angina, denies dyspnea, PND, orthopnea. Of note, she does report a significant increase in personal stress due to the fact that the father of her daughter recently died.  She questions whether or not anxiety could be contributing to her symptoms.  Given history of possible pericarditis, will repeat echo.  Will check ESR, CRP.  Will check 14-day ZIO.  Discussed ED precautions.   2. History of acute diastolic heart failure: Most recent echo in 01/2022 showed EF 55 to 60%, normal LV function, no RWMA, no LVH, normal RV systolic function, no significant  valvular abnormalities, no evidence of pericardial effusion. Euvolemic and well compensated on exam.  Repeat echo pending as above.  3. Diastolic hypertension: BP well controlled off antihypertensive medications.  4. Disposition: Follow-up in 2 months.      ELenna Sciara NP 07/01/2022, 11:00 AM

## 2022-07-01 NOTE — Progress Notes (Unsigned)
Enrolled for [employer] to mail a ZIO XT long term holter monitor to the patients address on file.   Dr. Croitoru to read. 

## 2022-07-01 NOTE — Patient Instructions (Signed)
[facility] [address], [place], [place] [address] [phone] or [phone] Walk-in urgent care 24/7 for anyone  For [facility] ONLY New patient assessments and therapy walk-ins: Monday and Wednesday 8am-11am First and second Friday 1pm-5pm New patient psychiatry and medication management walk-ins: Mondays, Wednesdays, Thursdays, Fridays 8am-11am No psychiatry walk-ins on Tuesday   *Accepts all insurance and uninsured for Urgent Care needs *Accepts Medicaid and uninsured for outpatient treatment   [facility] (Therapy and psychiatry) [facility] (near [place]) [address] [place], [place] [address] [phone] Fax: [phone] ([place])   [facility] [place],  [place]  [address] [phone] Call for appointment  [facility] of the [place] (Therapy only)  [facility] [address], [place], [place] [address] Monday - Friday: 8:30 a.m.-12 p.m. / 1 p.m.-2:30 p.m.  [facility] [address] [place], [place] [address] Monday-Friday: 8:30 a.m.-12 p.m. / 2-3:30 p.m. (INSURANCE REQUIRED -MEDICAID ACCEPTED) They do offer a sliding fee scale $20-$30/session   [facility] [place], [place] [address] Phone: [place] [address] [place] [address]  Phone: [phone] (Does not accept Medicaid) (only one provider accepts Medicare) [facility] [address] (at [employer]) [place], [place] [phone] (Accepts Medicaid and Medicare)  [facility]  [address]  [place], [place] [address]  Phone: [phone]  [address] [place], [place] [address] Phone: [phone] [facility] Medicaid) [facility] (Therapy only)  [address] [place], [place] [address] Phone: [phone]   [facility] (Therapy only)  [place], [place] [address] Phone: [phone] [facility])   [place] [address] [place], [place] [address] Phone: [phone] ([place]) [employer] [phone] [address] [place], [place] [address] Phone: [phone] [facility]) [facility] (Psychiatry only)  [phone] [address], [place], [place] [address] (Accepts Medicaid and Medicare) [facility] (Psychiatry and therapy)  [place], [place], [place] [address] [phone] [facility] Medicare) [place] (psychiatry and therapy) [address], [place], [place] [address] [phone]  [facility] [address], [place], [place], [place] [address] [phone] Accept [address] [place], [place], [place], [place],  and the following types of Medicaid; [place], [place], [place], [place], [place], [employer], [employer] [place], [place] Complete, and [place], as well as offering a Manufacturing systems engineer and private payment options. Provides In-Office Appointments, Virtual Appointments, and Phone Consultations Offers medication management for ages 46 years old and up, including,  Medication Management for Suboxone and [place] [phone] [address], [place], [place] [address] ([place] Medicaid and Medicare)         19.  [facility] (therapy only)  [address] [place], [place] [address]            [phone] (Accepts medicare) 20. [facility]  (Suboxone and methodone) [phone] [address], [place], [place] [address] To Be Eligible for Opioid Treatment at [facility] you must be at least 23 years of age you have already tried other  interventions that were not successful such as opioid  detox, inpatient rehab for opioids, or outpatient counseling specifically for opioid dependency your [facility] drug test must be completely free of benzodiazepines (klonopin, xanax, valium, ativan, or other benz) you have reliable transportation to the [facility] clinic in [place] you recognize that counseling is a critical component of [facility]' Opioid Program and you agree to attend all required counseling sessions you are committed to total drug abstinence and will conscientiously strive to remain free of alcohol, marijuana, and other illicit substances while in treatment you desire a peaceful treatment atmosphere in which personal responsibility and respect toward staff and clients is the norm   21. [facility] [address], [place], [place] [address] Offers SAIOP (Substance Abuse Intensive Outpatient Program) [phone] 22. [facility] [address] [place], [place] [address] [phone] (Accepts medicare)  For those who are tech savvy, go on psychology today, type in your local city (i.e. [place]) and specify your insurance at the top of the screen after you search. (Medicaid if needed). You can also specify whether you are interested in therapy and psychiatry.  [email or web address]      

## 2022-07-01 NOTE — Patient Instructions (Addendum)
Medication Instructions:  Your physician recommends that you continue on your current medications as directed. Please refer to the Current Medication list given to you today.   *If you need a refill on your cardiac medications before your next appointment, please call your pharmacy*   Lab Work: Complete labs today (orders previously placed)  If you have labs (blood work) drawn today and your tests are completely normal, you will receive your results only by: [place] (if you have MyChart) OR A paper copy in the mail If you have any lab test that is abnormal or we need to change your treatment, we will call you to review the results.   Testing/Procedures: Your physician has requested that you have an echocardiogram. Echocardiography is a painless test that uses sound waves to create images of your heart. It provides your doctor with information about the size and shape of your heart and how well your heart's chambers and valves are working. This procedure takes approximately one hour. There are no restrictions for this procedure.   Follow-Up: At [facility], you and your health needs are our priority.  As part of our continuing mission to provide you with exceptional heart care, we have created designated Provider Care Teams.  These Care Teams include your primary Cardiologist (physician) and Advanced Practice Providers (APPs -  Physician Assistants and Nurse Practitioners) who all work together to provide you with the care you need, when you need it.  ZIO XT- Long Term Monitor Instructions  Your physician has requested you wear a ZIO patch monitor for 14 days.  This is a single patch monitor. [employer] supplies one patch monitor per enrollment. Additional stickers are not available. Please do not apply patch if you will be having a Nuclear Stress Test,  Echocardiogram, Cardiac CT, MRI, or Chest Xray during the period you would be wearing the  monitor. The patch cannot be  worn during these tests. You cannot remove and re-apply the  ZIO XT patch monitor.  Your ZIO patch monitor will be mailed 3 day USPS to your address on file. It may take 3-5 days  to receive your monitor after you have been enrolled.  Once you have received your monitor, please review the enclosed instructions. Your monitor  has already been registered assigning a specific monitor serial # to you.  Billing and Patient Assistance Program Information  We have supplied [employer] with any of your insurance information on file for billing purposes. [employer] offers a sliding scale Patient Assistance Program for patients that do not have  insurance, or whose insurance does not completely cover the cost of the ZIO monitor.  You must apply for the Patient Assistance Program to qualify for this discounted rate.  To apply, please call [employer] at [phone], select option 4, select option 2, ask to apply for  Patient Assistance Program. Theodore Demark will ask your household income, and how many people  are in your household. They will quote your out-of-pocket cost based on that information.  [employer] will also be able to set up a 64-month interest-free payment plan if needed.  Applying the monitor   Shave hair from upper left chest.  Hold abrader disc by orange tab. Rub abrader in 40 strokes over the upper left chest as  indicated in your monitor instructions.  Clean area with 4 enclosed alcohol pads. Let dry.  Apply patch as indicated in monitor instructions. Patch will be placed under collarbone on left  side of chest with arrow pointing  upward.  Rub patch adhesive wings for 2 minutes. Remove white label marked "1". Remove the white  label marked "2". Rub patch adhesive wings for 2 additional minutes.  While looking in a mirror, press and release button in center of patch. A small green light will  flash 3-4 times. This will be your only indicator that the monitor has been turned on.  Do not shower for  the first 24 hours. You may shower after the first 24 hours.  Press the button if you feel a symptom. You will hear a small click. Record Date, Time and  Symptom in the Patient Logbook.  When you are ready to remove the patch, follow instructions on the last 2 pages of Patient  Logbook. Stick patch monitor onto the last page of Patient Logbook.  Place Patient Logbook in the blue and white box. Use locking tab on box and tape box closed  securely. The blue and white box has prepaid postage on it. Please place it in the mailbox as  soon as possible. Your physician should have your test results approximately 7 days after the  monitor has been mailed back to [facility].  Call [place] at [phone] if you have questions regarding  your ZIO XT patch monitor. Call them immediately if you see an orange light blinking on your  monitor.  If your monitor falls off in less than 4 days, contact our Monitor department at [phone].  If your monitor becomes loose or falls off after 4 days call [employer] at [phone] for  suggestions on securing your monitor   We recommend signing up for the patient portal called "MyChart".  Sign up information is provided on this After Visit Summary.  MyChart is used to connect with patients for Virtual Visits (Telemedicine).  Patients are able to view lab/test results, encounter notes, upcoming appointments, etc.  Non-urgent messages can be sent to your provider as well.   To learn more about what you can do with MyChart, go to [email or web address].    Your next appointment:   2 month(s)  The format for your next appointment:   In Person  Provider:   Diona Browner, NP        Other Instructions   Important Information About Sugar

## 2022-07-03 ENCOUNTER — Telehealth: Payer: Self-pay | Admitting: Internal Medicine

## 2022-07-07 DIAGNOSIS — R002 Palpitations: Secondary | ICD-10-CM

## 2022-07-09 ENCOUNTER — Ambulatory Visit: Payer: 59 | Admitting: Family Medicine

## 2022-07-14 NOTE — Telephone Encounter (Signed)
Message sent to [place].

## 2022-07-15 NOTE — Telephone Encounter (Signed)
VM left informing patient to return call. MRI has been scheduled.

## 2022-07-16 ENCOUNTER — Ambulatory Visit (HOSPITAL_COMMUNITY): Payer: 59 | Attending: Nurse Practitioner

## 2022-07-16 DIAGNOSIS — Z8679 Personal history of other diseases of the circulatory system: Secondary | ICD-10-CM

## 2022-07-16 LAB — ECHOCARDIOGRAM COMPLETE
Area-P 1/2: 3.99 cm2
S' Lateral: 3.2 cm

## 2022-07-17 ENCOUNTER — Telehealth: Payer: Self-pay

## 2022-07-17 NOTE — Telephone Encounter (Signed)
Attempted to call pt. Not able to leave a VM. Will call pt back to discuss echo results.

## 2022-07-17 NOTE — Telephone Encounter (Signed)
Pt returned call. Pt was notified of echo results. Pt will continue her current medication and follow up as planned.

## 2022-07-30 ENCOUNTER — Ambulatory Visit (HOSPITAL_COMMUNITY): Admission: RE | Admit: 2022-07-30 | Payer: 59 | Source: Ambulatory Visit

## 2022-08-06 ENCOUNTER — Telehealth: Payer: Self-pay

## 2022-08-06 NOTE — Telephone Encounter (Signed)
Spoke with pt. Pt was notified of monitor results. Pt will continue her current medication and follow up as planned.

## 2022-08-07 ENCOUNTER — Ambulatory Visit (HOSPITAL_COMMUNITY): Payer: 59 | Attending: Internal Medicine

## 2022-08-26 ENCOUNTER — Ambulatory Visit: Payer: 59 | Admitting: Nurse Practitioner

## 2022-08-26 NOTE — Progress Notes (Deleted)
Office Visit    Patient Name: Alexis Conley Date of Encounter: 08/26/2022  Primary Care Provider:  Charlott Rakes, MD Primary Cardiologist:  Sanda Klein, MD  Chief Complaint    21 year old female with a history of acute diastolic heart failure, diastolic hypertension, and postpartum preeclampsia who presents for follow-up related to heart failure and hypertension.   Past Medical History    Past Medical History:  Diagnosis Date   Eczema    Heart murmur    When younger   TB lung, latent    Past Surgical History:  Procedure Laterality Date   NO PAST SURGERIES      Allergies  Allergies  Allergen Reactions   Peanut-Containing Drug Products Anaphylaxis   Shellfish Allergy Anaphylaxis   Tomato Hives   Mushroom Extract Complex     Childhood allergy.... Does not know reaction.     History of Present Illness    21 year old female with the above past medical history including acute diastolic heart failure, diastolic hypertension, and postpartum preeclampsia.    She was noted to have severe diastolic hypertension 1 week following vaginal delivery of her first pregnancy.  She was treated with nifedipine and loop diuretics.  There was concern for pericarditis, treated conservatively given postpartum state.  Additionally, her pericarditic syndrome was thought to be secondary to acute viral illness. Follow-up echocardiogram in 01/2022 showed EF 55 to 60%, normal LV function, no RWMA, no LVH, normal RV systolic function, no significant valvular abnormalities.  Follow-up inflammatory markers (ESR and CRP) were ordered but never completed. She was last seen in the office on 06/12/2022 and noted a significant amount of personal stress as the father of her child died in Jun 06, 2022.  She reported a 31-monthhistory of nonexertional chest discomfort and intermittent palpitations.  Repeat echocardiogram showed EF 60 to 65%, normal LV function, no RWMA, normal RV systolic function, no  significant valvular abnormalities.  14-day Zio patch revealed sinus rhythm, no evidence of arrhythmia.   She presents today for follow-up. Since her last visit she has been    1. Chest pain/history of pericarditis/palpitations: No evidence of pericardial effusion on most recent echo, 14-day ZIO was unremarkable.   2. History of acute diastolic heart failure: Most recent echo in 01/2022 showed EF 55 to 60%, normal LV function, no RWMA, no LVH, normal RV systolic function, no significant valvular abnormalities, no evidence of pericardial effusion. Euvolemic and well compensated on exam.  Repeat echo pending as above.   3. Diastolic hypertension: BP well controlled off antihypertensive medications.   4. Disposition: Follow-up in  Home Medications    Current Outpatient Medications  Medication Sig Dispense Refill   acetaminophen (TYLENOL) 325 MG tablet Take 2 tablets (650 mg total) by mouth every 4 (four) hours as needed (for pain scale < 4). (Patient taking differently: Take 325 mg by mouth every 4 (four) hours as needed for moderate pain or headache.)     cephALEXin (KEFLEX) 500 MG capsule Take 1 capsule (500 mg total) by mouth 4 (four) times daily. 28 capsule 0   norethindrone (ORTHO MICRONOR) 0.35 MG tablet Take 1 tablet (0.35 mg total) by mouth daily. (Patient not taking: Reported on 07/01/2022) 90 tablet 3   No current facility-administered medications for this visit.     Review of Systems    ***.  All other systems reviewed and are otherwise negative except as noted above.    Physical Exam    VS:  There were no vitals taken  for this visit. , BMI There is no height or weight on file to calculate BMI.     GEN: Well nourished, well developed, in no acute distress. HEENT: normal. Neck: Supple, no JVD, carotid bruits, or masses. Cardiac: RRR, no murmurs, rubs, or gallops. No clubbing, cyanosis, edema.  Radials/DP/PT 2+ and equal bilaterally.  Respiratory:  Respirations regular and  unlabored, clear to auscultation bilaterally. GI: Soft, nontender, nondistended, BS + x 4. MS: no deformity or atrophy. Skin: warm and dry, no rash. Neuro:  Strength and sensation are intact. Psych: Normal affect.  Accessory Clinical Findings    ECG personally reviewed by me today - *** - no acute changes.   Lab Results  Component Value Date   WBC 4.6 02/02/2022   HGB 10.7 (L) 02/02/2022   HCT 34.2 (L) 02/02/2022   MCV 81.6 02/02/2022   PLT 286 02/02/2022   Lab Results  Component Value Date   CREATININE 0.81 01/11/2022   BUN 8 01/11/2022   NA 141 01/11/2022   K 4.0 01/11/2022   CL 109 01/11/2022   CO2 26 01/11/2022   Lab Results  Component Value Date   ALT 51 (H) 01/11/2022   AST 17 01/11/2022   ALKPHOS 136 (H) 01/11/2022   BILITOT 0.3 01/11/2022   No results found for: "CHOL", "HDL", "LDLCALC", "LDLDIRECT", "TRIG", "CHOLHDL"  Lab Results  Component Value Date   HGBA1C 5.2 07/04/2021    Assessment & Plan    1.  ***  No BP recorded.  {Refresh Note OR Click here to enter BP  :1}***   Lenna Sciara, NP 08/26/2022, 4:55 AM

## 2022-08-27 IMAGING — US US MFM OB DETAIL+14 WK
1 series · 13 of 28 positions shown · non-contrast
Comparison: none

[Series 1: us mfm ob detail+14 wk · 13 of 103 slices shown]
[im 4/103]
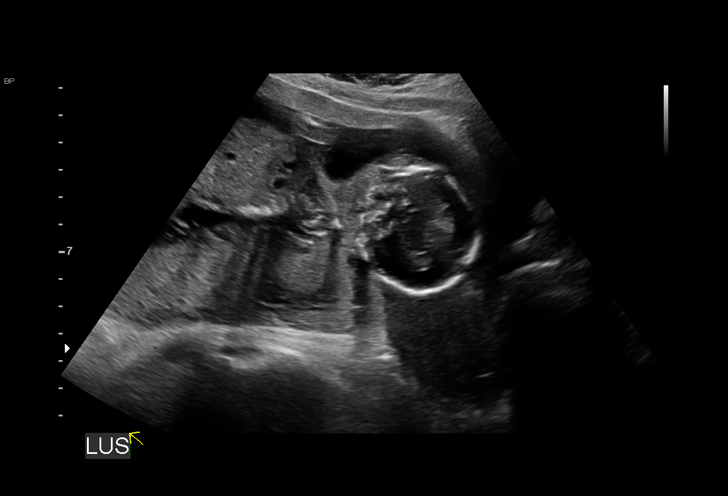
[im 12/103]
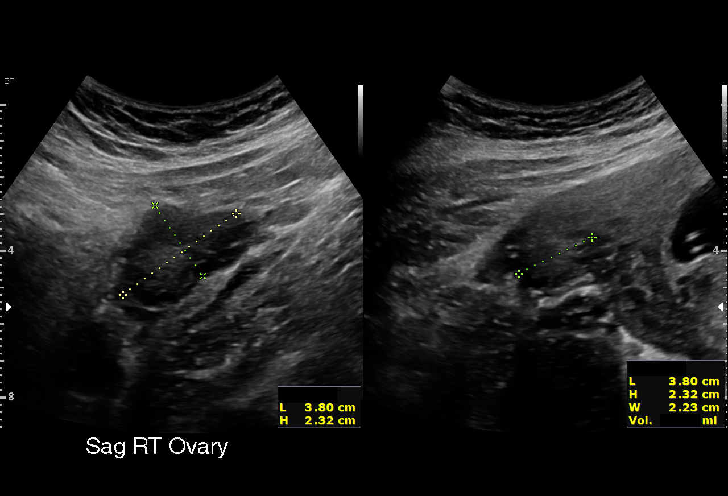
[im 19/103]
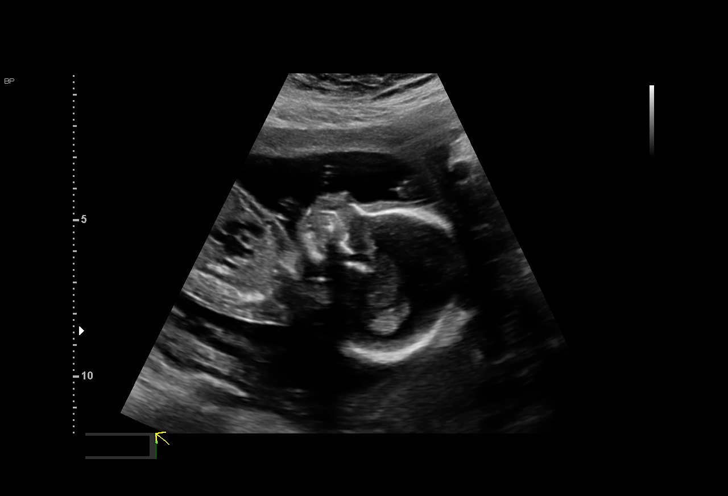
[im 27/103]
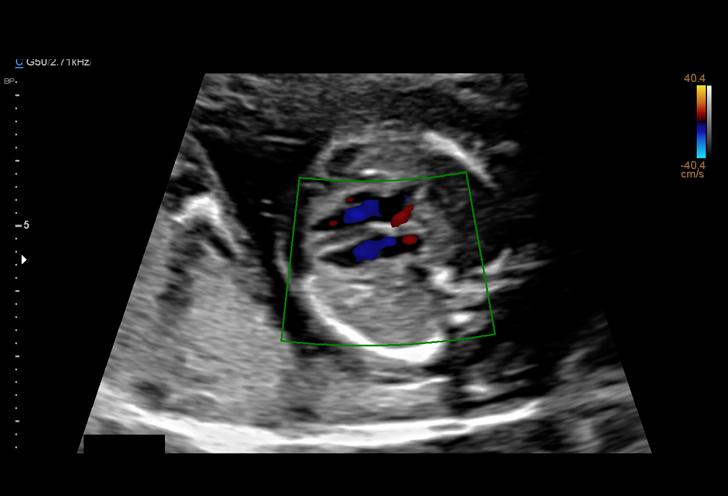
[im 35/103]
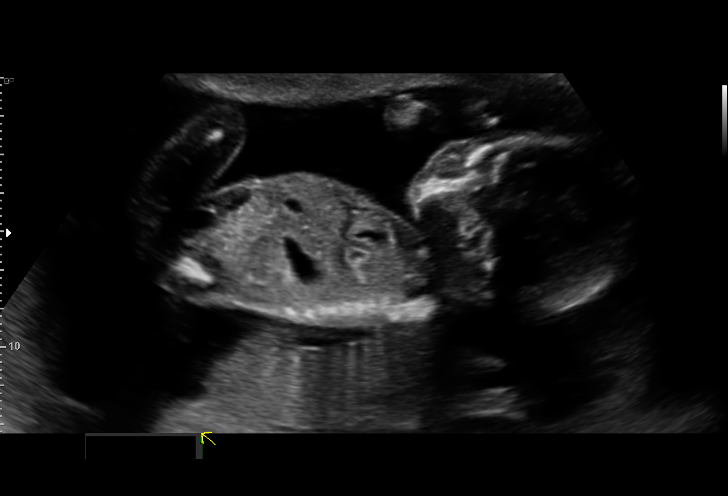
[im 42/103]
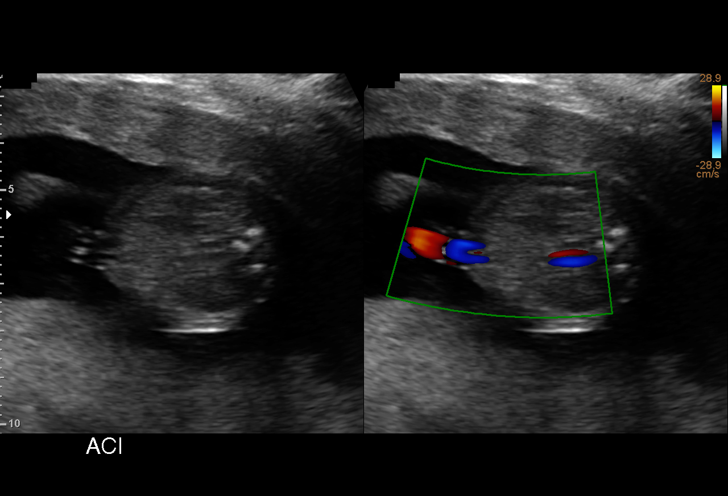
[im 53/103]
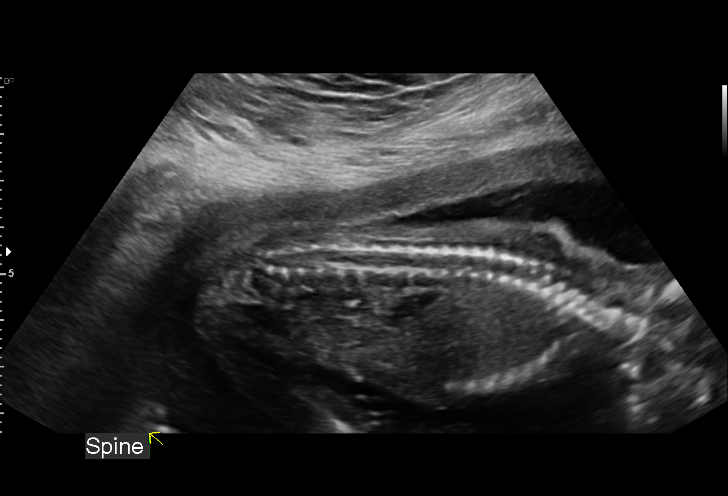
[im 61/103]
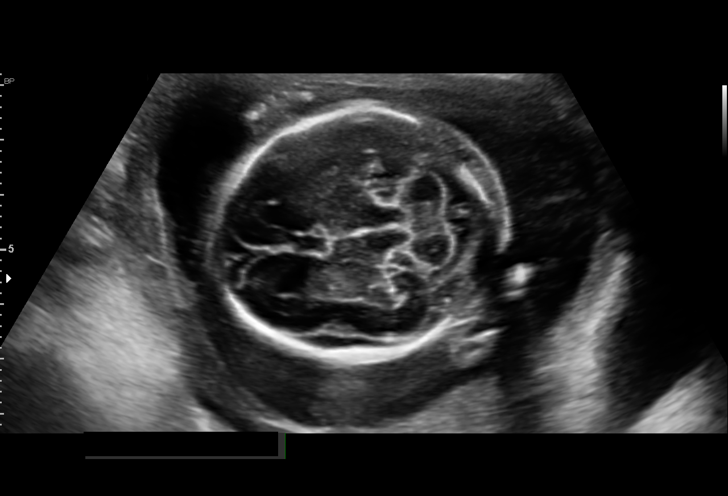
[im 69/103]
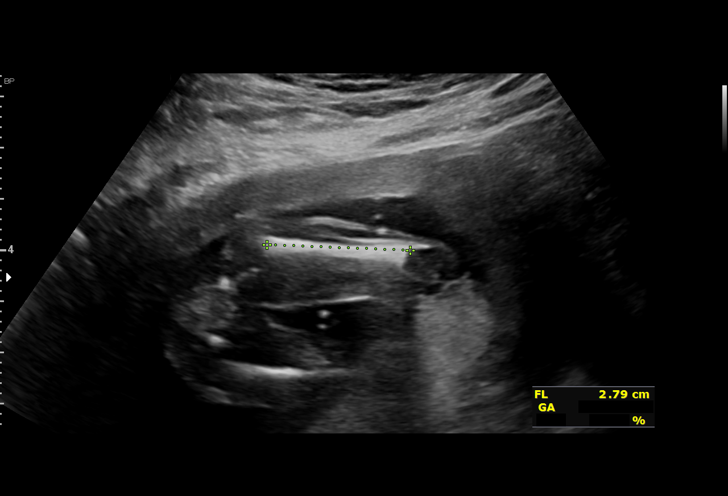
[im 76/103]
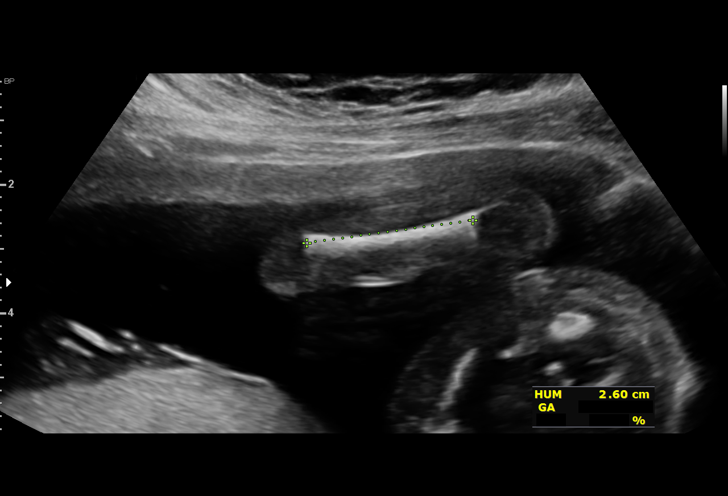
[im 84/103]
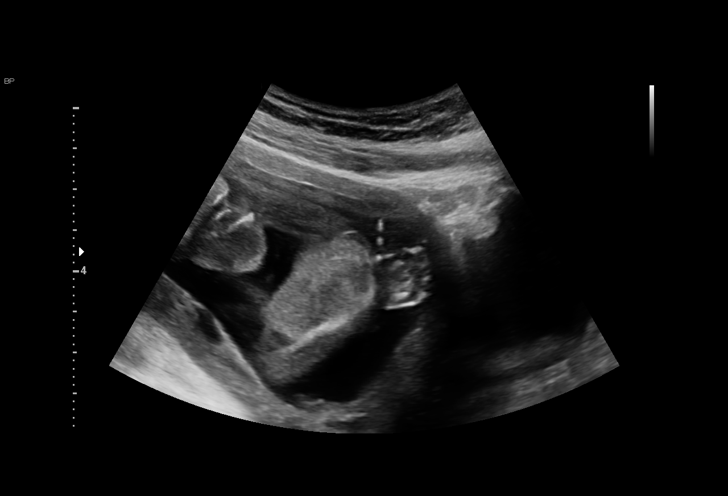
[im 91/103]
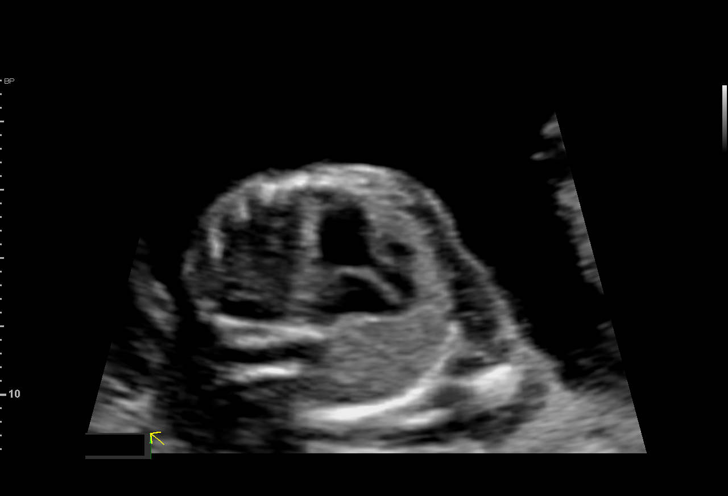
[im 99/103]
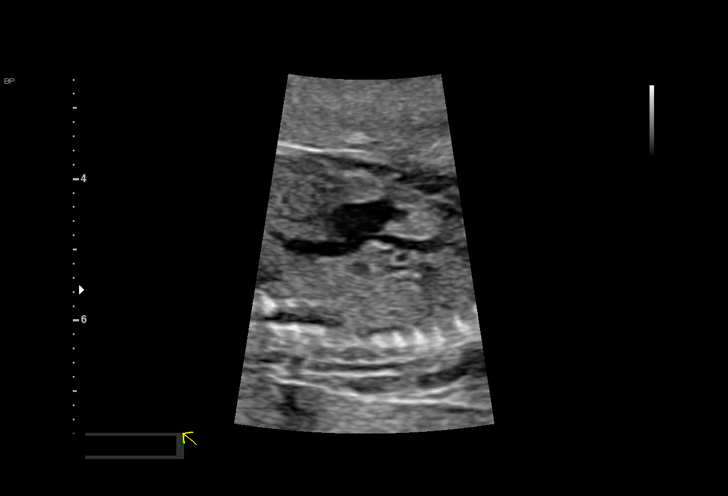

[13 of 28 positions shown; findings below may reference images not displayed]

[id number]

Indications

 Medical complication of pregnancy (Latent
 Tuberculosis)
 19 weeks gestation of pregnancy
 Subchorionic hemorrhage, antepartum
 LR NIPS/ Negative AFP
 Encounter for antenatal screening for
 malformations
Fetal Evaluation

 Num Of Fetuses:         1
 Fetal Heart Rate(bpm):  137
 Cardiac Activity:       Observed
 Presentation:           Cephalic
 Placenta:               Posterior
 P. Cord Insertion:      Visualized, central

 Amniotic Fluid
 AFI FV:      Within normal limits

                             Largest Pocket(cm)

Biometry
 BPD:      43.7  mm     G. Age:  19w 2d         60  %    CI:        74.26   %    70 - 86
                                                         FL/HC:      17.4   %    16.1 -
 HC:       161   mm     G. Age:  18w 6d         38  %    HC/AC:      1.23        1.09 -
 AC:      131.4  mm     G. Age:  18w 5d         34  %    FL/BPD:     64.1   %
 FL:         28  mm     G. Age:  18w 4d         28  %    FL/AC:      21.3   %    20 - 24
 HUM:      26.2  mm     G. Age:  18w 1d         30  %
 CER:      18.7  mm     G. Age:  18w 3d         12  %
 NFT:       3.4  mm

 LV:        6.7  mm
 CM:        5.5  mm

 Est. FW:     251  gm      0 lb 9 oz     27  %
OB History

 Blood Type:   O+
 Gravidity:    1
Gestational Age

 LMP:           19w 0d        Date:  03/29/21                 EDD:   01/03/22
 U/S Today:     18w 6d                                        EDD:   01/04/22
 Best:          19w 0d     Det. By:  LMP  (03/29/21)          EDD:   01/03/22
Anatomy

 Cranium:               Appears normal         LVOT:                   Appears normal
 Cavum:                 Appears normal         Aortic Arch:            Appears normal
 Ventricles:            Appears normal         Ductal Arch:            Appears normal
 Choroid Plexus:        Appears normal         Diaphragm:              Appears normal
 Cerebellum:            Appears normal         Stomach:                Appears normal, left
                                                                       sided
 Posterior Fossa:       Appears normal         Abdomen:                Appears normal
 Nuchal Fold:           Appears normal         Abdominal Wall:         Appears nml (cord
                                                                       insert, abd wall)
 Face:                  Appears normal         Cord Vessels:           Appears normal (3
                        (orbits and profile)                           vessel cord)
 Lips:                  Appears normal         Kidneys:                Appear normal
 Palate:                Not well visualized    Bladder:                Appears normal
 Thoracic:              Appears normal         Spine:                  Appears normal
 Heart:                 Appears normal         Upper Extremities:      Appears normal
                        (4CH, axis, and
                        situs)
 RVOT:                  Appears normal         Lower Extremities:      Appears normal

 Other:  Nasal Bone and Lenses visualized. VC, 3VV and 3VTV visualized.
         Fetus appears to be female. Heels/feet and open hands/5th digits
         visualized.
Cervix Uterus Adnexa

 Cervix
 Length:           3.63  cm.
 Normal appearance by transabdominal scan.
 Uterus
 No abnormality visualized.

 Right Ovary
 Within normal limits.

 Left Ovary
 Within normal limits.

 Cul De Sac
 No free fluid seen.

 Adnexa
 No abnormality visualized.
Impression

 G1 P0.  Patient is here for fetal anatomy scan.  Her medical
 history significant for latent tuberculosis (PPD positive).
 Chest x-ray is negative.  She is not on prophylactic treatment.

 On cell-free fetal DNA screening, the risks of fetal
 aneuploidies are not increased .MSAFP screening showed
 low risk for open-neural tube defects .
 We performed fetal anatomy scan. No makers of
 aneuploidies or fetal structural defects are seen. Fetal
 biometry is consistent with her previously-established dates.
 Amniotic fluid is normal and good fetal activity is seen.
 Patient understands the limitations of ultrasound in detecting
 fetal anomalies.
Recommendations

 -An appointment was made for her to return in 8 weeks for
 fetal growth assessment.
                 Isis, Rafaella

## 2022-09-21 DIAGNOSIS — H5213 Myopia, bilateral: Secondary | ICD-10-CM | POA: Diagnosis not present

## 2022-09-22 DIAGNOSIS — H5213 Myopia, bilateral: Secondary | ICD-10-CM | POA: Diagnosis not present

## 2022-09-30 NOTE — Progress Notes (Deleted)
Office Visit    Patient Name: AFSA MEANY Date of Encounter: 09/30/2022  Primary Care Provider:  Charlott Rakes, MD Primary Cardiologist:  Sanda Klein, MD  Chief Complaint   22 year old female with a history of acute diastolic heart failure, diastolic hypertension, and postpartum preeclampsia who presents for follow-up related to heart failure and hypertension.   Past Medical History    Past Medical History:  Diagnosis Date   Eczema    Heart murmur    When younger   TB lung, latent    Past Surgical History:  Procedure Laterality Date   NO PAST SURGERIES      Allergies  Allergies  Allergen Reactions   Peanut-Containing Drug Products Anaphylaxis   Shellfish Allergy Anaphylaxis   Tomato Hives   Mushroom Extract Complex     Childhood allergy.... Does not know reaction.      Labs/Other Studies Reviewed    The following studies were reviewed today: Echo 07/16/2022: IMPRESSIONS   1. Left ventricular ejection fraction, by estimation, is 60 to 65%. The  left ventricle has normal function. The left ventricle has no regional  wall motion abnormalities. Left ventricular diastolic parameters were  normal. The average left ventricular  global longitudinal strain is -28.8 %. The global longitudinal strain is  normal.   2. Right ventricular systolic function is normal. The right ventricular  size is normal.   3. The mitral valve is normal in structure. Trivial mitral valve  regurgitation. No evidence of mitral stenosis.   4. The aortic valve is tricuspid. Aortic valve regurgitation is not  visualized. No aortic stenosis is present.   5. The inferior vena cava is normal in size with greater than 50%  respiratory variability, suggesting right atrial pressure of 3 mmHg.   Comparison(s): Prior images reviewed side by side. 02/20/22 EF 55-60%,.   14-day Zio 08/01/2022:     Dominant rhythm was normal sinus rhythm with normal circadian variation.   There are very rare  ectopic atrial and ectopic ventricular beats.  There is no evidence of atrial fibrillation, ventricular tachycardia or other complex arrhythmia.  There is no significant bradycardia.   A few symptom triggered recordings were submitted for review and they all show normal rhythm.   Normal rhythm monitor.   Patch Wear Time:  12 days and 5 hours (2023-10-15T19:23:34-0400 to 2023-10-28T01:09:19-0400)   Patient had a min HR of 44 bpm, max HR of 196 bpm, and avg HR of 86 bpm. Predominant underlying rhythm was Sinus Rhythm. Isolated SVEs were rare (<1.0%), and no SVE Couplets or SVE Triplets were present. Isolated VEs were rare (<1.0%), and no VE Couplets  or VE Triplets were present.   Recent Labs: 01/09/2022: B Natriuretic Peptide 617.9; Magnesium 2.0 01/11/2022: ALT 51; BUN 8; Creatinine, Ser 0.81; Potassium 4.0; Sodium 141 02/02/2022: Hemoglobin 10.7; Platelets 286  Recent Lipid Panel No results found for: "CHOL", "TRIG", "HDL", "CHOLHDL", "VLDL", "LDLCALC", "LDLDIRECT"  History of Present Illness    22 year old female with the above past medical history including acute diastolic heart failure, diastolic hypertension, and postpartum preeclampsia.    She was noted to have severe diastolic hypertension 1 week following vaginal delivery of her first pregnancy.  She was treated with nifedipine and loop diuretics.  There was concern for pericarditis, treated conservatively given postpartum state.  Additionally, her pericarditic syndrome was thought to be secondary to acute viral illness. Follow-up echocardiogram in 01/2022 showed EF 55 to 60%, normal LV function, no RWMA, no LVH, normal RV  systolic function, no significant valvular abnormalities.  She was last seen in the office on 07/01/2022 and was stable from a cardiac standpoint.  She did report a significant amount of personal stress as the father of her child died in June 06, 2022.  She noted intermittent palpitations, chest pressure when she laid down  to sleep at night.  She felt that anxiety may have been contributing to her symptoms.  Repeat echo was normal, EF 60 to 65%.  14-day Zio patch showed sinus rhythm, isolated PACs and PVCs, no significant bradycardia, no evidence of atrial fibrillation, ventricular tachycardia or other complex arrhythmia.  She presents today for follow-up. Since her last visit she has been   1. Chest pain/history of pericarditis/palpitations: No evidence of pericardial effusion on most recent echo.  She reports a 60-monthhistory of persistent chest discomfort that occurs at night.  She describes the pain as a heaviness/pressure. She notes associated intermittent palpitations.  She denies any exertional symptoms concerning for angina, denies dyspnea, PND, orthopnea. Of note, she does report a significant increase in personal stress due to the fact that the father of her daughter recently died.  She questions whether or not anxiety could be contributing to her symptoms.  Given history of possible pericarditis, will repeat echo.  Will check ESR, CRP.  Will check 14-day ZIO.  Discussed ED precautions.    2. History of acute diastolic heart failure: Most recent echo in 01/2022 showed EF 55 to 60%, normal LV function, no RWMA, no LVH, normal RV systolic function, no significant valvular abnormalities, no evidence of pericardial effusion. Euvolemic and well compensated on exam.  Repeat echo pending as above.   3. Diastolic hypertension: BP well controlled off antihypertensive medications.   4. Disposition: Follow-up in  Home Medications    Current Outpatient Medications  Medication Sig Dispense Refill   acetaminophen (TYLENOL) 325 MG tablet Take 2 tablets (650 mg total) by mouth every 4 (four) hours as needed (for pain scale < 4). (Patient taking differently: Take 325 mg by mouth every 4 (four) hours as needed for moderate pain or headache.)     cephALEXin (KEFLEX) 500 MG capsule Take 1 capsule (500 mg total) by mouth 4  (four) times daily. 28 capsule 0   norethindrone (ORTHO MICRONOR) 0.35 MG tablet Take 1 tablet (0.35 mg total) by mouth daily. (Patient not taking: Reported on 07/01/2022) 90 tablet 3   No current facility-administered medications for this visit.     Review of Systems    ***.  All other systems reviewed and are otherwise negative except as noted above.    Physical Exam    VS:  There were no vitals taken for this visit. , BMI There is no height or weight on file to calculate BMI.     GEN: Well nourished, well developed, in no acute distress. HEENT: normal. Neck: Supple, no JVD, carotid bruits, or masses. Cardiac: RRR, no murmurs, rubs, or gallops. No clubbing, cyanosis, edema.  Radials/DP/PT 2+ and equal bilaterally.  Respiratory:  Respirations regular and unlabored, clear to auscultation bilaterally. GI: Soft, nontender, nondistended, BS + x 4. MS: no deformity or atrophy. Skin: warm and dry, no rash. Neuro:  Strength and sensation are intact. Psych: Normal affect.  Accessory Clinical Findings    ECG personally reviewed by me today - *** - no acute changes.   Lab Results  Component Value Date   WBC 4.6 02/02/2022   HGB 10.7 (L) 02/02/2022   HCT 34.2 (L) 02/02/2022  MCV 81.6 02/02/2022   PLT 286 02/02/2022   Lab Results  Component Value Date   CREATININE 0.81 01/11/2022   BUN 8 01/11/2022   NA 141 01/11/2022   K 4.0 01/11/2022   CL 109 01/11/2022   CO2 26 01/11/2022   Lab Results  Component Value Date   ALT 51 (H) 01/11/2022   AST 17 01/11/2022   ALKPHOS 136 (H) 01/11/2022   BILITOT 0.3 01/11/2022   No results found for: "CHOL", "HDL", "LDLCALC", "LDLDIRECT", "TRIG", "CHOLHDL"  Lab Results  Component Value Date   HGBA1C 5.2 07/04/2021    Assessment & Plan    1.  ***  No BP recorded.  {Refresh Note OR Click here to enter BP  :1}***   Lenna Sciara, NP 09/30/2022, 5:35 PM

## 2022-10-01 ENCOUNTER — Ambulatory Visit: Payer: 59 | Admitting: Nurse Practitioner

## 2022-10-01 DIAGNOSIS — I5031 Acute diastolic (congestive) heart failure: Secondary | ICD-10-CM

## 2022-10-01 DIAGNOSIS — I10 Essential (primary) hypertension: Secondary | ICD-10-CM

## 2022-10-01 DIAGNOSIS — R002 Palpitations: Secondary | ICD-10-CM

## 2022-10-01 DIAGNOSIS — Z8679 Personal history of other diseases of the circulatory system: Secondary | ICD-10-CM

## 2022-10-01 DIAGNOSIS — R072 Precordial pain: Secondary | ICD-10-CM

## 2022-10-19 NOTE — Progress Notes (Deleted)
Cardiology Office Note:    Date:  10/19/2022   ID:  Alexis Conley, DOB Jun 03, 2001, MRN [id number]  PCP:  Charlott Rakes, MD  Cardiologist:  Sanda Klein, MD  Electrophysiologist:  None   Referring MD: Clarnce Flock, MD   Chief Complaint: follow-up of chest pain and palpitations   History of Present Illness:    Alexis Conley is a 22 y.o. female with a history of pericarditis, acute pulmonary edema in setting of severe hypertension 1 week postpartum, postpartum preeclampsia, and latent TB who is followed by Dr. Sallyanne Kuster and presents today for follow-up of chest pain and palpitations.   Patient was first seen by Cardiology during an admission in 12/2021. She was admitted for postpartum preeclampsia and acute pericarditis after presenting with chest pain and shortness of breath. BP was as high as the 160/110s. High-sensitivity troponin was minimally elevated but flat consistent with demand ischemia. BNP was elevated at 617. Chest x-ray showed mild cardiomegaly with normal caliber central vasculature and interstitial and patchy opacities (left > right) with trace to small bilateral pleural effusions. CRP and Sed Rate were elevated (even when taking into account postpartum state) consistent with an inflammatory condition. Echo showed LVEF of 60-65% with normal wall motion and diastolic parameters and normal RV. Per Dr. Victorino December read, there was also a small to moderate pericardial effusion particularly lateral to the RV. She was felt to have possible acute pericarditis as well as pulmonary edema in setting of hypertensive urgency/ delayed preeclampsia.  She responded well to Nifedipine and Lasix and symptoms of pericarditis essentially resolved. Therefore, she was not started on any anti-inflammatory therapy. Plan was to repeat Echo and inflammatory markers as an outpatient. She was doing well at follow-up visit. Repeat Echo in 01/2022 showed LVEF of 55-60% with no evidence of pericardial  effusion. Repeat CRP and Sed Rate were ordered but looks like patient never had these drawn.   Patient was last seen in 06/2022 at which time she reported a 3 month history of frequent chest discomfort which she described as a heaviness/ pressure that mostly occurred when laying down at night. She also reported intermittent palpitations (mostly at night). She denied any exertional symptoms. CRP and Sed Rate were ordered but patient never had these draw. Repeat Echo and Monitor were also ordered for further evaluation. Echo showed LVEF of 60-65% with normal wall motion and diastolic parameters as well as no significant valvular diease or pericardial effusion. Monitor showed normal sinus rhythm with rare PACs/PVCs but no significant arrhythmias. Triggered events coincided with normal rhythm.   Patient presents today for follow-up. ***  Chest Pain History of Pericarditis History of mild pericarditis in 12/2021. At last visit in 06/2022, patient described recurrent chest pain when laying down. Echo showed LVEF of 60-65% with no evidence of pericardial effusions. CRP and Sed Rate were ordered but patient never had these drawn. - ***  History of Acute Pulmonary  History of acute pulmonary in 12/2021 one week postpartum in setting of hypertensive urgency and delayed preeclampsia. She was diuresed with IV Lasix with improvement in symptoms. Last Echo in 06/2022 showed LVEF of 60-65% with normal wall motion and diastolic parameters as well as no significant valvular diease or pericardial effusion.  - ***  History of Palpitations Monitor in 06/2022 showed normal sinus rhythm with rare PACs/PVCs. - Stable. ***  Postpartum Preeclampsia  History of hypertension in setting of postpartum preeclampsia. She was initially started on Nifedipine but this has since been  stopped. - BP well controlled off any antihypertensives.    Past Medical History:  Diagnosis Date   Eczema    Heart murmur    When younger    TB lung, latent     Past Surgical History:  Procedure Laterality Date   NO PAST SURGERIES      Current Medications: No outpatient medications have been marked as taking for the 10/30/22 encounter (Appointment) with Darreld Mclean, PA-C.     Allergies:   Peanut-containing drug products, Shellfish allergy, Tomato, and Mushroom extract complex   Social History   Socioeconomic History   Marital status: Single    Spouse name: Not on file   Number of children: Not on file   Years of education: Not on file   Highest education level: Not on file  Occupational History   Not on file  Tobacco Use   Smoking status: Never   Smokeless tobacco: Never  Vaping Use   Vaping Use: Never used  Substance and Sexual Activity   Alcohol use: No   Drug use: No   Sexual activity: Not Currently    Birth control/protection: None  Other Topics Concern   Not on file  Social History Narrative   Not on file   Social Determinants of Health   Financial Resource Strain: Not on file  Food Insecurity: No Food Insecurity (12/10/2021)   Hunger Vital Sign    Worried About Running Out of Food in the Last Year: Never true    Ran Out of Food in the Last Year: Never true  Transportation Needs: No Transportation Needs (12/10/2021)   PRAPARE - Hydrologist (Medical): No    Lack of Transportation (Non-Medical): No  Physical Activity: Not on file  Stress: Not on file  Social Connections: Not on file     Family History: The patient's family history includes Healthy in her father and mother.  ROS:   Please see the history of present illness.     EKGs/Labs/Other Studies Reviewed:    The following studies were reviewed:  Echocardiogram 07/16/2022: Impressions:  1. Left ventricular ejection fraction, by estimation, is 60 to 65%. The  left ventricle has normal function. The left ventricle has no regional  wall motion abnormalities. Left ventricular diastolic parameters were   normal. The average left ventricular  global longitudinal strain is -28.8 %. The global longitudinal strain is  normal.   2. Right ventricular systolic function is normal. The right ventricular  size is normal.   3. The mitral valve is normal in structure. Trivial mitral valve  regurgitation. No evidence of mitral stenosis.   4. The aortic valve is tricuspid. Aortic valve regurgitation is not  visualized. No aortic stenosis is present.   5. The inferior vena cava is normal in size with greater than 50%  respiratory variability, suggesting right atrial pressure of 3 mmHg.   Comparison(s): Prior images reviewed side by side. 02/20/22 EF 55-60%. _______________  Monitor 06/2022:   Dominant rhythm was normal sinus rhythm with normal circadian variation.   There are very rare ectopic atrial and ectopic ventricular beats.  There is no evidence of atrial fibrillation, ventricular tachycardia or other complex arrhythmia.  There is no significant bradycardia.   A few symptom triggered recordings were submitted for review and they all show normal rhythm.   EKG:  EKG not ordered today.  Recent Labs: 01/09/2022: B Natriuretic Peptide 617.9; Magnesium 2.0 01/11/2022: ALT 51; BUN 8; Creatinine, Ser 0.81; Potassium  4.0; Sodium 141 02/02/2022: Hemoglobin 10.7; Platelets 286  Recent Lipid Panel No results found for: "CHOL", "TRIG", "HDL", "CHOLHDL", "VLDL", "LDLCALC", "LDLDIRECT"  Physical Exam:    Vital Signs: There were no vitals taken for this visit.    Wt Readings from Last 3 Encounters:  07/01/22 169 lb (76.7 kg)  07/01/22 168 lb (76.2 kg)  03/01/22 167 lb (75.8 kg)     General: 22 y.o. female in no acute distress. HEENT: Normocephalic and atraumatic. Sclera clear. EOMs intact. Neck: Supple. No carotid bruits. No JVD. Heart: *** RRR. Distinct S1 and S2. No murmurs, gallops, or rubs. Radial and distal pedal pulses 2+ and equal bilaterally. Lungs: No increased work of breathing. Clear to  ausculation bilaterally. No wheezes, rhonchi, or rales.  Abdomen: Soft, non-distended, and non-tender to palpation. Bowel sounds present in all 4 quadrants.  MSK: Normal strength and tone for age. *** Extremities: No lower extremity edema.    Skin: Warm and dry. Neuro: Alert and oriented x3. No focal deficits. Psych: Normal affect. Responds appropriately.   Assessment:    No diagnosis found.  Plan:     Disposition: Follow up in ***   Medication Adjustments/Labs and Tests Ordered: Current medicines are reviewed at length with the patient today.  Concerns regarding medicines are outlined above.  No orders of the defined types were placed in this encounter.  No orders of the defined types were placed in this encounter.   There are no Patient Instructions on file for this visit.   Signed, Darreld Mclean, PA-C  10/19/2022 5:51 PM    [facility]

## 2022-10-23 IMAGING — US US MFM OB FOLLOW-UP
2 series · 14 of 28 positions shown · non-contrast
Comparison: none

[Series 1: us mfm ob follow-up · 6 of 26 slices shown (1 of 2)]
[im 3/26]
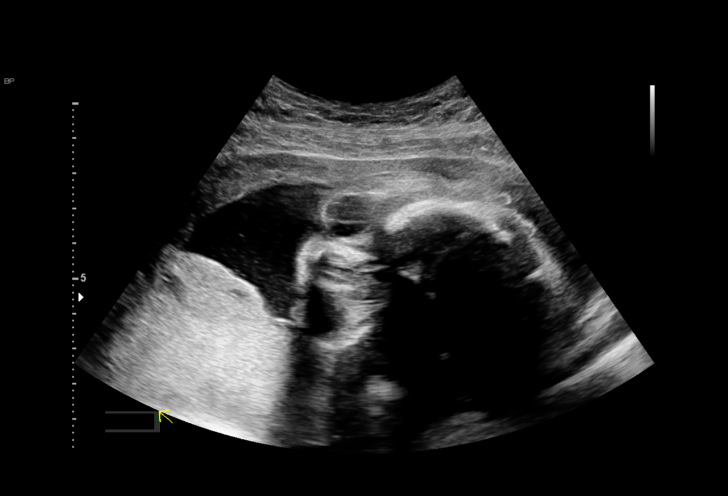
[im 7/26]
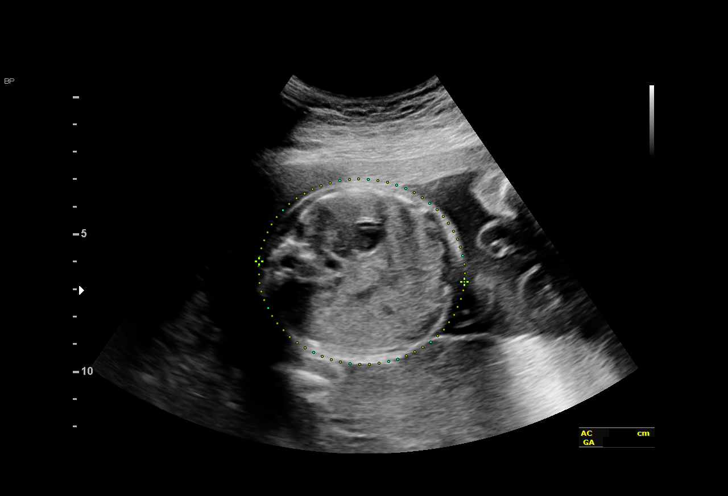
[im 11/26]
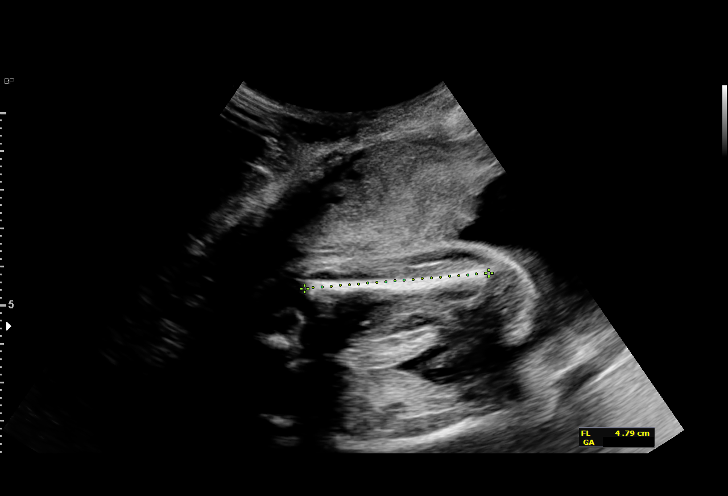
[im 15/26]
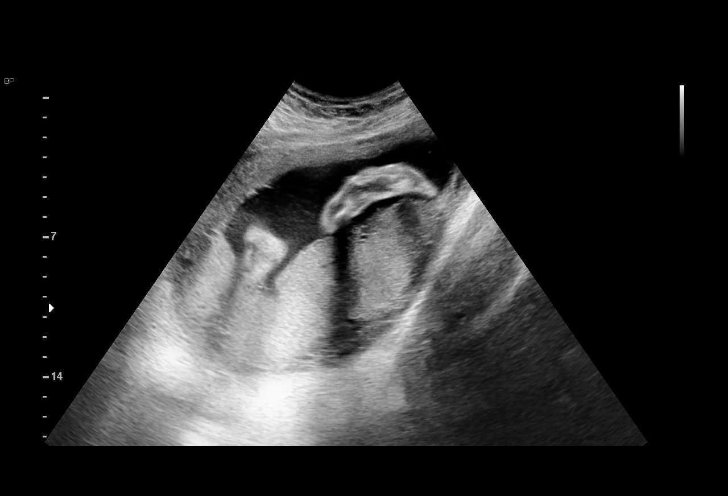
[im 19/26]
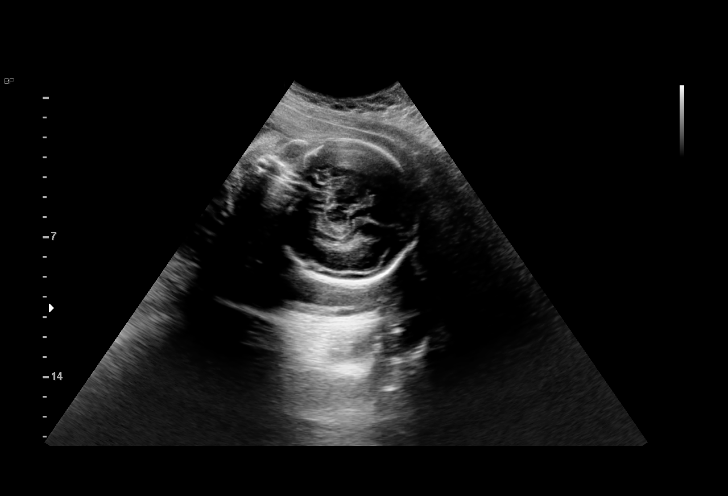
[im 23/26]
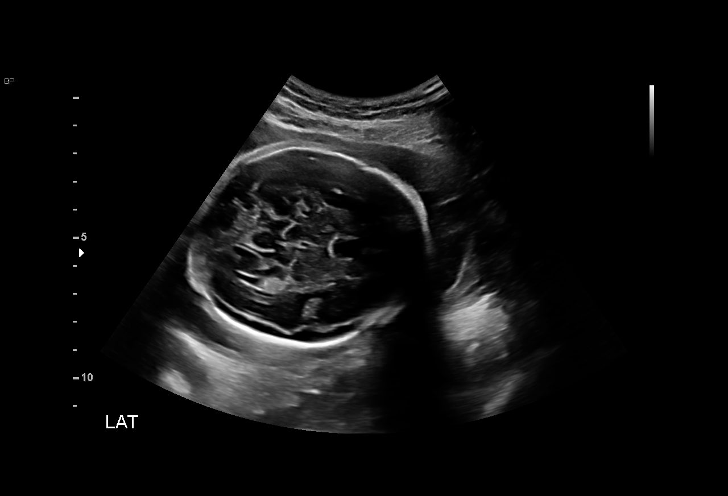

[Series 2: us mfm ob follow-up · 30 acquisitions, 8 frames shown (2 of 2)]
[im 1/30]
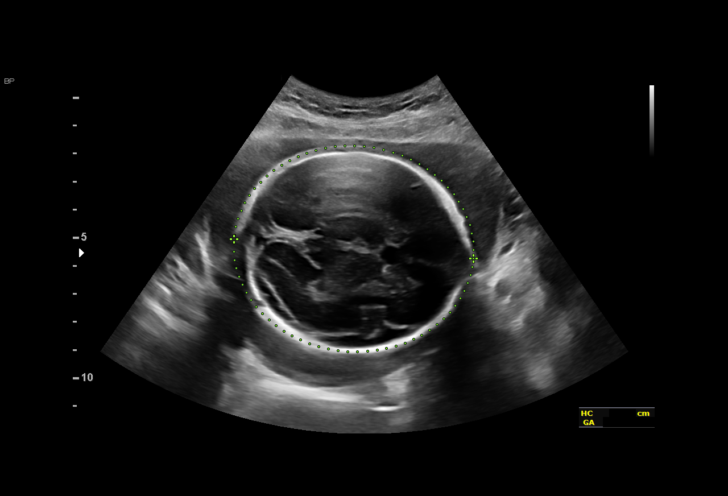
[im 5/30]
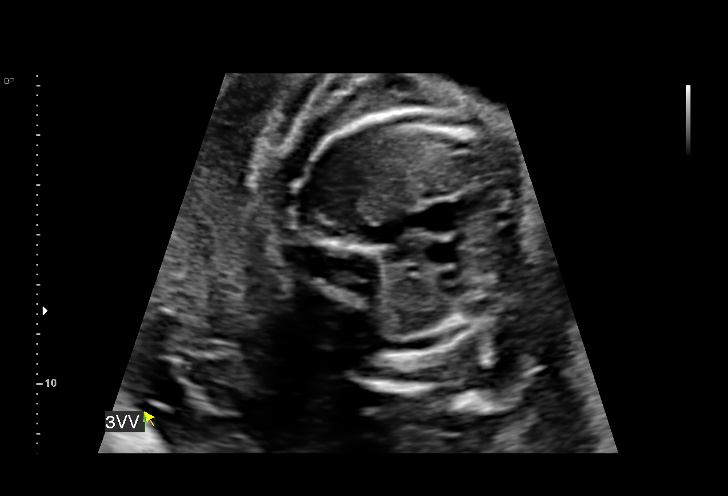
[im 9/30]
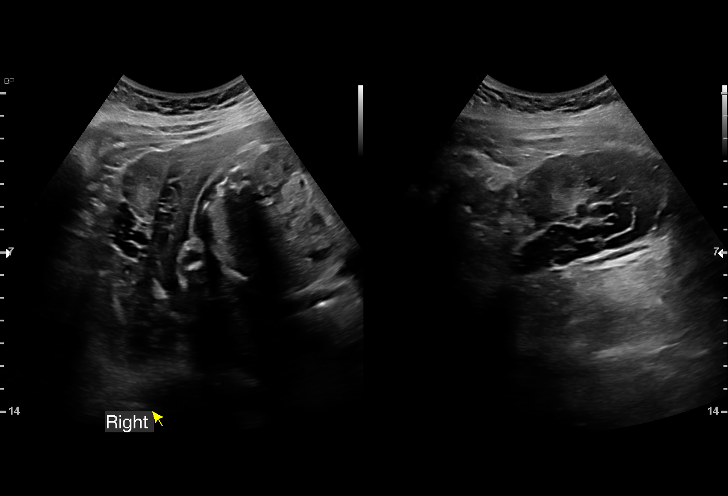
[im 13/30]
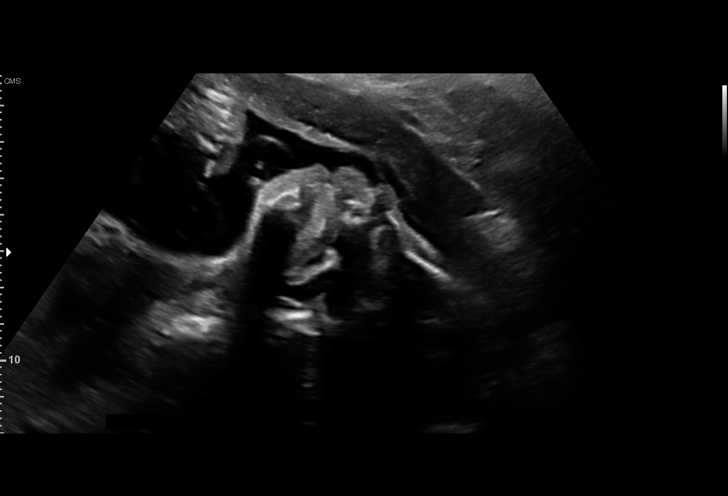
[im 17/30]
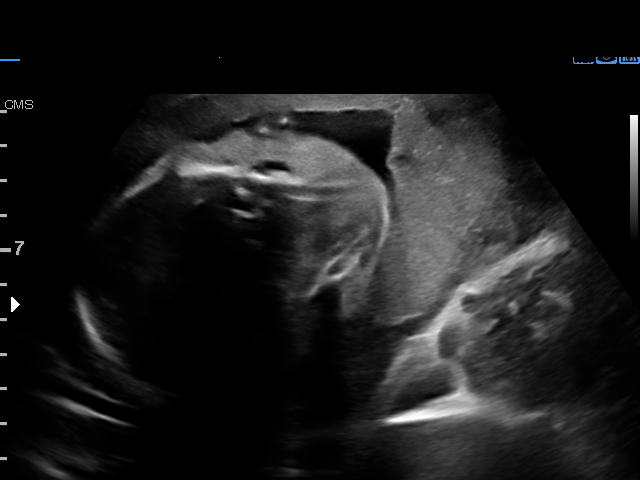
[im 21/30]
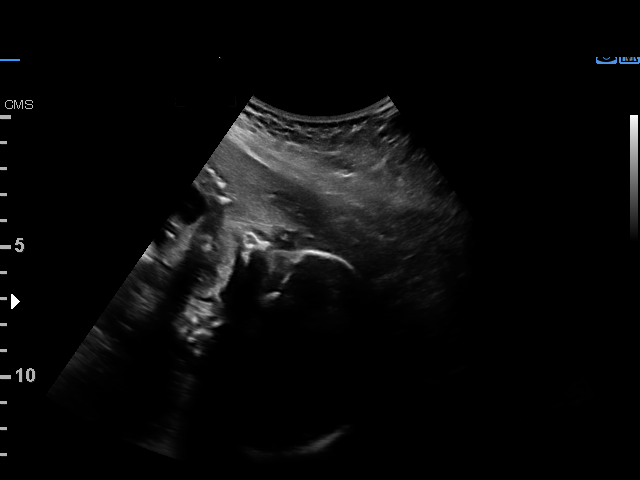
[im 25/30]
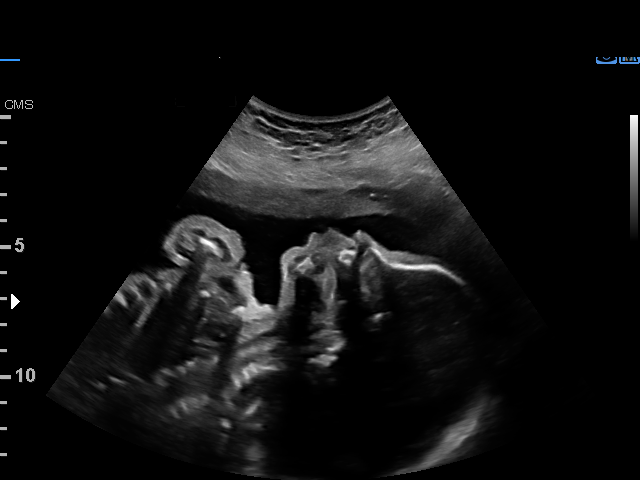
[im 30/30]
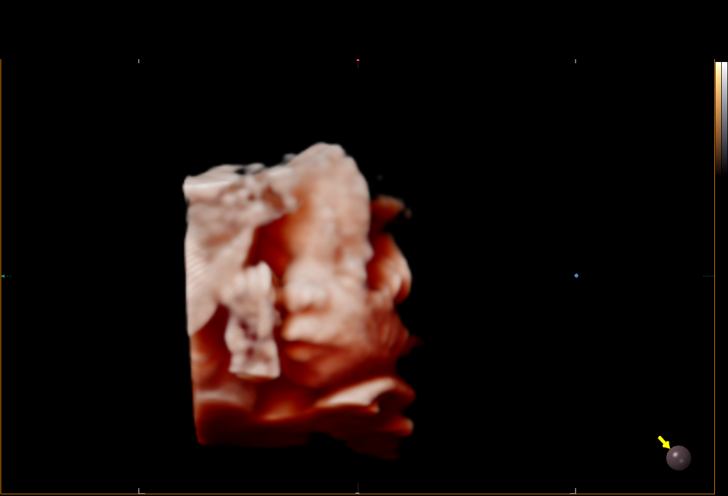

[14 of 28 positions shown; findings below may reference images not displayed]

[id number]

Indications

 27 weeks gestation of pregnancy
 Medical complication of pregnancy (Latent
 Tuberculosis)
 Subchorionic hemorrhage, antepartum -
 resolved
 LR NIPS/ Negative AFP
 Encounter for other antenatal screening
 follow-up
Fetal Evaluation

 Num Of Fetuses:         1
 Fetal Heart Rate(bpm):  133
 Cardiac Activity:       Observed
 Presentation:           Cephalic
 Placenta:               Posterior
 P. Cord Insertion:      Previously Visualized

 Amniotic Fluid
 AFI FV:      Within normal limits

                             Largest Pocket(cm)

Biometry

 BPD:        70  mm     G. Age:  28w 1d         72  %    CI:        76.16   %    70 - 86
                                                         FL/HC:      19.4   %    18.6 -
 HC:      254.2  mm     G. Age:  27w 4d         36  %    HC/AC:      1.14        1.05 -
 AC:      223.2  mm     G. Age:  26w 5d         29  %    FL/BPD:     70.4   %    71 - 87
 FL:       49.3  mm     G. Age:  26w 4d         21  %    FL/AC:      22.1   %    20 - 24

 Est. FW:     993  gm      2 lb 3 oz     27  %
OB History

 Blood Type:   O+
 Gravidity:    1
Gestational Age

 LMP:           27w 1d        Date:  03/29/21                 EDD:   01/03/22
 U/S Today:     27w 2d                                        EDD:   01/02/22
 Best:          27w 1d     Det. By:  LMP  (03/29/21)          EDD:   01/03/22
Anatomy

 Cranium:               Appears normal         LVOT:                   Appears normal
 Cavum:                 Appears normal         Aortic Arch:            Previously seen
 Ventricles:            Appears normal         Ductal Arch:            Previously seen
 Choroid Plexus:        Appears normal         Diaphragm:              Appears normal
 Cerebellum:            Appears normal         Stomach:                Appears normal, left
                                                                       sided
 Posterior Fossa:       Previously seen        Abdomen:                Appears normal
 Nuchal Fold:           Previously seen        Abdominal Wall:         Previously seen
 Face:                  Orbits and profile     Cord Vessels:           Previously seen
                        previously seen
 Lips:                  Appears normal         Kidneys:                Appear normal
 Palate:                Not well visualized    Bladder:                Appears normal
 Thoracic:              Appears normal         Spine:                  Previously seen
 Heart:                 Appears normal         Upper Extremities:      Previously seen
                        (4CH, axis, and
                        situs)
 RVOT:                  Appears normal         Lower Extremities:      Previously seen

 Other:  Nasal Bone and Lenses previously visualized. VC, 3VV and 3VTV
         visualized. Female gender previously seen. Heels/feet and open
         hands/5th digits previously visualized.
Cervix Uterus Adnexa

 Cervix
 Not visualized (advanced GA >03wks)

 Uterus
 No abnormality visualized.

 Right Ovary
 Within normal limits.
 Left Ovary
 Not visualized.

 Cul De Sac
 No free fluid seen.

 Adnexa
 No abnormality visualized.
Impression

 Follow up growth with concerns for subchorionic hemorrhage.
 Normal interval growth with measurements consistent with
 dates
 Good fetal movement and amniotic fluid volume

 A SCH was not observed today.

 Ms. Kempu notes that she has latent TB. After her visit there
 is not documentation of treatment, however, she had a
 normal CXR.[DATE]

 She has a a plan for tx after pregnancy. She is asymptomatic.
Recommendations

 Follow up as clinically indicated.

## 2022-10-29 NOTE — Progress Notes (Deleted)
Cardiology Clinic Note   Patient Name: Alexis Conley Date of Encounter: 10/29/2022  Primary Care Provider:  Charlott Rakes, MD Primary Cardiologist:  Sanda Klein, MD  Patient Profile    Alexis Conley is a 22 y.o. female with a past medical history of history of acute diastolic heart failure, pericardial effusion, pericarditis, diastolic hypertension who presents to the clinic today for month follow-up.  Past Medical History    Past Medical History:  Diagnosis Date   Eczema    Heart murmur    When younger   TB lung, latent    Past Surgical History:  Procedure Laterality Date   NO PAST SURGERIES      Allergies  Allergies  Allergen Reactions   Peanut-Containing Drug Products Anaphylaxis   Shellfish Allergy Anaphylaxis   Tomato Hives   Mushroom Extract Complex     Childhood allergy.... Does not know reaction.     History of Present Illness    Alexis Conley has a past medical history of: History of acute diastolic heart failure/pericardial effusion/pericarditis. Echo 07/16/2022: EF 60 to 65%.  Trivial MR. Diastolic hypertension. Palpitations. 14-day ZIO 08/01/2022: Dominant rhythm was normal sinus rhythm with normal circadian variation.  Very rare ectopic atrial and ectopic ventricular beats.  No evidence of A-fib, V. tach, or complex arrhythmias.  No significant bradycardia.  Triggered recordings showed normal rhythm. Postpartum preeclampsia.  Alexis Conley was first evaluated by Dr. Sallyanne Kuster on 01/09/2022 during a hospital admission 5 days postpartum.  Patient presented to the ED on 01/08/2022 with complaints of 5-day history of shortness of breath and chest pain.  She was noted to have severe diastolic hypertension, acute pulmonary edema and pericarditis.  She responded well to nifedipine and loop diuretics.  Blood pressure was in normal range.  She did not show signs of heart failure or hypervolemia.  Her symptoms of pericarditis were mild and improving.  Treatment  with NSAIDs was not advised due to concern for interference with attempts to treat hypertension.  Colchicine was avoided secondary to patient's desire to breast-feed.  It was felt her symptoms were most likely related to viral illness.  Patient was last seen in the office by Diona Browner, NP on 07/01/2022 for follow-up.  At that time she reported a significant amount of stress secondary to the father of her child dying in September 2023.  She describes some chest heaviness/pressure that was occurring mostly when she lay down at night.  No exertional pain.  She also complained of intermittent palpitations occurring mostly at night and lasting for seconds to minutes often accompanied by chest discomfort.  She underwent repeat echo and 14-day ZIO as outlined above.  Her BP was well-controlled off antihypertensive medications.  Today, patient ***  Palpitations.  In October 2023 patient complained of palpitations mostly occurring at night lasting seconds to minutes and often accompanied by chest discomfort.  14-day ZIO showed normal sinus rhythm without significant arrhythmias.  Very rare ectopic atrial and ectopic ventricular beats.  Patient ***  History of acute diastolic heart failure/pericardial effusion/pericarditis.  Echo October 2023 showed EF 60 to 65%, trivial MR.  Patient *** History of diastolic hypertension.  BP today ***   Home Medications    No outpatient medications have been marked as taking for the 10/30/22 encounter (Appointment) with Darreld Mclean, PA-C.    Family History    Family History  Problem Relation Age of Onset   Healthy Mother    Healthy Father  She indicated that her mother is alive. She indicated that her father is alive.   Social History    Social History   Socioeconomic History   Marital status: Single    Spouse name: Not on file   Number of children: Not on file   Years of education: Not on file   Highest education level: Not on file  Occupational  History   Not on file  Tobacco Use   Smoking status: Never   Smokeless tobacco: Never  Vaping Use   Vaping Use: Never used  Substance and Sexual Activity   Alcohol use: No   Drug use: No   Sexual activity: Not Currently    Birth control/protection: None  Other Topics Concern   Not on file  Social History Narrative   Not on file   Social Determinants of Health   Financial Resource Strain: Not on file  Food Insecurity: No Food Insecurity (12/10/2021)   Hunger Vital Sign    Worried About Running Out of Food in the Last Year: Never true    Ran Out of Food in the Last Year: Never true  Transportation Needs: No Transportation Needs (12/10/2021)   PRAPARE - Hydrologist (Medical): No    Lack of Transportation (Non-Medical): No  Physical Activity: Not on file  Stress: Not on file  Social Connections: Not on file  Intimate Partner Violence: Not on file     Review of Systems    General: *** No chills, fever, night sweats or weight changes.  Cardiovascular:  No chest pain, dyspnea on exertion, edema, orthopnea, palpitations, paroxysmal nocturnal dyspnea. Dermatological: No rash, lesions/masses Respiratory: No cough, dyspnea Urologic: No hematuria, dysuria Abdominal:   No nausea, vomiting, diarrhea, bright red blood per rectum, melena, or hematemesis Neurologic:  No visual changes, weakness, changes in mental status. All other systems reviewed and are otherwise negative except as noted above.  Physical Exam    VS:  There were no vitals taken for this visit. , BMI There is no height or weight on file to calculate BMI. GEN: *** Well nourished, well developed, in no acute distress. HEENT: Normal. Neck: Supple, no JVD, carotid bruits, or masses. Cardiac: RRR, no murmurs, rubs, or gallops. No clubbing, cyanosis, edema.  Radials/DP/PT 2+ and equal bilaterally.  Respiratory:  Respirations regular and unlabored, clear to auscultation bilaterally. GI: Soft,  nontender, nondistended. MS: No deformity or atrophy. Skin: Warm and dry, no rash. Neuro: Strength and sensation are intact. Psych: Normal affect.  Accessory Clinical Findings    The following studies were reviewed for this visit: ***  Recent Labs: 01/09/2022: B Natriuretic Peptide 617.9; Magnesium 2.0 01/11/2022: ALT 51; BUN 8; Creatinine, Ser 0.81; Potassium 4.0; Sodium 141 02/02/2022: Hemoglobin 10.7; Platelets 286   Recent Lipid Panel No results found for: "CHOL", "TRIG", "HDL", "CHOLHDL", "VLDL", "LDLCALC", "LDLDIRECT"  No BP recorded.  {Refresh Note OR Click here to enter BP  :1}***    ECG personally reviewed by me today: ***  No significant changes from ***  {Does this patient have ATRIAL FIBRILLATION?[phone]}   Assessment & Plan   ***      Disposition: ***   Justice Britain. Cedra Villalon, DNP, NP-C     10/29/2022, 7:31 PM [place] [address] Office [phone] Fax [phone]

## 2022-10-30 ENCOUNTER — Ambulatory Visit: Payer: Medicaid Other | Attending: Nurse Practitioner | Admitting: Student

## 2022-11-21 ENCOUNTER — Encounter: Payer: Self-pay | Admitting: Internal Medicine

## 2022-11-21 ENCOUNTER — Ambulatory Visit: Payer: Medicaid Other | Attending: Internal Medicine | Admitting: Internal Medicine

## 2022-11-21 VITALS — BP 104/67 | HR 91 | Temp 98.0°F | Ht 62.0 in | Wt 173.0 lb

## 2022-11-21 DIAGNOSIS — J9859 Other diseases of mediastinum, not elsewhere classified: Secondary | ICD-10-CM | POA: Diagnosis not present

## 2022-11-21 DIAGNOSIS — N938 Other specified abnormal uterine and vaginal bleeding: Secondary | ICD-10-CM | POA: Diagnosis not present

## 2022-11-21 MED ORDER — NORETHINDRONE ACET-ETHINYL EST 1.5-30 MG-MCG PO TABS: 1.0000 | ORAL_TABLET | Freq: Every day | ORAL | 0 refills | Status: DC

## 2022-11-21 NOTE — Progress Notes (Signed)
Patient ID: Alexis Conley, female    DOB: 2001/05/25  MRN: [id number]  CC: Menstrual Problem (Irregular menses after childbirth - menses is lasting 3-4 weeks since Jan 2024/Already received flu vax. )   Subjective: Alexis Conley is a 22 y.o. female who presents for UC visit.  PCP is Dr. Margarita Rana Her concerns today include:  Pt with hx of anx disorder, insomnia, mediastial mass  Reports irregular vaginal bleeding Has been on cycle for 1 mth Menses use to be irregular before last pregnancy - last for several wks with clotting and other months only last several days. Gave birth 12/2021. Menses regular since then; last 5-7 days with light bleeding. Then current cycle came on 10/20/2022 and has persisted.  Light to heavy bleeding. Currently moderate bleeding and having to change pads 3-4x/day.  Not on any method of BC. She has not been sexually active in the past yr.  Last pap 06/2021 - neg HPV/ASCUS. Feels tired Not breast feeding  On last visit 06/2022:  Patient hospitalized in April of this year with postpartum eclampsia and pericarditis. 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 Radiologist recommended further evaluation with a PET/CT may be helpful.   I spoke with radiologist Dr. Burt Ek.  She recommended doing MRI of chest with and without contrast which will be better in differentiating b/w atypical thymic hyperplasia vs lymphoma. MRI was ordered but pt no-showed.  Said she forgot about it.  Would like to have reschedule.  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 (Patient taking differently: Take 325 mg by mouth every 4 (four) hours as needed for moderate pain or headache.)     cephALEXin (KEFLEX) 500 MG capsule Take 1 capsule (500 mg total) by mouth 4 (four) times daily. (Patient not taking: Reported on 11/21/2022) 28 capsule 0   norethindrone (ORTHO MICRONOR) 0.35 MG tablet Take 1 tablet (0.35 mg total) by mouth daily. (Patient not taking: Reported on 07/01/2022) 90 tablet 3   [DISCONTINUED] fluticasone (FLONASE) 50 MCG/ACT nasal spray Place 1-2 sprays into both nostrils daily. 16 g 0   [DISCONTINUED] norgestimate-ethinyl estradiol (SPRINTEC 28) 0.25-35 MG-MCG tablet Take 1 tablet by mouth daily. 28 tablet 11   No current facility-administered medications on file prior to visit.    Allergies  Allergen Reactions   Peanut-Containing Drug Products Anaphylaxis   Shellfish Allergy Anaphylaxis   Tomato Hives   Mushroom Extract Complex     Childhood allergy.... Does not know reaction.     Social History   Socioeconomic History   Marital status: Single  Spouse name: Not on file   Number of children: Not on file   Years of education: Not on file   Highest education level: Not on file  Occupational History   Not on file  Tobacco Use   Smoking status: Never   Smokeless tobacco: Never  Vaping Use   Vaping Use: Never used  Substance and Sexual Activity   Alcohol use: No   Drug use: No   Sexual activity: Not Currently    Birth control/protection:  None  Other Topics Concern   Not on file  Social History Narrative   Not on file   Social Determinants of Health   Financial Resource Strain: Not on file  Food Insecurity: No Food Insecurity (12/10/2021)   Hunger Vital Sign    Worried About Running Out of Food in the Last Year: Never true    Ran Out of Food in the Last Year: Never true  Transportation Needs: No Transportation Needs (12/10/2021)   PRAPARE - Hydrologist (Medical): No    Lack of Transportation (Non-Medical): No  Physical Activity: Not on file  Stress: Not on file  Social Connections: Not on file  Intimate Partner Violence: Not on file    Family History  Problem Relation Age of Onset   Healthy Mother    Healthy Father     Past Surgical History:  Procedure Laterality Date   NO PAST SURGERIES      ROS: Review of Systems Negative except as stated above  PHYSICAL EXAM: BP 104/67 (BP Location: Left Arm, Patient Position: Sitting, Cuff Size: Normal)   Pulse 91   Temp 98 F (36.7 C) (Oral)   Ht '5\' 2"'$  (1.575 m)   Wt 173 lb (78.5 kg)   SpO2 100%   BMI 31.64 kg/m   Physical Exam  General appearance - alert, well appearing, young African-American female and in no distress Mental status - normal mood, behavior, speech, dress, motor activity, and thought processes Eyes -slightly pale conjunctiva Neck - supple, no significant adenopathy Chest - clear to auscultation, no wheezes, rales or rhonchi, symmetric air entry Heart - normal rate, regular rhythm, normal S1, S2, no murmurs, rubs, clicks or gallops Abdomen - soft, nontender, nondistended, no masses or organomegaly Extremities -no lower extremity edema      Latest Ref Rng & Units 01/11/2022    4:19 AM 01/10/2022    5:05 AM 01/09/2022    6:29 AM  CMP  Glucose 70 - 99 mg/dL 88  97  108   BUN 6 - 20 mg/dL 8  <5  8   Creatinine 0.44 - 1.00 mg/dL 0.81  0.88  0.89   Sodium 135 - 145 mmol/L 141  133  140   Potassium 3.5 - 5.1  mmol/L 4.0  3.2  3.6   Chloride 98 - 111 mmol/L 109  101  109   CO2 22 - 32 mmol/L '26  24  22   '$ Calcium 8.9 - 10.3 mg/dL 7.8  6.6  8.2   Total Protein 6.5 - 8.1 g/dL 5.9  6.0    Total Bilirubin 0.3 - 1.2 mg/dL 0.3  0.4    Alkaline Phos 38 - 126 U/L 136  158    AST 15 - 41 U/L 17  31    ALT 0 - 44 U/L 51  73     Lipid Panel  No results found for: "CHOL", "TRIG", "HDL", "CHOLHDL", "VLDL", "LDLCALC", "LDLDIRECT"  CBC  Component Value Date/Time   WBC 4.6 02/02/2022 1448   RBC 4.19 02/02/2022 1448   HGB 10.7 (L) 02/02/2022 1448   HGB 11.2 12/18/2021 1707   HCT 34.2 (L) 02/02/2022 1448   HCT 34.0 12/18/2021 1707   PLT 286 02/02/2022 1448   PLT 154 12/18/2021 1707   MCV 81.6 02/02/2022 1448   MCV 80 12/18/2021 1707   MCH 25.5 (L) 02/02/2022 1448   MCHC 31.3 02/02/2022 1448   RDW 16.2 (H) 02/02/2022 1448   RDW 14.4 12/18/2021 1707   LYMPHSABS 1.2 02/02/2022 1448   LYMPHSABS 1.4 12/18/2021 1707   MONOABS 0.5 02/02/2022 1448   EOSABS 0.1 02/02/2022 1448   EOSABS 0.1 12/18/2021 1707   BASOSABS 0.0 02/02/2022 1448   BASOSABS 0.0 12/18/2021 1707    ASSESSMENT AND PLAN:  1. DUB (dysfunctional uterine bleeding) We will get a pelvic ultrasound and some baseline blood tests including hormone levels and CBC.  We will try her with 1 month of birth control pills to see if this would stop the bleeding in the meantime.  Denies any history of blood clots in the legs on the lungs.  No history of liver disease.  Referral to gynecology. - US Pelvic Complete With Transvaginal; Future - CBC - TSH+T4F+T3Free - Prolactin - Follicle stimulating hormone - Luteinizing hormone - Norethindrone Acetate-Ethinyl Estradiol (LOESTRIN) 1.5-30 MG-MCG tablet; Take 1 tablet by mouth daily.  Dispense: 28 tablet; Refill: 0  2. Mediastinal mass - MR CHEST W WO CONTRAST; Future    Patient was given the opportunity to ask questions.  Patient verbalized understanding of the plan and was able to repeat key  elements of the plan.   This documentation was completed using Radio producer.  Any transcriptional errors are unintentional.  No orders of the defined types were placed in this encounter.    Requested Prescriptions    No prescriptions requested or ordered in this encounter    No follow-ups on file.  Karle Plumber, MD, FACP

## 2022-11-22 LAB — CBC
Hematocrit: 40.7 % (ref 34.0–46.6)
Hemoglobin: 12.4 g/dL (ref 11.1–15.9)
MCH: 25.1 pg — ABNORMAL LOW (ref 26.6–33.0)
MCHC: 30.5 g/dL — ABNORMAL LOW (ref 31.5–35.7)
MCV: 82 fL (ref 79–97)
Platelets: 283 10*3/uL (ref 150–450)
RBC: 4.95 x10E6/uL (ref 3.77–5.28)
RDW: 14.6 % (ref 11.7–15.4)
WBC: 3.9 10*3/uL (ref 3.4–10.8)

## 2022-11-22 LAB — TSH+T4F+T3FREE
Free T4: 1.15 ng/dL (ref 0.82–1.77)
T3, Free: 3.4 pg/mL (ref 2.0–4.4)
TSH: 0.479 u[IU]/mL (ref 0.450–4.500)

## 2022-11-22 LAB — PROLACTIN: Prolactin: 4.9 ng/mL (ref 4.8–33.4)

## 2022-11-22 LAB — FOLLICLE STIMULATING HORMONE: FSH: 3.6 m[IU]/mL

## 2022-11-22 LAB — LUTEINIZING HORMONE: LH: 15.4 m[IU]/mL

## 2022-11-27 ENCOUNTER — Other Ambulatory Visit: Payer: Self-pay

## 2022-11-27 ENCOUNTER — Encounter: Payer: Self-pay | Admitting: Family Medicine

## 2022-11-27 ENCOUNTER — Ambulatory Visit (INDEPENDENT_AMBULATORY_CARE_PROVIDER_SITE_OTHER): Payer: Medicaid Other | Admitting: Family Medicine

## 2022-11-27 VITALS — BP 136/83 | HR 98 | Ht 62.0 in | Wt 173.0 lb

## 2022-11-27 DIAGNOSIS — Z3009 Encounter for other general counseling and advice on contraception: Secondary | ICD-10-CM | POA: Diagnosis not present

## 2022-11-27 DIAGNOSIS — Z30430 Encounter for insertion of intrauterine contraceptive device: Secondary | ICD-10-CM | POA: Diagnosis not present

## 2022-11-27 MED ORDER — LEVONORGESTREL 20.1 MCG/DAY IU IUD
1.0000 | INTRAUTERINE_SYSTEM | Freq: Once | INTRAUTERINE | Status: AC
  Administered 2022-11-27: 1 via INTRAUTERINE

## 2022-11-27 NOTE — Progress Notes (Signed)
   GYNECOLOGY PROBLEM  VISIT ENCOUNTER NOTE  Subjective:   Alexis Conley is a 22 y.o. G66P1001 female here for a problem GYN visit.  Current complaints: vaginal bleeding. Hx of irregular cycles with extensive w/u prior to pregnancy. Since pregnancy she has had regular cycles, baby is nearly one and reports sudden onset irregular and heavy periods. She reports PCP gave her OCP but wants to discuss with [place].  She is not sexually active.    Denies abnormal vaginal bleeding, discharge, pelvic pain, problems with intercourse or other gynecologic concerns.    Gynecologic History Patient's last menstrual period was 10/20/2022 (exact date).  Contraception: none  Health Maintenance Due  Topic Date Due   DTaP/Tdap/Td (1 - Tdap) Never done    The following portions of the patient's history were reviewed and updated as appropriate: allergies, current medications, past family history, past medical history, past social history, past surgical history and problem list.  Review of Systems Pertinent items are noted in HPI.   Objective:  BP 136/83   Pulse 98   Ht '5\' 2"'$  (1.575 m)   Wt 173 lb (78.5 kg)   LMP 10/20/2022 (Exact Date)   Breastfeeding No   BMI 31.64 kg/m  Gen: well appearing, NAD HEENT: no scleral icterus CV: RR Lung: Normal WOB Ext: warm well perfused  PELVIC: Normal appearing external genitalia; normal appearing vaginal mucosa and cervix.  No abnormal discharge noted.  Normal uterine size, no other palpable masses, no uterine or adnexal tenderness.  IUD Insertion Procedure Note Patient identified, informed consent performed, consent signed.   Discussed risks of irregular bleeding, cramping, infection, malpositioning or misplacement of the IUD outside the uterus which may require further procedure such as laparoscopy. Time out was performed.  Urine pregnancy test negative.  Speculum placed in the vagina.  Cervix visualized.  Cleaned with Betadine x 2.  Grasped anteriorly with a  single tooth tenaculum.  Uterus sounded to 8 cm.  IUD placed per manufacturer's recommendations.  Strings trimmed to 3 cm. Tenaculum was removed, good hemostasis noted.  Patient tolerated procedure well.    Assessment and Plan:  1. Encounter for other general counseling or advice on contraception Reviewed options in patient centered way Reviewed IUD association with amenorrhea and lighter periods  2. Encounter for IUD insertion Patient was given post-procedure instructions.  She was advised to have backup contraception for one week.  Patient was also asked to check IUD strings periodically and follow up in 4 weeks for IUD check.  Please refer to After Visit Summary for other counseling recommendations.   Return in about 4 weeks (around 12/25/2022) for IUD string check PRN if cannot feel strings.  Caren Macadam, MD, MPH, ABFM Attending [place] for [facility]

## 2022-11-27 NOTE — Progress Notes (Signed)
Jan 28- starting bleeding until today. Cramping, heavier bleeding that started yesterday. Changing pad/tampon 4 times a day, but since yesterday with heavier bleeding every 2-3 hours with quarter size clots. Feels tired. Saw PCP for labs on 11/21/22. Thinking about doing IUD today. Colletta Maryland, RNC

## 2022-12-23 ENCOUNTER — Other Ambulatory Visit: Payer: Medicaid Other

## 2023-01-08 ENCOUNTER — Encounter: Payer: Self-pay | Admitting: Family Medicine

## 2023-01-08 DIAGNOSIS — Z975 Presence of (intrauterine) contraceptive device: Secondary | ICD-10-CM

## 2023-01-09 MED ORDER — NORGESTIMATE-ETH ESTRADIOL 0.25-35 MG-MCG PO TABS: 1.0000 | ORAL_TABLET | Freq: Every day | ORAL | 1 refills | Status: DC

## 2023-01-15 ENCOUNTER — Other Ambulatory Visit: Payer: Self-pay | Admitting: Lactation Services

## 2023-01-15 DIAGNOSIS — Z975 Presence of (intrauterine) contraceptive device: Secondary | ICD-10-CM

## 2023-01-15 MED ORDER — NORGESTIMATE-ETH ESTRADIOL 0.25-35 MG-MCG PO TABS
1.0000 | ORAL_TABLET | Freq: Every day | ORAL | 3 refills | Status: DC
Start: 2023-01-15 — End: 2023-02-06

## 2023-01-15 NOTE — Progress Notes (Signed)
Ordered 90 day supply at patients request. 

## 2023-01-27 IMAGING — CT CT ANGIO CHEST
2 of 6 series · 17 of 36 positions shown · IV contrast (agent unspecified)
Comparison: Portable chest yesterday, PA chest 05/02/2020.

CLINICAL DATA: Five days postpartum with worsening shortness of
breath. Abnormal chest x-ray.

EXAM:
CT ANGIOGRAPHY CHEST WITH CONTRAST
TECHNIQUE: Multidetector CT imaging of the chest was performed using the
standard protocol during bolus administration of intravenous
contrast. Multiplanar CT image reconstructions and MIPs were
obtained to evaluate the vascular anatomy.

[Series 7: pe thins · axial · 0.73mm/px · z∈[+1102,+1329]mm · 16 of 361 slices shown]
[im 19/361  lung]
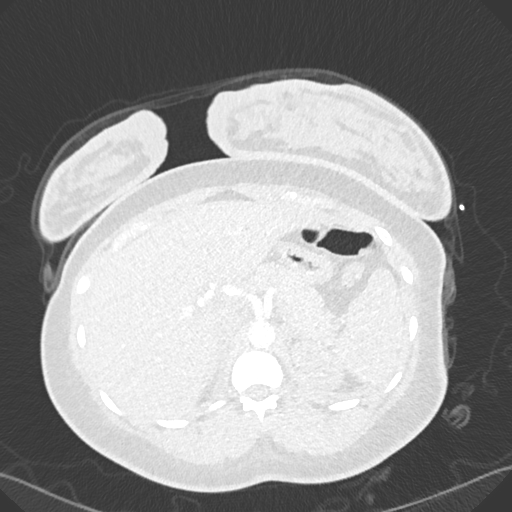
[im 37/361  mediastinal]
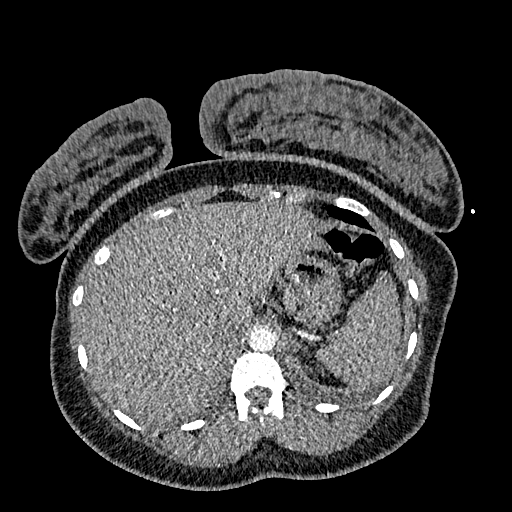
[im 55/361  lung]
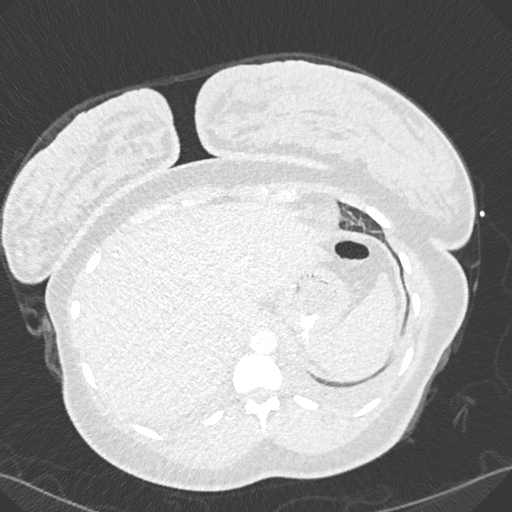
[im 91/361  mediastinal]
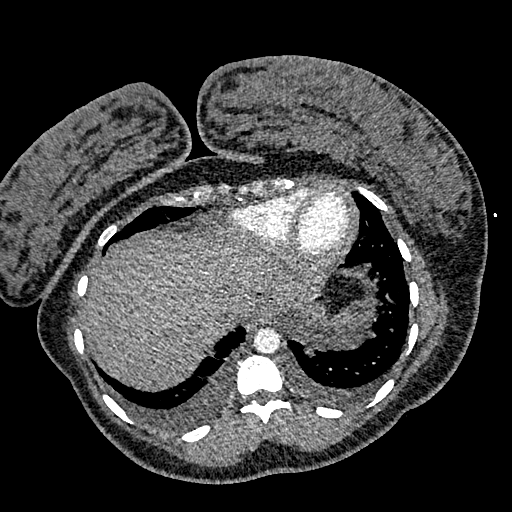
[im 109/361  lung]
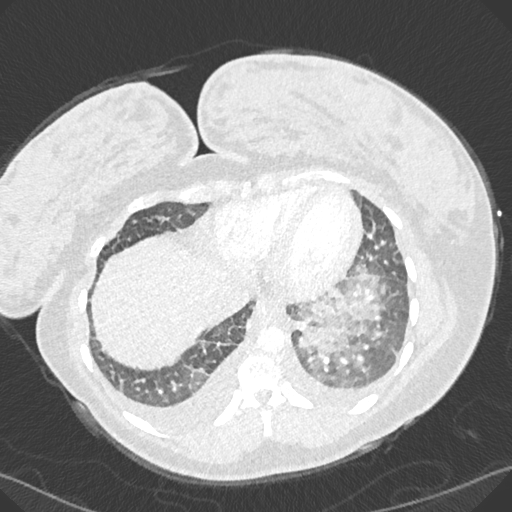
[im 127/361  mediastinal]
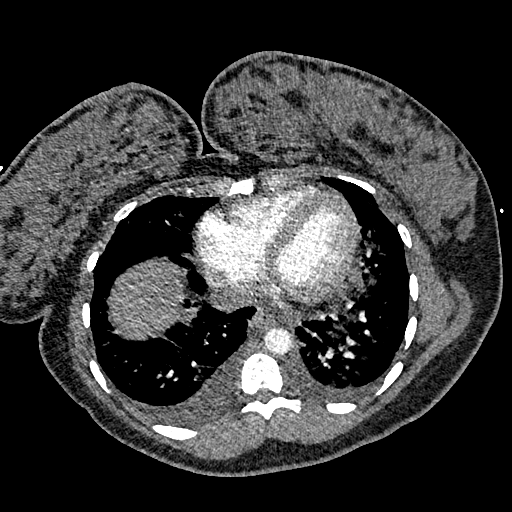
[im 145/361  lung]
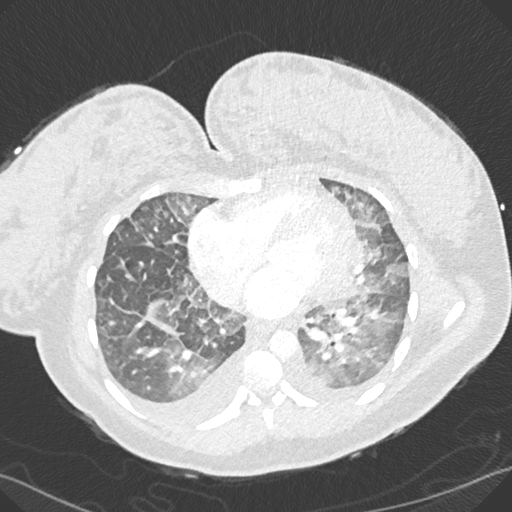
[im 163/361  mediastinal]
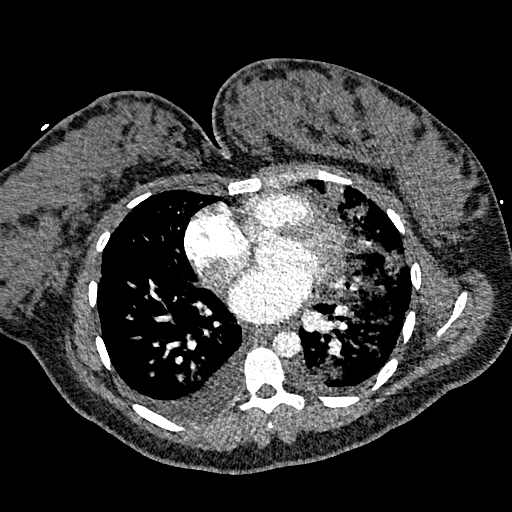
[im 199/361  lung]
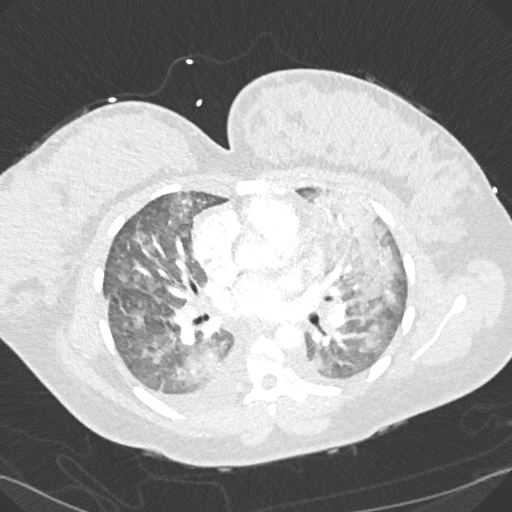
[im 217/361  mediastinal]
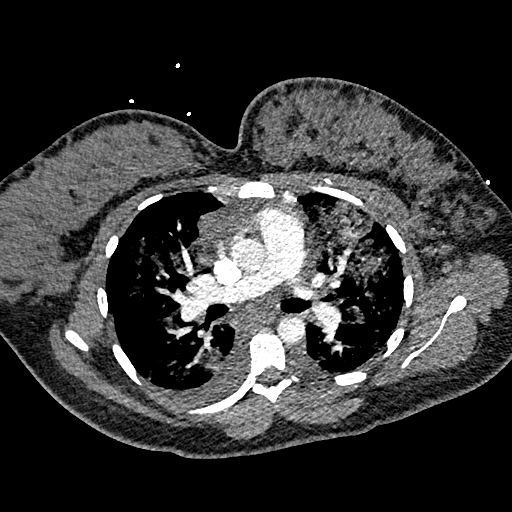
[im 235/361  lung]
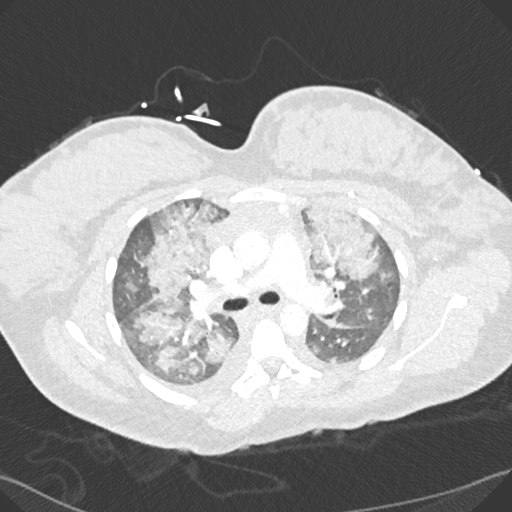
[im 253/361  mediastinal]
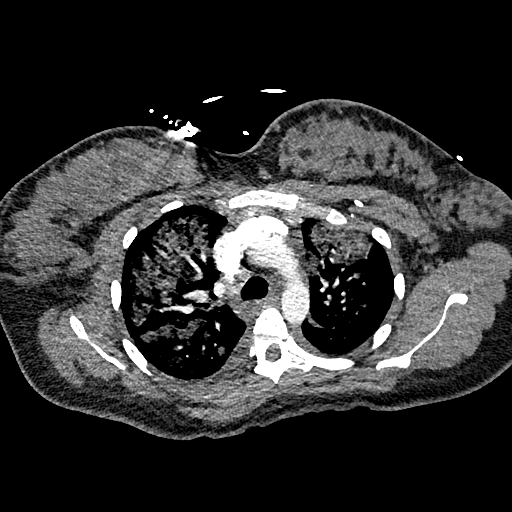
[im 271/361  lung]
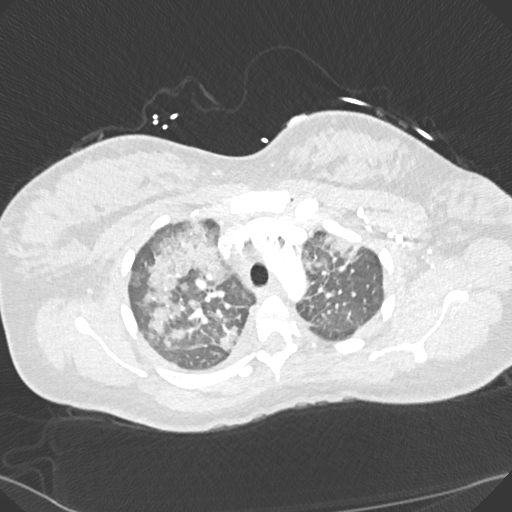
[im 307/361  mediastinal]
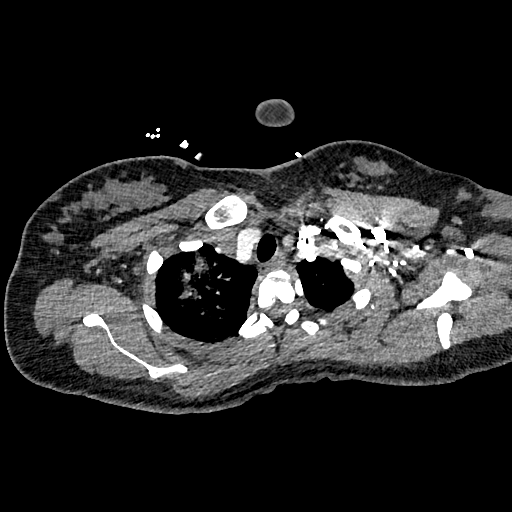
[im 325/361  lung]
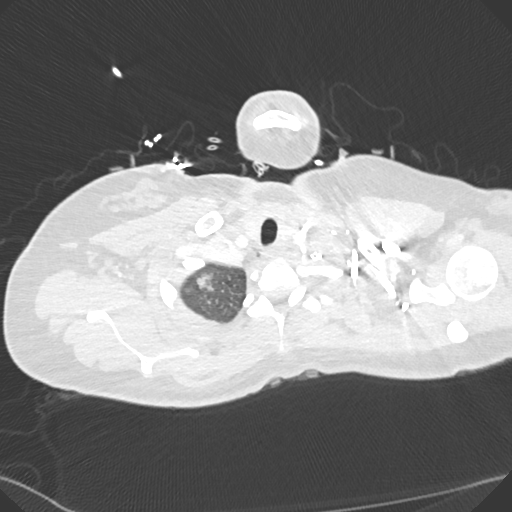
[im 343/361  mediastinal]
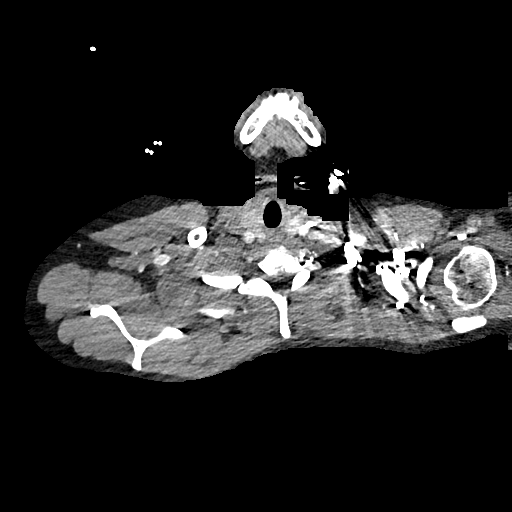

[Series 8: pe 2mm cor · coronal · 0.51mm/px · 1 of 137 slices shown]
[im 69/137  mediastinal]
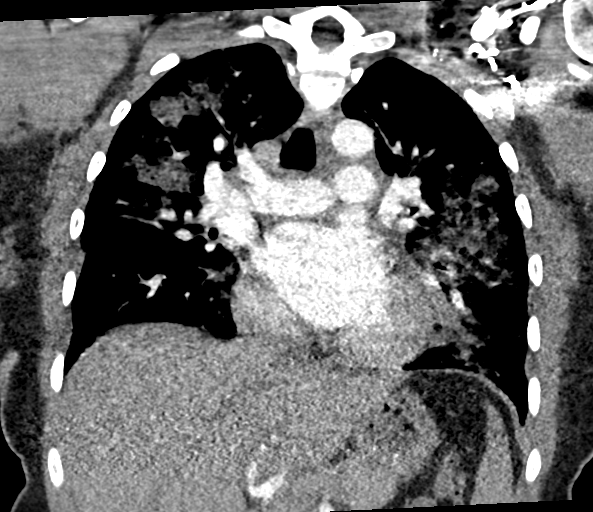

[17 of 36 positions shown; findings below may reference images not displayed]

RADIATION DOSE REDUCTION: This exam was performed according to the
departmental dose-optimization program which includes automated
exposure control, adjustment of the mA and/or kV according to
patient size and/or use of iterative reconstruction technique.

CONTRAST:  60mL OMNIPAQUE IOHEXOL 350 MG/ML SOLN
FINDINGS: Cardiovascular: The heart is slightly enlarged. There is no
pericardial effusion. The aorta and great vessels are unremarkable.
The pulmonary veins are decompressed. Pulmonary arteries are normal
in caliber without appreciable thromboemboli.

Mediastinum/Nodes: There is a mildly prominent left-sided
prevascular lymph node up to 1.1 cm in short axis. There is
ill-defined soft tissue eccentric to the right in the anterior
mediastinal prevascular space, on [DATE] measuring 6 x 2.3 cm and
extending over several slices, approximately 2.7 cm craniocaudal.
This could be additional mediastinal adenopathy or atypical
appearance of residual or rebounded thymus given the patient's young
age.

There is no hilar or axillary adenopathy. The lower poles of the
thyroid are unremarkable. The trachea is clear. There is a 7 mm
right posterolateral tracheal diverticulum at the level of T1-2.
There is no significant thickening of the esophagus.

Lungs/Pleura: There are small layering pleural effusions, slightly
greater fluid on the right. There is no pleural thickening or
pneumothorax.

There is subpleural interstitial edema in the lung fields extending
inferiorly from the aortic arch level, more pronounced in the bases.

There are widespread dense patchy alveolar opacities of the lung
fields, predominating in the upper lobes with a mildly less dense
appearance in the lower and right middle lobes.

Portions of the peripheral lungs are relatively but not completely
spared from the process. The central airways are patent.

Upper Abdomen: No acute abnormality.

Musculoskeletal: No chest wall abnormality, no acute or significant
osseous findings.

Review of the MIP images confirms the above findings.
IMPRESSION: 1. Slight cardiomegaly without significant venous distention. No
pulmonary arterial dilatation or visible embolus.
2. 6.0 x 2.3 x 2.7 cm anterior mediastinal mass versus atypical
residual or rebound thymus. Further evaluation recommended. PET-CT
may be helpful.
3. Generalized interstitial edema with a basal gradient with diffuse
upper-zone-predominant patchy to confluent dense alveolar
consolidations. This could all be due to interstitial and alveolar
pulmonary edema whether noncardiogenic or due to ARDS, or could be
due to a combination of pulmonary edema and pneumonia. Pulmonary
hemorrhage and bronchoalveolar carcinoma could produce similar
opacities but would not typically be this extensive. Clinical
correlation and short interval follow-up CT recommended.
4. There are small layering pleural effusions
right-greater-than-left.

## 2023-02-03 ENCOUNTER — Other Ambulatory Visit: Payer: Self-pay | Admitting: Family Medicine

## 2023-02-03 DIAGNOSIS — N921 Excessive and frequent menstruation with irregular cycle: Secondary | ICD-10-CM

## 2023-02-06 ENCOUNTER — Encounter: Payer: Self-pay | Admitting: Family Medicine

## 2023-02-06 ENCOUNTER — Encounter: Payer: Medicaid Other | Admitting: Family Medicine

## 2023-02-06 ENCOUNTER — Ambulatory Visit (INDEPENDENT_AMBULATORY_CARE_PROVIDER_SITE_OTHER): Payer: Medicaid Other | Admitting: Family Medicine

## 2023-02-06 VITALS — BP 130/85 | HR 90 | Ht 62.0 in | Wt 176.0 lb

## 2023-02-06 DIAGNOSIS — Z30432 Encounter for removal of intrauterine contraceptive device: Secondary | ICD-10-CM

## 2023-02-06 DIAGNOSIS — N939 Abnormal uterine and vaginal bleeding, unspecified: Secondary | ICD-10-CM | POA: Diagnosis not present

## 2023-02-06 NOTE — Progress Notes (Signed)
    GYNECOLOGY OFFICE PROCEDURE NOTE  Alexis Conley is a 22 y.o. G1P1001 here for [place] IUD removal.      Component Value Date/Time   DIAGPAP (A) 07/04/2021 1357    - Atypical squamous cells of undetermined significance (ASC-US)   HPVHIGH Negative 07/04/2021 1357   ADEQPAP  07/04/2021 1357    Satisfactory for evaluation; transformation zone component PRESENT.    IUD Removal  Patient identified, informed consent performed, consent signed.  Patient was in the dorsal lithotomy position, normal external genitalia was noted.  A speculum was placed in the patient's vagina, normal discharge was noted, no lesions. The cervix was visualized, no lesions, no abnormal discharge.  The strings of the IUD were grasped and pulled using ring forceps. The IUD was removed in its entirety. Patient tolerated the procedure well.    Patient will use condoms for contraception.   Venora Maples, MD/MPH Attending Family Medicine Physician, [facility] for [facility], [facility]

## 2023-02-06 NOTE — Assessment & Plan Note (Signed)
Unusual persistent bleeding despite presence of IUD. Recommended [place] with IUD in place but prefers removal today, will send for pelvic US.

## 2023-02-06 NOTE — Progress Notes (Signed)
   [facility] GYNECOLOGY OFFICE VISIT NOTE  History:   Alexis Conley is a 22 y.o. G1P1001 here today for IUD removal.  Actually likes the method but has not stopped bleeding for five months Wants to get an [place] and see what is going on Wondering if she has fibroids Planning to use condoms for now  Health Maintenance Due  Topic Date Due   DTaP/Tdap/Td (1 - Tdap) Never done    Past Medical History:  Diagnosis Date   Eczema    Heart murmur    When younger   TB lung, latent     Past Surgical History:  Procedure Laterality Date   NO PAST SURGERIES      The following portions of the patient's history were reviewed and updated as appropriate: allergies, current medications, past family history, past medical history, past social history, past surgical history and problem list.   Health Maintenance:   Last pap: Lab Results  Component Value Date   DIAGPAP (A) 07/04/2021    - Atypical squamous cells of undetermined significance (ASC-US)   HPVHIGH Negative 07/04/2021    Last mammogram:  N/a    Review of Systems:  Pertinent items noted in HPI and remainder of comprehensive ROS otherwise negative.  Physical Exam:  BP 130/85   Pulse 90   Ht 5\' 2"  (1.575 m)   Wt 176 lb (79.8 kg)   LMP 02/01/2023 (Exact Date)   Breastfeeding No   BMI 32.19 kg/m  CONSTITUTIONAL: Well-developed, well-nourished female in no acute distress.  HEENT:  Normocephalic, atraumatic. External right and left ear normal. No scleral icterus.  NECK: Normal range of motion, supple, no masses noted on observation SKIN: No rash noted. Not diaphoretic. No erythema. No pallor. MUSCULOSKELETAL: Normal range of motion. No edema noted. NEUROLOGIC: Alert and oriented to person, place, and time. Normal muscle tone coordination.  PSYCHIATRIC: Normal mood and affect. Normal behavior. Normal judgment and thought content. RESPIRATORY: Effort normal, no problems with respiration noted ABDOMEN: No masses  noted. No other overt distention noted.   PELVIC:  normal external genitalia. Vagina normal . Cervix normal, IUD strings present, removed without problem.   Labs and Imaging No results found for this or any previous visit (from the past 168 hour(s)). No results found.    Assessment and Plan:   Problem List Items Addressed This Visit       Genitourinary   Abnormal uterine bleeding (AUB) - Primary    Unusual persistent bleeding despite presence of IUD. Recommended [place] with IUD in place but prefers removal today, will send for pelvic US.      Relevant Orders   US PELVIC COMPLETE WITH TRANSVAGINAL   Other Visit Diagnoses     Encounter for IUD removal           Routine preventative health maintenance measures emphasized. Please refer to After Visit Summary for other counseling recommendations.   Return if symptoms worsen or fail to improve.    Total face-to-face time with patient: 15 minutes.  Over 50% of encounter was spent on counseling and coordination of care.   Venora Maples, MD/MPH Attending Family Medicine Physician, [facility] for [facility], [facility]

## 2023-02-10 ENCOUNTER — Ambulatory Visit (HOSPITAL_BASED_OUTPATIENT_CLINIC_OR_DEPARTMENT_OTHER)
Admission: RE | Admit: 2023-02-10 | Discharge: 2023-02-10 | Disposition: A | Discharge status: Home / Self Care | Payer: Medicaid Other | Source: Ambulatory Visit | Attending: Family Medicine | Admitting: Family Medicine

## 2023-02-10 DIAGNOSIS — N939 Abnormal uterine and vaginal bleeding, unspecified: Secondary | ICD-10-CM | POA: Diagnosis not present

## 2023-02-18 ENCOUNTER — Encounter: Payer: Medicaid Other | Admitting: Family Medicine

## 2023-02-19 ENCOUNTER — Encounter: Payer: Self-pay | Admitting: Family Medicine

## 2023-02-20 ENCOUNTER — Ambulatory Visit (INDEPENDENT_AMBULATORY_CARE_PROVIDER_SITE_OTHER): Payer: Medicaid Other | Admitting: *Deleted

## 2023-02-20 ENCOUNTER — Other Ambulatory Visit: Payer: Self-pay

## 2023-02-20 ENCOUNTER — Other Ambulatory Visit (HOSPITAL_COMMUNITY)
Admission: RE | Admit: 2023-02-20 | Discharge: 2023-02-20 | Disposition: A | Discharge status: Home / Self Care | Payer: Medicaid Other | Source: Ambulatory Visit | Attending: Family Medicine | Admitting: Family Medicine

## 2023-02-20 VITALS — BP 134/62 | HR 85 | Ht 62.0 in | Wt 170.9 lb

## 2023-02-20 DIAGNOSIS — Z202 Contact with and (suspected) exposure to infections with a predominantly sexual mode of transmission: Secondary | ICD-10-CM

## 2023-02-20 DIAGNOSIS — N898 Other specified noninflammatory disorders of vagina: Secondary | ICD-10-CM | POA: Diagnosis not present

## 2023-02-20 NOTE — Progress Notes (Signed)
Here for self swab because she is having mucousy discharge and some itching. Would like full wet prep as she is sexually active and wants to be sure all is well. Self swab obtained. Explained will be contacted if any results positive and needs treatment. She voices understanding. Nancy Fetter

## 2023-02-21 ENCOUNTER — Other Ambulatory Visit: Payer: Self-pay | Admitting: Obstetrics and Gynecology

## 2023-02-21 DIAGNOSIS — A749 Chlamydial infection, unspecified: Secondary | ICD-10-CM

## 2023-02-21 DIAGNOSIS — A599 Trichomoniasis, unspecified: Secondary | ICD-10-CM

## 2023-02-21 DIAGNOSIS — N760 Acute vaginitis: Secondary | ICD-10-CM

## 2023-02-21 LAB — CERVICOVAGINAL ANCILLARY ONLY
Bacterial Vaginitis (gardnerella): POSITIVE — AB
Candida Glabrata: NEGATIVE
Candida Vaginitis: NEGATIVE
Chlamydia: POSITIVE — AB
Comment: NEGATIVE
Comment: NEGATIVE
Comment: NEGATIVE
Comment: NEGATIVE
Comment: NEGATIVE
Comment: NORMAL
Neisseria Gonorrhea: NEGATIVE
Trichomonas: POSITIVE — AB

## 2023-02-21 MED ORDER — DOXYCYCLINE HYCLATE 100 MG PO CAPS
100.0000 mg | ORAL_CAPSULE | Freq: Two times a day (BID) | ORAL | 1 refills | Status: AC
Start: 2023-02-21 — End: 2023-02-28

## 2023-02-21 MED ORDER — METRONIDAZOLE 500 MG PO TABS
500.0000 mg | ORAL_TABLET | Freq: Two times a day (BID) | ORAL | 0 refills | Status: AC
Start: 2023-02-21 — End: 2023-02-28

## 2023-02-21 MED ORDER — FLUCONAZOLE 150 MG PO TABS
150.0000 mg | ORAL_TABLET | Freq: Once | ORAL | 0 refills | Status: AC
Start: 2023-02-21 — End: 2023-02-21

## 2023-02-24 ENCOUNTER — Telehealth: Payer: Self-pay

## 2023-02-24 ENCOUNTER — Other Ambulatory Visit: Payer: Self-pay

## 2023-02-24 MED ORDER — MILI 0.25-35 MG-MCG PO TABS: 1.0000 | ORAL_TABLET | Freq: Every day | ORAL | 11 refills | Status: DC

## 2023-02-24 NOTE — Telephone Encounter (Signed)
Received fax requesting refill for mili tabs 28s.   Maureen Ralphs RN on 02/24/23 at [phone]

## 2023-02-24 NOTE — Telephone Encounter (Addendum)
-----   Message from Lorriane Shire, MD sent at 02/21/2023  3:57 PM EDT ----- Notify pt of chlamydia and trichomonas as well as BV. RX's sent. Advised of need for partner TX and no unprotected sex for 7 days after treatment completed.  Patient has read provider message. Communicable disease report faxed to [facility].

## 2023-03-03 ENCOUNTER — Telehealth: Payer: Self-pay | Admitting: Family Medicine

## 2023-03-03 MED ORDER — FLUCONAZOLE 150 MG PO TABS: 150.0000 mg | ORAL_TABLET | Freq: Once | ORAL | 0 refills | Status: AC

## 2023-03-03 NOTE — Telephone Encounter (Signed)
Returned call to pt. Spoke with pt. Pt states having burning/itching when peeing x 2 days since having treatment for Chlamydia and trich from 02/21/23. Finished meds on 6/8. Reviewed call with Dr Alvester Morin. Orders given for Rx Diflucan once today. Rx sent to pharmacy on file.  Advised to let us know if symptoms don't resolve by Friday. Pt verbalized understanding and agreeable to plan of care.  Judeth Cornfield, RNC

## 2023-03-03 NOTE — Telephone Encounter (Signed)
Patient was taking medication to treat chlamydia but is still having symptoms, would like a call back from nurse

## 2023-03-06 ENCOUNTER — Other Ambulatory Visit: Payer: Self-pay

## 2023-03-06 ENCOUNTER — Ambulatory Visit (INDEPENDENT_AMBULATORY_CARE_PROVIDER_SITE_OTHER): Payer: Medicaid Other | Admitting: *Deleted

## 2023-03-06 VITALS — BP 132/81 | HR 74 | Ht 62.0 in | Wt 172.1 lb

## 2023-03-06 DIAGNOSIS — N898 Other specified noninflammatory disorders of vagina: Secondary | ICD-10-CM

## 2023-03-06 MED ORDER — FLUCONAZOLE 150 MG PO TABS: 150.0000 mg | ORAL_TABLET | Freq: Once | ORAL | 0 refills | Status: AC

## 2023-03-06 NOTE — Progress Notes (Signed)
Pt reports receiving Rx to treat presumed vaginal yeast on 6/10 due to vaginal irritation. She recently completed treatment for CT, Trich and BV. Pt reports she is still having vaginal burning and irritation. She denies vaginal discharge. Per consult with Dr. Crissie Reese, Rx for 2nd Diflucan sent to pharmacy. Pt was offered to perform self swab to check for yeast or BV and she declined. Pt was instructed to notify our office if her sx continue or worsen past 3 days from now. She voiced understanding.

## 2023-03-25 ENCOUNTER — Emergency Department (HOSPITAL_BASED_OUTPATIENT_CLINIC_OR_DEPARTMENT_OTHER): Payer: Medicaid Other

## 2023-03-25 ENCOUNTER — Emergency Department (HOSPITAL_BASED_OUTPATIENT_CLINIC_OR_DEPARTMENT_OTHER)
Admission: EM | Admit: 2023-03-25 | Discharge: 2023-03-25 | Disposition: A | Discharge status: Home / Self Care | Payer: Medicaid Other | Attending: Emergency Medicine | Admitting: Emergency Medicine

## 2023-03-25 ENCOUNTER — Other Ambulatory Visit: Payer: Self-pay

## 2023-03-25 ENCOUNTER — Emergency Department (HOSPITAL_BASED_OUTPATIENT_CLINIC_OR_DEPARTMENT_OTHER): Payer: Medicaid Other | Admitting: Radiology

## 2023-03-25 ENCOUNTER — Ambulatory Visit: Payer: Self-pay

## 2023-03-25 ENCOUNTER — Encounter (HOSPITAL_BASED_OUTPATIENT_CLINIC_OR_DEPARTMENT_OTHER): Payer: Self-pay | Admitting: Emergency Medicine

## 2023-03-25 DIAGNOSIS — R079 Chest pain, unspecified: Secondary | ICD-10-CM

## 2023-03-25 DIAGNOSIS — R519 Headache, unspecified: Secondary | ICD-10-CM | POA: Diagnosis not present

## 2023-03-25 DIAGNOSIS — R42 Dizziness and giddiness: Secondary | ICD-10-CM | POA: Diagnosis not present

## 2023-03-25 DIAGNOSIS — Z91010 Allergy to peanuts: Secondary | ICD-10-CM | POA: Diagnosis not present

## 2023-03-25 DIAGNOSIS — R072 Precordial pain: Secondary | ICD-10-CM | POA: Diagnosis not present

## 2023-03-25 DIAGNOSIS — R0789 Other chest pain: Secondary | ICD-10-CM | POA: Insufficient documentation

## 2023-03-25 HISTORY — DX: Heart failure, unspecified: I50.9

## 2023-03-25 LAB — BASIC METABOLIC PANEL
Anion gap: 7 (ref 5–15)
BUN: 13 mg/dL (ref 6–20)
CO2: 25 mmol/L (ref 22–32)
Calcium: 9.7 mg/dL (ref 8.9–10.3)
Chloride: 105 mmol/L (ref 98–111)
Creatinine, Ser: 0.9 mg/dL (ref 0.44–1.00)
GFR, Estimated: >60 mL/min (ref >60)
Glucose, Bld: 86 mg/dL (ref 70–99)
Potassium: 3.8 mmol/L (ref 3.5–5.1)
Sodium: 137 mmol/L (ref 135–145)

## 2023-03-25 LAB — CBC
HCT: 40.5 % (ref 36.0–46.0)
Hemoglobin: 13 g/dL (ref 12.0–15.0)
MCH: 25.5 pg — ABNORMAL LOW (ref 26.0–34.0)
MCHC: 32.1 g/dL (ref 30.0–36.0)
MCV: 79.6 fL — ABNORMAL LOW (ref 80.0–100.0)
Platelets: 206 10*3/uL (ref 150–400)
RBC: 5.09 MIL/uL (ref 3.87–5.11)
RDW: 14.7 % (ref 11.5–15.5)
WBC: 4.6 10*3/uL (ref 4.0–10.5)
nRBC: 0 % (ref 0.0–0.2)

## 2023-03-25 NOTE — ED Notes (Signed)
ED Provider at bedside. 

## 2023-03-25 NOTE — Telephone Encounter (Signed)
Spoke with patient . Verified name & DOB  Advised patient that there are not open appointment on the office with any provider. Directed patient to our [facility]'s location. Patient voiced that she would go there today

## 2023-03-25 NOTE — ED Provider Notes (Signed)
EMERGENCY DEPARTMENT AT [facility] Provider Note   CSN: [id number] Arrival date & time: 03/25/23  8295     History  Chief Complaint  Patient presents with   Headache    Alexis Conley is a 22 y.o. female.   Headache Patient has had headaches on and off for the last 5 days.  Frontal.  Come and go.  Feels fine in between.  States does not usually get headaches however.  No chills.  States she may get fevers at times.  Does have a little dizziness bending over.  Also has a chest pain that comes and goes for around 3 days.  States that sort of matches up with the headache.  When the head is hurt the chest is hurting.  No coughing.  No trouble breathing.  No nausea or vomiting.  Denies pregnancy and states she had a negative pregnancy test at home.     Home Medications Prior to Admission medications   Medication Sig Start Date End Date Taking? Authorizing Provider  acetaminophen (TYLENOL) 325 MG tablet Take 2 tablets (650 mg total) by mouth every 4 (four) hours as needed (for pain scale < 4). Patient not taking: Reported on 03/06/2023 01/05/22   Myna Hidalgo, DO  MILI 0.25-35 MG-MCG tablet Take 1 tablet by mouth daily. Patient not taking: Reported on 03/06/2023 02/24/23   Venora Maples, MD  fluticasone [facility]) 50 MCG/ACT nasal spray Place 1-2 sprays into both nostrils daily. 10/05/20 02/22/21  Wieters, Hallie C, PA-C      Allergies    Peanut-containing drug products, Shellfish allergy, Tomato, and Mushroom extract complex    Review of Systems   Review of Systems  Neurological:  Positive for headaches.    Physical Exam Updated Vital Signs BP 119/71 (BP Location: Right Arm)   Pulse 66   Temp 98.2 F (36.8 C)   Resp 17   Ht 5\' 2"  (1.575 m)   Wt 76.7 kg   LMP 03/17/2023   SpO2 100%   BMI 30.91 kg/m  Physical Exam Vitals reviewed.  Eyes:     Pupils: Pupils are equal, round, and reactive to light.  Cardiovascular:     Rate and Rhythm: Regular  rhythm.  Chest:     Chest wall: No tenderness.  Abdominal:     Palpations: Abdomen is soft.     Tenderness: There is no abdominal tenderness.  Musculoskeletal:     Cervical back: Neck supple. No rigidity.  Skin:    General: Skin is warm.  Neurological:     Mental Status: She is alert.     ED Results / Procedures / Treatments   Labs (all labs ordered are listed, but only abnormal results are displayed) Labs Reviewed  CBC - Abnormal; Notable for the following components:      Result Value   MCV 79.6 (*)    MCH 25.5 (*)    All other components within normal limits  BASIC METABOLIC PANEL    EKG EKG Interpretation Date/Time:  Tuesday March 25 2023 08:20:05 EDT Ventricular Rate:  87 PR Interval:  122 QRS Duration:  70 QT Interval:  344 QTC Calculation: 413 R Axis:   43  Text Interpretation: Normal sinus rhythm Normal ECG When compared with ECG of 08-Jan-2022 23:24, T wave inversion now evident in Inferior leads T wave amplitude has decreased in Lateral leads Confirmed by Benjiman Core [phone]) on 03/25/2023 9:09:44 AM  Radiology CT Head Wo Contrast  Result Date: 03/25/2023 CLINICAL  DATA:  Headache for 5 days. EXAM: CT HEAD WITHOUT CONTRAST TECHNIQUE: Contiguous axial images were obtained from the base of the skull through the vertex without intravenous contrast. RADIATION DOSE REDUCTION: This exam was performed according to the departmental dose-optimization program which includes automated exposure control, adjustment of the mA and/or kV according to patient size and/or use of iterative reconstruction technique. COMPARISON:  None Available. FINDINGS: Brain: There is no acute intracranial hemorrhage, extra-axial fluid collection, or acute infarct Parenchymal volume is normal. The ventricles are normal in size. Gray-white differentiation is preserved. The pituitary and suprasellar region are normal. There is no mass lesion. There is no mass effect or midline shift. Vascular: No  hyperdense vessel or unexpected calcification. Skull: Normal. Negative for fracture or focal lesion. Sinuses/Orbits: The imaged paranasal sinuses are clear. The imaged globes and orbits are unremarkable. Other: The mastoid air cells and middle ear cavities are clear. IMPRESSION: Normal head CT. Electronically Signed   By: Lesia Hausen M.D.   On: 03/25/2023 09:03   DG Chest 2 View  Result Date: 03/25/2023 CLINICAL DATA:  Mid-sternal chest pain EXAM: CHEST - 2 VIEW COMPARISON:  Chest x-ray January 08, 2022 FINDINGS: The heart size and mediastinal contours are within normal limits. No focal pulmonary opacity. No pleural effusion or pneumothorax. The visualized upper abdomen is unremarkable. No acute osseous abnormality. IMPRESSION: No acute cardiopulmonary abnormality. Electronically Signed   By: Jacob Moores M.D.   On: 03/25/2023 08:58    Procedures Procedures    Medications Ordered in ED Medications - No data to display  ED Course/ Medical Decision Making/ A&P                             Medical Decision Making Amount and/or Complexity of Data Reviewed Labs: ordered. Radiology: ordered.   Patient with headache and chest pain.  Headache is unusual for her.  Differential diagnosis includes nonspecific headache, meningitis, intracranial hemorrhage.  Head CT done and reassuring.  White count reassuring.  No fever.  Doubt meningitis.  Also chest pain.  X-ray reassuring.  EKG reassuring.  Low risk overall although did have pericarditis postpartum around 9 months ago.  EKG reassuring do not think we need blood work.  Can follow-up with her PCP as needed.  Have reviewed previous note from admission for pericarditis.        Final Clinical Impression(s) / ED Diagnoses Final diagnoses:  Generalized headache  Nonspecific chest pain    Rx / DC Orders ED Discharge Orders     None         Benjiman Core, MD 03/25/23 [phone]

## 2023-03-25 NOTE — Telephone Encounter (Signed)
  Chief Complaint: HA, chest pain - mostly resolved Symptoms: HA Frequency: 5 days Pertinent Negatives: Patient denies  Disposition: [] ED /[] Urgent Care (no appt availability in office) / [] Appointment(In office/virtual)/ []  [facility]/ [] Home Care/ [] Refused Recommended Disposition /[] [facility]/ [x]  Follow-up with PCP Additional Notes: Pt was seen in ED for chest pain and HA today. Pt states that chest pain has mostly resolved, however she still has a very bad HA. Pt thinks it may be sinus related. Pt has taken OTC pain and sinus medication without resolution.  Pt would like to be worked in for today for continuing evaluation of chest pain and HA. Please advise.  Reason for Disposition  [1] SEVERE headache (e.g., excruciating) AND [2] not improved after 2 hours of pain medicine  Answer Assessment - Initial Assessment Questions 1. LOCATION: "Where does it hurt?"      Frontal 2. ONSET: "When did the headache start?" (Minutes, hours or days)      5 days 3. PATTERN: "Does the pain come and go, or has it been constant since it started?"     Comes and goes 5. RECURRENT SYMPTOM: "Have you ever had headaches before?" If Yes, ask: "When was the last time?" and "What happened that time?"      Not usually 6. CAUSE: "What do you think is causing the headache?"     Sinus issue 9. OTHER SYMPTOMS: "Do you have any other symptoms?" (fever, stiff neck, eye pain, sore throat, cold symptoms)     Chest pain  Protocols used: Headache-A-AH

## 2023-03-25 NOTE — ED Triage Notes (Signed)
Pt arrives pov, steady gait with c/o HA x 5 days, mid-sternal CP "off and on" x 3 days and nausea yesterday. Endorses dizziness when bending over

## 2023-04-17 ENCOUNTER — Other Ambulatory Visit (HOSPITAL_COMMUNITY)
Admission: RE | Admit: 2023-04-17 | Discharge: 2023-04-17 | Disposition: A | Discharge status: Home / Self Care | Payer: Medicaid Other | Source: Ambulatory Visit | Attending: Family Medicine | Admitting: Family Medicine

## 2023-04-17 ENCOUNTER — Ambulatory Visit (INDEPENDENT_AMBULATORY_CARE_PROVIDER_SITE_OTHER): Payer: Medicaid Other

## 2023-04-17 DIAGNOSIS — N898 Other specified noninflammatory disorders of vagina: Secondary | ICD-10-CM | POA: Insufficient documentation

## 2023-04-17 DIAGNOSIS — A749 Chlamydial infection, unspecified: Secondary | ICD-10-CM

## 2023-04-17 NOTE — Progress Notes (Signed)
Pt here today for vaginal itching x 1 week. Has not used any home remedy. Pt recently had hx of Trich, BV  and Chlamydia. Pt has been treated for those and pt no longer with that partner. Pt states took UPT at  home 2-3 days ago that was negative. Pt advised to perform self swab. Self swab collected today. Pt advised results will take 24-48 hours and will see results in mychart and will be notified if needs further treatment. Pt verbalized understanding and agreeable to plan of care.   Judeth Cornfield, RNC

## 2023-04-18 DIAGNOSIS — A749 Chlamydial infection, unspecified: Secondary | ICD-10-CM | POA: Insufficient documentation

## 2023-04-18 MED ORDER — DOXYCYCLINE HYCLATE 100 MG PO CAPS
100.0000 mg | ORAL_CAPSULE | Freq: Two times a day (BID) | ORAL | 0 refills | Status: AC
Start: 2023-04-18 — End: 2023-04-25

## 2023-04-18 NOTE — Addendum Note (Signed)
Addended by: Merian Capron on: 04/18/2023 03:55 PM   Modules accepted: Orders

## 2023-04-21 ENCOUNTER — Ambulatory Visit: Payer: Medicaid Other

## 2023-04-28 ENCOUNTER — Ambulatory Visit
Admission: EM | Admit: 2023-04-28 | Discharge: 2023-04-28 | Disposition: A | Discharge status: Home / Self Care | Payer: Medicaid Other

## 2023-04-28 NOTE — ED Triage Notes (Signed)
Pt called for 3rd time with no answer.

## 2023-04-28 NOTE — ED Notes (Signed)
No answer when called from waiting room.  Called patient on phone, no answer.

## 2023-06-09 ENCOUNTER — Ambulatory Visit: Payer: Medicaid Other | Attending: Family Medicine

## 2023-06-09 DIAGNOSIS — Z111 Encounter for screening for respiratory tuberculosis: Secondary | ICD-10-CM

## 2023-06-09 DIAGNOSIS — Z23 Encounter for immunization: Secondary | ICD-10-CM | POA: Diagnosis not present

## 2023-06-09 NOTE — Progress Notes (Signed)
TDAP vaccine administered in right deltoid per protocols.  Information sheet given. Patient denies and pain or discomfort at injection site. Tolerated injection well no reaction.

## 2023-06-09 NOTE — Progress Notes (Deleted)
PPD Placement note Alexis Conley, 22 y.o. female is here today for placement of PPD test Reason for PPD test: *** Pt taken PPD test before: yes Is patient taking any oral or IV steroid medication now or have they taken it in the last month? yes Has the patient ever received the BCG vaccine?: no Has the patient been in recent contact with anyone known or suspected of having active TB disease?: no     Patient's Country of origin?: [place] O: Alert and oriented in NAD. P:  PPD placed on 06/09/2023.  Patient advised to return for reading within 48-72 hours.

## 2023-08-06 ENCOUNTER — Encounter: Payer: Self-pay | Admitting: Internal Medicine

## 2023-09-01 ENCOUNTER — Other Ambulatory Visit: Payer: Self-pay

## 2023-09-01 ENCOUNTER — Ambulatory Visit
Admission: EM | Admit: 2023-09-01 | Discharge: 2023-09-01 | Disposition: A | Discharge status: Home / Self Care | Payer: Medicaid Other | Attending: Physician Assistant | Admitting: Physician Assistant

## 2023-09-01 ENCOUNTER — Encounter: Payer: Self-pay | Admitting: *Deleted

## 2023-09-01 DIAGNOSIS — R509 Fever, unspecified: Secondary | ICD-10-CM | POA: Diagnosis not present

## 2023-09-01 DIAGNOSIS — J069 Acute upper respiratory infection, unspecified: Secondary | ICD-10-CM | POA: Diagnosis not present

## 2023-09-01 LAB — POC COVID19/FLU A&B COMBO
Covid Antigen, POC: NEGATIVE
Influenza A Antigen, POC: NEGATIVE
Influenza B Antigen, POC: NEGATIVE

## 2023-09-01 LAB — POCT URINE PREGNANCY: Preg Test, Ur: NEGATIVE

## 2023-09-01 MED ORDER — PROMETHAZINE-DM 6.25-15 MG/5ML PO SYRP: 5.0000 mL | ORAL_SOLUTION | Freq: Three times a day (TID) | ORAL | 0 refills | Status: DC | PRN

## 2023-09-01 MED ORDER — IBUPROFEN 800 MG PO TABS
800.0000 mg | ORAL_TABLET | Freq: Once | ORAL | Status: AC
  Administered 2023-09-01: 800 mg via ORAL

## 2023-09-01 MED ORDER — IPRATROPIUM BROMIDE 0.03 % NA SOLN: 2.0000 | Freq: Two times a day (BID) | NASAL | 12 refills | Status: DC

## 2023-09-01 NOTE — ED Triage Notes (Signed)
Congestion, sore throat, body aches, chills, fever x 1 day. She took cold and flu med last night

## 2023-09-01 NOTE — ED Provider Notes (Signed)
[facility]    CSN: [id number] Arrival date & time: 09/01/23  1659      History   Chief Complaint Chief Complaint  Patient presents with   Generalized Body Aches   Nasal Congestion    HPI Alexis Conley is a 22 y.o. female.   Patient presents today with a 24-hour history of URI symptoms including sore throat, bodyaches, chills, fever, cough, congestion.  Denies any chest pain, shortness of breath, nausea/vomiting interfering with oral intake.  Denies any recent antibiotics or steroids.  She has tried over-the-counter cold and flu medication without improvement of symptoms.  She has not had any medication today.  She denies any history of allergies, asthma, COPD, smoking.  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  She has had COVID several years ago.  She is no concern for pregnancy but does have irregular menstrual cycles with LMP 07/14/2023.    Past Medical History:  Diagnosis Date   CHF (congestive heart failure) (HCC)    Eczema    Heart murmur    When younger   TB lung, latent     Patient Active Problem List   Diagnosis Date Noted   Chlamydia 04/18/2023   Abnormal uterine bleeding (AUB) 02/06/2023   Acute pericarditis    Acute respiratory failure with hypoxia (HCC) 01/09/2022   Chest pain 01/09/2022   Elevated troponin 01/09/2022   Hypokalemia 01/09/2022   Hypertension 01/09/2022   Elevated liver enzymes 01/09/2022   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   Preeclampsia in postpartum period 01/09/2022   Acute diastolic heart failure (HCC)    Pericardial effusion    Gestational hypertension 01/03/2022   PROM (premature rupture of membranes) 01/02/2022   Alpha thalassemia silent carrier 09/04/2021   TB lung, latent 08/01/2021   Supervision of high risk pregnancy, antepartum 06/13/2021    Past Surgical History:  Procedure Laterality Date   NO PAST SURGERIES      OB History     Gravida  1    Para  1   Term  1   Preterm      AB      Living  1      SAB      IAB      Ectopic      Multiple  0   Live Births  1            Home Medications    Prior to Admission medications   Medication Sig Start Date End Date Taking? Authorizing Provider  ipratropium (ATROVENT) 0.03 % nasal spray Place 2 sprays into both nostrils every 12 (twelve) hours. 09/01/23  Yes Armany Mano K, PA-C  promethazine-dextromethorphan (PROMETHAZINE-DM) 6.25-15 MG/5ML syrup Take 5 mLs by mouth 3 (three) times daily as needed for cough. 09/01/23  Yes Shelaine Frie K, PA-C  fluticasone (FLONASE) 50 MCG/ACT nasal spray Place 1-2 sprays into both nostrils daily. 10/05/20 02/22/21  Lew Dawes, PA-C    Family History Family History  Problem Relation Age of Onset   Healthy Mother    Healthy Father     Social History Social History   Tobacco Use   Smoking status: Never   Smokeless tobacco: Never  Vaping Use   Vaping status: Never Used  Substance Use Topics   Alcohol use: Not Currently    Comment: occ   Drug use: No     Allergies   Peanut-containing drug products, Shellfish allergy, Tomato, and  Mushroom extract complex (do not select)   Review of Systems Review of Systems  Constitutional:  Positive for activity change, fatigue and fever. Negative for appetite change.  HENT:  Positive for congestion and sore throat. Negative for sinus pressure and sneezing.   Respiratory:  Positive for cough. Negative for shortness of breath.   Cardiovascular:  Negative for chest pain.  Gastrointestinal:  Negative for abdominal pain, diarrhea, nausea and vomiting.  Musculoskeletal:  Positive for arthralgias and myalgias.  Neurological:  Positive for headaches. Negative for dizziness and light-headedness.     Physical Exam Triage Vital Signs ED Triage Vitals  Encounter Vitals Group     BP 09/01/23 1837 109/64     Systolic BP Percentile --      Diastolic BP Percentile --      Pulse Rate  09/01/23 1837 (!) 121     Resp 09/01/23 1837 20     Temp 09/01/23 1837 (!) 100.4 F (38 C)     Temp Source 09/01/23 1837 Oral     SpO2 09/01/23 1837 99 %     Weight --      Height --      Head Circumference --      Peak Flow --      Pain Score 09/01/23 1840 10     Pain Loc --      Pain Education --      Exclude from Growth Chart --    No data found.  Updated Vital Signs BP 109/64 (BP Location: Left Arm)   Pulse (!) 119   Temp 99.1 F (37.3 C) (Oral)   Resp 20   LMP 07/14/2023   SpO2 99%   Breastfeeding No   Visual Acuity Right Eye Distance:   Left Eye Distance:   Bilateral Distance:    Right Eye Near:   Left Eye Near:    Bilateral Near:     Physical Exam Vitals reviewed.  Constitutional:      General: She is awake. She is not in acute distress.    Appearance: Normal appearance. She is well-developed. She is not ill-appearing.     Comments: Very pleasant female appears stated age in no acute distress sitting comfortably in exam room  HENT:     Head: Normocephalic and atraumatic.     Right Ear: Tympanic membrane, ear canal and external ear normal. Tympanic membrane is not erythematous or bulging.     Left Ear: Tympanic membrane, ear canal and external ear normal. Tympanic membrane is not erythematous or bulging.     Nose:     Right Sinus: No maxillary sinus tenderness or frontal sinus tenderness.     Left Sinus: No maxillary sinus tenderness or frontal sinus tenderness.     Mouth/Throat:     Pharynx: Uvula midline. No oropharyngeal exudate or posterior oropharyngeal erythema.  Cardiovascular:     Rate and Rhythm: Normal rate and regular rhythm.     Heart sounds: Normal heart sounds, S1 normal and S2 normal. No murmur heard. Pulmonary:     Effort: Pulmonary effort is normal.     Breath sounds: Normal breath sounds. No wheezing, rhonchi or rales.     Comments: Clear to auscultation bilaterally Psychiatric:        Behavior: Behavior is cooperative.      UC  Treatments / Results  Labs (all labs ordered are listed, but only abnormal results are displayed) Labs Reviewed  POC COVID19/FLU A&B COMBO  POCT URINE PREGNANCY  EKG   Radiology No results found.  Procedures Procedures (including critical care time)  Medications Ordered in UC Medications  ibuprofen (ADVIL) tablet 800 mg (800 mg Oral Given 09/01/23 1926)    Initial Impression / Assessment and Plan / UC Course  I have reviewed the triage vital signs and the nursing notes.  Pertinent labs & imaging results that were available during my care of the patient were reviewed by me and considered in my medical decision making (see chart for details).     Patient is well-appearing, and nontoxic.  She was tachycardic but also febrile.  No evidence of acute infection on physical exam that warrant initiation of antibiotics.  COVID and flu testing were negative.  Patient has irregular menstrual cycles and so urine pregnancy was obtained before giving her any medication and this was negative.  She was given ibuprofen 800 mg in clinic which improved her fever and slightly improved the tachycardia.  She was encouraged to push fluids and alternate over-the-counter antipyretics to manage her symptoms.  She was given Promethazine DM for cough.  We discussed that this can be sedating and she is not to drive or drink alcohol while taking it.  She was given ipratropium nasal spray to help manage rhinorrhea.  Recommended that she rest and drink plenty of fluid.  Discussed that if her symptoms are not improving within a week she is to return for reevaluation.  Should return precautions given.  Work excuse note provided.  Final Clinical Impressions(s) / UC Diagnoses   Final diagnoses:  Upper respiratory tract infection, unspecified type  Fever, unspecified     Discharge Instructions      You tested negative for COVID and flu.  I believe that you have a different virus causing your symptoms.  Take  Promethazine DM for cough.  This will make you sleepy so do not drive drink alcohol with taking it.  Use ipratropium nasal spray for congestion.  I also recommend nasal saline and sinus rinses.  Make sure that you are resting and drinking plenty fluid.  If your symptoms are improving within a week please return for reevaluation.  If anything worsens and you have high fever not responding to medication, chest pain, shortness of breath, weakness you need to be seen emergently.     ED Prescriptions     Medication Sig Dispense Auth. Provider   promethazine-dextromethorphan (PROMETHAZINE-DM) 6.25-15 MG/5ML syrup Take 5 mLs by mouth 3 (three) times daily as needed for cough. 118 mL Mykah Shin K, PA-C   ipratropium (ATROVENT) 0.03 % nasal spray Place 2 sprays into both nostrils every 12 (twelve) hours. 30 mL Jonas Goh K, PA-C      PDMP not reviewed this encounter.   Jeani Hawking, PA-C 09/01/23 2026

## 2023-09-01 NOTE — Discharge Instructions (Signed)
You tested negative for COVID and flu.  I believe that you have a different virus causing your symptoms.  Take Promethazine DM for cough.  This will make you sleepy so do not drive drink alcohol with taking it.  Use ipratropium nasal spray for congestion.  I also recommend nasal saline and sinus rinses.  Make sure that you are resting and drinking plenty fluid.  If your symptoms are improving within a week please return for reevaluation.  If anything worsens and you have high fever not responding to medication, chest pain, shortness of breath, weakness you need to be seen emergently.

## 2023-09-15 ENCOUNTER — Ambulatory Visit (INDEPENDENT_AMBULATORY_CARE_PROVIDER_SITE_OTHER): Payer: Medicaid Other

## 2023-09-15 ENCOUNTER — Encounter: Payer: Self-pay | Admitting: Internal Medicine

## 2023-09-15 ENCOUNTER — Ambulatory Visit
Admission: EM | Admit: 2023-09-15 | Discharge: 2023-09-15 | Disposition: A | Discharge status: Home / Self Care | Payer: Medicaid Other | Attending: Family Medicine | Admitting: Family Medicine

## 2023-09-15 DIAGNOSIS — R070 Pain in throat: Secondary | ICD-10-CM | POA: Diagnosis not present

## 2023-09-15 DIAGNOSIS — R051 Acute cough: Secondary | ICD-10-CM

## 2023-09-15 DIAGNOSIS — J029 Acute pharyngitis, unspecified: Secondary | ICD-10-CM | POA: Diagnosis not present

## 2023-09-15 DIAGNOSIS — J069 Acute upper respiratory infection, unspecified: Secondary | ICD-10-CM

## 2023-09-15 DIAGNOSIS — R059 Cough, unspecified: Secondary | ICD-10-CM | POA: Diagnosis not present

## 2023-09-15 LAB — POCT RAPID STREP A (OFFICE): Rapid Strep A Screen: NEGATIVE

## 2023-09-15 MED ORDER — BENZONATATE 100 MG PO CAPS: 100.0000 mg | ORAL_CAPSULE | Freq: Three times a day (TID) | ORAL | 0 refills | Status: DC | PRN

## 2023-09-15 MED ORDER — PREDNISONE 20 MG PO TABS: 40.0000 mg | ORAL_TABLET | Freq: Every day | ORAL | 0 refills | Status: AC

## 2023-09-15 NOTE — ED Triage Notes (Signed)
Pt c/o sore throat, cough, rt ear ache, and swollen lymph node since Friday. States just got over a URI a week ago. States gargling with apple cider vinegar, salt water, and promethazine cough syrup with no relief.

## 2023-09-15 NOTE — ED Provider Notes (Signed)
[facility]    CSN: [id number] Arrival date & time: 09/15/23  1728      History   Chief Complaint Chief Complaint  Patient presents with   Sore Throat    HPI Alexis Conley is a 22 y.o. female.    Sore Throat  Here for cough and congestion and sore throat.  Symptoms began on December 21.  No fever noted.  She has lost her voice.  There is lots of nasal drainage and postnasal drainage.  No vomiting or diarrhea  She has felt short of breath sometimes.    No known history of asthma and she has never used an inhaler  Periods are irregular and last one was October 21 UPT was negative on December 9 here.  NKDA  Past Medical History:  Diagnosis Date   CHF (congestive heart failure) (HCC)    Eczema    Heart murmur    When younger   TB lung, latent     Patient Active Problem List   Diagnosis Date Noted   Chlamydia 04/18/2023   Abnormal uterine bleeding (AUB) 02/06/2023   Acute pericarditis    Acute respiratory failure with hypoxia (HCC) 01/09/2022   Chest pain 01/09/2022   Elevated troponin 01/09/2022   Hypokalemia 01/09/2022   Hypertension 01/09/2022   Elevated liver enzymes 01/09/2022   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   Preeclampsia in postpartum period 01/09/2022   Acute diastolic heart failure (HCC)    Pericardial effusion    Gestational hypertension 01/03/2022   PROM (premature rupture of membranes) 01/02/2022   Alpha thalassemia silent carrier 09/04/2021   TB lung, latent 08/01/2021   Supervision of high risk pregnancy, antepartum 06/13/2021    Past Surgical History:  Procedure Laterality Date   NO PAST SURGERIES      OB History     Gravida  1   Para  1   Term  1   Preterm      AB      Living  1      SAB      IAB      Ectopic      Multiple  0   Live Births  1            Home Medications    Prior to Admission medications   Medication Sig Start Date End Date Taking? Authorizing Provider  benzonatate  (TESSALON) 100 MG capsule Take 1 capsule (100 mg total) by mouth 3 (three) times daily as needed for cough. 09/15/23  Yes Zenia Resides, MD  predniSONE (DELTASONE) 20 MG tablet Take 2 tablets (40 mg total) by mouth daily with breakfast for 5 days. 09/15/23 09/20/23 Yes Zenia Resides, MD  ipratropium (ATROVENT) 0.03 % nasal spray Place 2 sprays into both nostrils every 12 (twelve) hours. 09/01/23   Raspet, Erin K, PA-C  fluticasone (FLONASE) 50 MCG/ACT nasal spray Place 1-2 sprays into both nostrils daily. 10/05/20 02/22/21  Lew Dawes, PA-C    Family History Family History  Problem Relation Age of Onset   Healthy Mother    Healthy Father     Social History Social History   Tobacco Use   Smoking status: Never   Smokeless tobacco: Never  Vaping Use   Vaping status: Never Used  Substance Use Topics   Alcohol use: Not Currently    Comment: occ   Drug use: No     Allergies   Peanut-containing drug products, Shellfish allergy, Tomato, and Mushroom extract complex (  do not select)   Review of Systems Review of Systems   Physical Exam Triage Vital Signs ED Triage Vitals  Encounter Vitals Group     BP 09/15/23 1850 119/78     Systolic BP Percentile --      Diastolic BP Percentile --      Pulse Rate 09/15/23 1850 96     Resp 09/15/23 1850 18     Temp 09/15/23 1850 98.3 F (36.8 C)     Temp src --      SpO2 09/15/23 1850 98 %     Weight --      Height --      Head Circumference --      Peak Flow --      Pain Score 09/15/23 1851 7     Pain Loc --      Pain Education --      Exclude from Growth Chart --    No data found.  Updated Vital Signs BP 119/78 (BP Location: Right Arm)   Pulse 96   Temp 98.3 F (36.8 C)   Resp 18   LMP 07/14/2023   SpO2 98%   Breastfeeding No   Visual Acuity Right Eye Distance:   Left Eye Distance:   Bilateral Distance:    Right Eye Near:   Left Eye Near:    Bilateral Near:     Physical Exam Vitals reviewed.   Constitutional:      General: She is not in acute distress.    Appearance: She is not toxic-appearing.  HENT:     Right Ear: Tympanic membrane and ear canal normal.     Left Ear: Tympanic membrane and ear canal normal.     Nose: Congestion present.     Mouth/Throat:     Mouth: Mucous membranes are moist.     Comments: There are lots of mucus draining; there is also erythema of the posterior oropharynx Eyes:     Extraocular Movements: Extraocular movements intact.     Conjunctiva/sclera: Conjunctivae normal.     Pupils: Pupils are equal, round, and reactive to light.  Cardiovascular:     Rate and Rhythm: Normal rate and regular rhythm.     Heart sounds: No murmur heard. Pulmonary:     Effort: No respiratory distress.     Breath sounds: No stridor. No wheezing, rhonchi or rales.     Comments: No wheezing heard on exam, except that she sounds wheezy after she coughs Musculoskeletal:     Cervical back: Neck supple.  Lymphadenopathy:     Cervical: No cervical adenopathy.  Skin:    Capillary Refill: Capillary refill takes less than 2 seconds.     Coloration: Skin is not jaundiced or pale.  Neurological:     General: No focal deficit present.     Mental Status: She is alert and oriented to person, place, and time.  Psychiatric:        Behavior: Behavior normal.      UC Treatments / Results  Labs (all labs ordered are listed, but only abnormal results are displayed) Labs Reviewed  SARS CORONAVIRUS 2 (TAT 6-24 HRS)  CULTURE, GROUP A STREP [facility])  POCT RAPID STREP A (OFFICE)    EKG   Radiology No results found.  Procedures Procedures (including critical care time)  Medications Ordered in UC Medications - No data to display  Initial Impression / Assessment and Plan / UC Course  I have reviewed the triage vital signs and the nursing  notes.  Pertinent labs & imaging results that were available during my care of the patient were reviewed by me and considered in my  medical decision making (see chart for details).     Rapid strep is negative.  Throat culture is sent and we will notify and treat protocol if that is positive  Chest x-ray is clear by my review.  We will notify her if radiology interprets any differently.  Tessalon Perles were sent in for the cough and prednisone is sent in for possible asthma exacerbation since she sounds wheezy with her cough. I am not going to repeat her UPT today as it was negative just few days ago.  COVID swab is done and we will notify if positive, so they know they need to isolate.  Final Clinical Impressions(s) / UC Diagnoses   Final diagnoses:  Acute cough  Viral URI  Throat pain     Discharge Instructions      Your strep test is negative.  Culture of the throat will be sent, and staff will notify you if that is in turn positive.  Your chest x-ray by my review is clear.  The radiologist will also read your x-ray, and if their interpretation differs significantly from mine, we will call you.  Take benzonatate 100 mg, 1 tab every 8 hours as needed for cough.  Take prednisone 20 mg--2 daily for 5 days   You have been swabbed for COVID, and the test will result in the next 24 hours. Our staff will call you if positive. If the COVID test is positive, you should quarantine until you are fever free for 24 hours and you are starting to feel better, and then take added precautions for the next 5 days, such as physical distancing/wearing a mask and good hand hygiene/washing.      ED Prescriptions     Medication Sig Dispense Auth. Provider   benzonatate (TESSALON) 100 MG capsule Take 1 capsule (100 mg total) by mouth 3 (three) times daily as needed for cough. 21 capsule Zenia Resides, MD   predniSONE (DELTASONE) 20 MG tablet Take 2 tablets (40 mg total) by mouth daily with breakfast for 5 days. 10 tablet Marlinda Mike Janace Aris, MD      PDMP not reviewed this encounter.   Zenia Resides,  MD 09/15/23 [phone]

## 2023-09-15 NOTE — Discharge Instructions (Signed)
Your strep test is negative.  Culture of the throat will be sent, and staff will notify you if that is in turn positive.  Your chest x-ray by my review is clear.  The radiologist will also read your x-ray, and if their interpretation differs significantly from mine, we will call you.  Take benzonatate 100 mg, 1 tab every 8 hours as needed for cough.  Take prednisone 20 mg--2 daily for 5 days   You have been swabbed for COVID, and the test will result in the next 24 hours. Our staff will call you if positive. If the COVID test is positive, you should quarantine until you are fever free for 24 hours and you are starting to feel better, and then take added precautions for the next 5 days, such as physical distancing/wearing a mask and good hand hygiene/washing.

## 2023-09-16 LAB — SARS CORONAVIRUS 2 (TAT 6-24 HRS): SARS Coronavirus 2: NEGATIVE

## 2023-09-18 LAB — CULTURE, GROUP A STREP (THRC)

## 2023-10-20 ENCOUNTER — Encounter: Payer: Self-pay | Admitting: Family Medicine

## 2023-10-22 ENCOUNTER — Ambulatory Visit (INDEPENDENT_AMBULATORY_CARE_PROVIDER_SITE_OTHER): Payer: Medicaid Other | Admitting: *Deleted

## 2023-10-22 ENCOUNTER — Other Ambulatory Visit: Payer: Self-pay

## 2023-10-22 ENCOUNTER — Other Ambulatory Visit (HOSPITAL_COMMUNITY)
Admission: RE | Admit: 2023-10-22 | Discharge: 2023-10-22 | Disposition: A | Discharge status: Home / Self Care | Payer: Medicaid Other | Source: Ambulatory Visit | Attending: Family Medicine | Admitting: Family Medicine

## 2023-10-22 VITALS — BP 122/77 | HR 100 | Ht 62.0 in

## 2023-10-22 DIAGNOSIS — Z9189 Other specified personal risk factors, not elsewhere classified: Secondary | ICD-10-CM | POA: Diagnosis not present

## 2023-10-22 NOTE — Progress Notes (Signed)
Pt presents with request for self swab to assess for STi as she has a new partner and has not been using condoms. She reports small amount of white vaginal discharge. She denies vaginal odor or irritation. Self swab obtained. Pt will be notified of test results and treatment indicated if any via Mychart.  She voiced understanding.

## 2023-10-24 ENCOUNTER — Encounter: Payer: Self-pay | Admitting: Obstetrics & Gynecology

## 2023-10-24 ENCOUNTER — Other Ambulatory Visit: Payer: Self-pay | Admitting: Obstetrics & Gynecology

## 2023-10-24 DIAGNOSIS — B3731 Acute candidiasis of vulva and vagina: Secondary | ICD-10-CM

## 2023-10-24 DIAGNOSIS — N760 Acute vaginitis: Secondary | ICD-10-CM

## 2023-10-24 HISTORY — PX: WISDOM TOOTH EXTRACTION: SHX21

## 2023-10-24 LAB — CERVICOVAGINAL ANCILLARY ONLY
Bacterial Vaginitis (gardnerella): POSITIVE — AB
Candida Glabrata: NEGATIVE
Candida Vaginitis: POSITIVE — AB
Chlamydia: NEGATIVE
Comment: NEGATIVE
Comment: NEGATIVE
Comment: NEGATIVE
Comment: NEGATIVE
Comment: NEGATIVE
Comment: NORMAL
Neisseria Gonorrhea: NEGATIVE
Trichomonas: NEGATIVE

## 2023-10-24 MED ORDER — FLUCONAZOLE 150 MG PO TABS
150.0000 mg | ORAL_TABLET | Freq: Once | ORAL | 3 refills | Status: AC
Start: 2023-10-24 — End: 2023-10-24

## 2023-10-24 MED ORDER — METRONIDAZOLE 500 MG PO TABS
500.0000 mg | ORAL_TABLET | Freq: Two times a day (BID) | ORAL | 0 refills | Status: AC
Start: 2023-10-24 — End: 2023-10-31

## 2023-11-11 ENCOUNTER — Encounter: Payer: Medicaid Other | Admitting: Internal Medicine

## 2023-11-22 ENCOUNTER — Ambulatory Visit
Admission: EM | Admit: 2023-11-22 | Discharge: 2023-11-22 | Disposition: A | Discharge status: Home / Self Care | Attending: Physician Assistant | Admitting: Physician Assistant

## 2023-11-22 DIAGNOSIS — B9689 Other specified bacterial agents as the cause of diseases classified elsewhere: Secondary | ICD-10-CM | POA: Insufficient documentation

## 2023-11-22 DIAGNOSIS — N898 Other specified noninflammatory disorders of vagina: Secondary | ICD-10-CM | POA: Diagnosis present

## 2023-11-22 DIAGNOSIS — B3731 Acute candidiasis of vulva and vagina: Secondary | ICD-10-CM | POA: Diagnosis not present

## 2023-11-22 DIAGNOSIS — N760 Acute vaginitis: Secondary | ICD-10-CM

## 2023-11-22 DIAGNOSIS — Z113 Encounter for screening for infections with a predominantly sexual mode of transmission: Secondary | ICD-10-CM | POA: Diagnosis not present

## 2023-11-22 MED ORDER — FLUCONAZOLE 150 MG PO TABS: 150.0000 mg | ORAL_TABLET | ORAL | 0 refills | Status: DC | PRN

## 2023-11-22 NOTE — ED Triage Notes (Signed)
 Pt c/o vaginal itching and white discharge x2 days. Pt reports she has been on Amoxicillin and Clindamycin for abscess s/t wisdom teeth extraction. No known exposures to STDs but would like to checked.

## 2023-11-22 NOTE — Discharge Instructions (Signed)
 We will contact you if anything besides yeast is positive on your swab.  Wear loosefitting cotton underwear.  Take Diflucan today and you can take additional doses every 72 hours for up to 3 doses.  Use over-the-counter medication including Monistat.  If anything worsens and you have pelvic pain, abdominal pain, abnormal discharge, fever, nausea/vomiting you need to be seen immediately.

## 2023-11-22 NOTE — ED Provider Notes (Signed)
 [facility]    CSN: [id number] Arrival date & time: 11/22/23  4098      History   Chief Complaint Chief Complaint  Patient presents with   Vaginal Problem    HPI Alexis Conley is a 23 y.o. female.   Patient presents today with a 2-day history of vaginal irritation.  She describes this as an itching and burning sensation.  She has also had thick white vaginal discharge.  She denies any pelvic pain, abdominal pain, fever, nausea, vomiting.  She has not tried any over-the-counter medication for symptom management.  She has no specific concern for STI but is interested in testing today.  She does report that she was recently treated with both amoxicillin and clindamycin for abscess related to open some tooth removal.  She is still taking amoxicillin and will be done with this medication in 2 days.  She denies any history of diabetes and does not take SGLT2 inhibitor.  She declines HIV, hepatitis, syphilis testing as she had this done with her primary provider recently.  She has no concern for pregnancy.    Past Medical History:  Diagnosis Date   CHF (congestive heart failure) (HCC)    Eczema    Heart murmur    When younger   TB lung, latent     Patient Active Problem List   Diagnosis Date Noted   Chlamydia 04/18/2023   Abnormal uterine bleeding (AUB) 02/06/2023   Acute pericarditis    Acute respiratory failure with hypoxia (HCC) 01/09/2022   Chest pain 01/09/2022   Elevated troponin 01/09/2022   Hypokalemia 01/09/2022   Hypertension 01/09/2022   Elevated liver enzymes 01/09/2022   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   Preeclampsia in postpartum period 01/09/2022   Acute diastolic heart failure (HCC)    Pericardial effusion    Gestational hypertension 01/03/2022   PROM (premature rupture of membranes) 01/02/2022   Alpha thalassemia silent carrier 09/04/2021   TB lung, latent 08/01/2021   Supervision of high risk pregnancy, antepartum 06/13/2021    Past Surgical  History:  Procedure Laterality Date   NO PAST SURGERIES     WISDOM TOOTH EXTRACTION Bilateral 10/24/2023    OB History     Gravida  1   Para  1   Term  1   Preterm      AB      Living  1      SAB      IAB      Ectopic      Multiple  0   Live Births  1            Home Medications    Prior to Admission medications   Medication Sig Start Date End Date Taking? Authorizing Provider  acetaminophen-codeine (TYLENOL #3) 300-30 MG tablet Take 1 tablet by mouth every 4 (four) hours as needed. 10/23/23  Yes [provider]  amoxicillin (AMOXIL) 250 MG capsule Take 250 mg by mouth 3 (three) times daily. 10/28/23  Yes [provider]  chlorhexidine (PERIDEX) 0.12 % solution Use as directed 5 mLs in the mouth or throat 3 (three) times daily. 10/23/23  Yes [provider]  clindamycin (CLEOCIN) 300 MG capsule Take 300 mg by mouth 3 (three) times daily. 10/23/23  Yes [provider]  fluconazole (DIFLUCAN) 150 MG tablet Take 1 tablet (150 mg total) by mouth every 3 (three) days as needed for up to 3 doses. 11/22/23  Yes Deivi Huckins K, PA-C  ibuprofen (ADVIL)  400 MG tablet Take 400 mg by mouth 4 (four) times daily. 10/28/23  Yes [provider]  predniSONE (STERAPRED UNI-PAK 21 TAB) 5 MG (21) TBPK tablet Take 5 mg by mouth as directed. 10/28/23  Yes [provider]  ipratropium (ATROVENT) 0.03 % nasal spray Place 2 sprays into both nostrils every 12 (twelve) hours. Patient not taking: Reported on 10/22/2023 09/01/23   Ajani Rineer, Denny Peon K, PA-C  fluticasone (FLONASE) 50 MCG/ACT nasal spray Place 1-2 sprays into both nostrils daily. 10/05/20 02/22/21  Lew Dawes, PA-C    Family History Family History  Problem Relation Age of Onset   Healthy Mother    Healthy Father     Social History Social History   Tobacco Use   Smoking status: Never   Smokeless tobacco: Never  Vaping Use   Vaping status: Never Used  Substance Use Topics    Alcohol use: Not Currently    Comment: occ   Drug use: No     Allergies   Peanut-containing drug products, Shellfish allergy, Tomato, and Mushroom extract complex (obsolete)   Review of Systems Review of Systems  Constitutional:  Positive for activity change. Negative for appetite change, fatigue and fever.  Respiratory:  Negative for cough and shortness of breath.   Cardiovascular:  Negative for chest pain.  Gastrointestinal:  Negative for abdominal pain, diarrhea, nausea and vomiting.  Genitourinary:  Positive for vaginal discharge and vaginal pain. Negative for dysuria, flank pain, frequency, genital sores, pelvic pain, urgency and vaginal bleeding.     Physical Exam Triage Vital Signs ED Triage Vitals  Encounter Vitals Group     BP 11/22/23 1040 136/81     Systolic BP Percentile --      Diastolic BP Percentile --      Pulse Rate 11/22/23 1040 73     Resp 11/22/23 1040 18     Temp 11/22/23 1040 98 F (36.7 C)     Temp Source 11/22/23 1040 Oral     SpO2 11/22/23 1040 98 %     Weight --      Height --      Head Circumference --      Peak Flow --      Pain Score 11/22/23 1038 0     Pain Loc --      Pain Education --      Exclude from Growth Chart --    No data found.  Updated Vital Signs BP 136/81 (BP Location: Left Arm)   Pulse 73   Temp 98 F (36.7 C) (Oral)   Resp 18   LMP 11/07/2023   SpO2 98%   Visual Acuity Right Eye Distance:   Left Eye Distance:   Bilateral Distance:    Right Eye Near:   Left Eye Near:    Bilateral Near:     Physical Exam Vitals reviewed.  Constitutional:      General: She is awake. She is not in acute distress.    Appearance: Normal appearance. She is well-developed. She is not ill-appearing.     Comments: Very pleasant female appears stated age in no acute distress sitting comfortably in exam room  HENT:     Head: Normocephalic and atraumatic.  Cardiovascular:     Rate and Rhythm: Normal rate and regular rhythm.      Heart sounds: Normal heart sounds, S1 normal and S2 normal. No murmur heard. Pulmonary:     Effort: Pulmonary effort is normal.     Breath sounds:  Normal breath sounds. No wheezing, rhonchi or rales.     Comments: Clear to auscultation bilaterally Abdominal:     General: Bowel sounds are normal.     Palpations: Abdomen is soft.     Tenderness: There is no abdominal tenderness. There is no right CVA tenderness, left CVA tenderness, guarding or rebound.     Comments: Benign abdominal exam  Genitourinary:    Comments: Exam deferred Psychiatric:        Behavior: Behavior is cooperative.      UC Treatments / Results  Labs (all labs ordered are listed, but only abnormal results are displayed) Labs Reviewed  CERVICOVAGINAL ANCILLARY ONLY    EKG   Radiology No results found.  Procedures Procedures (including critical care time)  Medications Ordered in UC Medications - No data to display  Initial Impression / Assessment and Plan / UC Course  I have reviewed the triage vital signs and the nursing notes.  Pertinent labs & imaging results that were available during my care of the patient were reviewed by me and considered in my medical decision making (see chart for details).     Patient is well-appearing, afebrile, nontoxic, nontachycardic.  STI testing obtained and pending.  Will empirically treat for yeast given clinical presentation and recent antibiotic use.  3 doses of Diflucan to be separated 72 hours apart were provided.  She can use over-the-counter medication for additional symptom management including Monistat.  She declined any blood testing for STIs including hepatitis, HIV, syphilis.  If her symptoms are not improving or if anything worsens and she has pelvic pain, abdominal pain, fever, nausea, vomiting, abnormal discharge she needs to be seen immediately.  Strict return precautions given.  Final Clinical Impressions(s) / UC Diagnoses   Final diagnoses:  Acute  vaginitis  Vaginal irritation  Screening examination for STI     Discharge Instructions      We will contact you if anything besides yeast is positive on your swab.  Wear loosefitting cotton underwear.  Take Diflucan today and you can take additional doses every 72 hours for up to 3 doses.  Use over-the-counter medication including Monistat.  If anything worsens and you have pelvic pain, abdominal pain, abnormal discharge, fever, nausea/vomiting you need to be seen immediately.     ED Prescriptions     Medication Sig Dispense Auth. Provider   fluconazole (DIFLUCAN) 150 MG tablet Take 1 tablet (150 mg total) by mouth every 3 (three) days as needed for up to 3 doses. 3 tablet Markasia Carrol, Noberto Retort, PA-C      PDMP not reviewed this encounter.   Jeani Hawking, PA-C 11/22/23 1056

## 2023-11-24 LAB — CERVICOVAGINAL ANCILLARY ONLY
Bacterial Vaginitis (gardnerella): NEGATIVE
Candida Glabrata: NEGATIVE
Candida Vaginitis: POSITIVE — AB
Chlamydia: NEGATIVE
Comment: NEGATIVE
Comment: NEGATIVE
Comment: NEGATIVE
Comment: NEGATIVE
Comment: NEGATIVE
Comment: NORMAL
Neisseria Gonorrhea: NEGATIVE
Trichomonas: NEGATIVE

## 2023-12-29 ENCOUNTER — Ambulatory Visit: Payer: Medicaid Other | Admitting: Family Medicine

## 2023-12-29 ENCOUNTER — Encounter: Payer: Self-pay | Admitting: Family Medicine

## 2024-01-06 ENCOUNTER — Encounter: Payer: Self-pay | Admitting: Obstetrics and Gynecology

## 2024-01-06 ENCOUNTER — Other Ambulatory Visit: Payer: Self-pay

## 2024-01-06 ENCOUNTER — Ambulatory Visit: Admitting: Obstetrics and Gynecology

## 2024-01-06 VITALS — BP 121/73 | HR 98 | Wt 182.6 lb

## 2024-01-06 DIAGNOSIS — Z30018 Encounter for initial prescription of other contraceptives: Secondary | ICD-10-CM | POA: Diagnosis not present

## 2024-01-06 DIAGNOSIS — N939 Abnormal uterine and vaginal bleeding, unspecified: Secondary | ICD-10-CM

## 2024-01-06 DIAGNOSIS — Z3202 Encounter for pregnancy test, result negative: Secondary | ICD-10-CM | POA: Diagnosis not present

## 2024-01-06 DIAGNOSIS — Z1331 Encounter for screening for depression: Secondary | ICD-10-CM

## 2024-01-06 DIAGNOSIS — Z309 Encounter for contraceptive management, unspecified: Secondary | ICD-10-CM | POA: Insufficient documentation

## 2024-01-06 LAB — POCT PREGNANCY, URINE: Preg Test, Ur: NEGATIVE

## 2024-01-06 MED ORDER — PHEXXI 1.8-1-0.4 % VA GEL
1.0000 | Freq: Every day | VAGINAL | 11 refills | Status: DC | PRN
Start: 2024-01-06 — End: 2024-06-08

## 2024-01-06 NOTE — Progress Notes (Signed)
   GYN EXAM Patient name: Alexis Conley MRN [id number]  Date of birth: [birth year] Chief Complaint:   Amenorrhea  History of Present Illness:   Alexis Conley is a 23 y.o. [id number] African-American female being seen today for 2 months without a period.  No LMP recorded. (Menstrual status: Irregular Periods).  Patient notes not having a period for 2 months. She has had irregular periods since she started menstruating but did have a few normal periods after birth of her daughter. Not heavy when she does have periods. Multiple negative UPT at home but has been having intercourse. Denies excess hair growth.  Declines need for STI screen. (Recently screened with current partner.) Does not want to be pregnant currently. She is hesitant to try hormonal birth control. Has not done well with Liletta, OCPs.  Review of Systems:   Pertinent items are noted in HPI Denies any headaches, blurred vision, fatigue, shortness of breath, chest pain, abdominal pain, abnormal vaginal discharge/itching/odor/irritation, problems with periods, bowel movements, urination, or intercourse unless otherwise stated above. Pertinent History Reviewed:  Reviewed past medical,surgical, social and family history.  Reviewed problem list, medications and allergies. Physical Assessment:   Vitals:   01/06/24 1439  BP: 121/73  Pulse: 98  Weight: 182 lb 9 oz (82.8 kg)  Body mass index is 33.39 kg/m.        Physical Examination:   General appearance - well appearing, and in no distress  Mental status - alert, oriented to person, place, and time  Psych:  She has a normal mood and affect  Skin - warm and dry, normal color, no suspicious lesions noted   Results for orders placed or performed in visit on 01/06/24 (from the past 24 hours)  Pregnancy, urine POC   Collection Time: 01/06/24  2:45 PM  Result Value Ref Range   Preg Test, Ur NEGATIVE NEGATIVE    Assessment & Plan:   1. Abnormal uterine bleeding (AUB)  (Primary) Chronic, uncontrolled. UPT negative. Extensively reviewed prior work-up for this problem including TVUS (5/24) and labs (2/24). These ruled-out anatomic abnormality, anemia, thyroid etiology, prolactinoma, etc. Discussed that PCOS is a likely possibility given age, weight, and presentation. - Offered patient to repeat labs today with testosterone to evaluate for PCOS. Patient declines at this time. She states she was most concerned with ruling-out pregnancy - Discussed hormonal options for regulating periods and for contraception. She is hesitant given prior weight gain and heavier bleeding with OCP/IUD. Is considering a Paraguard  2. Encounter for initial prescription of other contraceptives Amenable to using Phexxi. Will consider Paraguard  Orders Placed This Encounter  Procedures   Pregnancy, urine POC    Meds:  Meds ordered this encounter  Medications   Lactic Ac-Citric Ac-Pot Bitart (PHEXXI) 1.8-1-0.4 % GEL    Sig: Place 1 applicator vaginally daily as needed (Prior to intercourse).    Dispense:  5 g    Refill:  11   Follow-up: 10/25 for pap  Maud Sorenson, MD 01/06/2024 3:14 PM

## 2024-02-24 ENCOUNTER — Encounter: Payer: Self-pay | Admitting: Family Medicine

## 2024-02-24 ENCOUNTER — Ambulatory Visit: Attending: Family Medicine | Admitting: Family Medicine

## 2024-02-24 VITALS — BP 114/73 | HR 73 | Ht 62.0 in | Wt 185.6 lb

## 2024-02-24 DIAGNOSIS — Z13228 Encounter for screening for other metabolic disorders: Secondary | ICD-10-CM

## 2024-02-24 DIAGNOSIS — Z0001 Encounter for general adult medical examination with abnormal findings: Secondary | ICD-10-CM

## 2024-02-24 DIAGNOSIS — Z6833 Body mass index (BMI) 33.0-33.9, adult: Secondary | ICD-10-CM

## 2024-02-24 DIAGNOSIS — Z131 Encounter for screening for diabetes mellitus: Secondary | ICD-10-CM

## 2024-02-24 DIAGNOSIS — E669 Obesity, unspecified: Secondary | ICD-10-CM

## 2024-02-24 DIAGNOSIS — L659 Nonscarring hair loss, unspecified: Secondary | ICD-10-CM

## 2024-02-24 DIAGNOSIS — N926 Irregular menstruation, unspecified: Secondary | ICD-10-CM | POA: Diagnosis not present

## 2024-02-24 LAB — POCT GLYCOSYLATED HEMOGLOBIN (HGB A1C): Hemoglobin A1C: 5.1 % (ref 4.0–5.6)

## 2024-02-24 NOTE — Progress Notes (Signed)
 Subjective:  Patient ID: Alexis Conley, female    DOB: 09-30-2000  Age: 23 y.o. MRN: [id number]  CC: Medical Management of Chronic Issues (PCOS/Hairloss/)     Discussed the use of AI scribe software for clinical note transcription with the patient, who gave verbal consent to proceed.  History of Present Illness Alexis Conley is a 23 year old female who presents for an annual physical exam and concerns about PCOS. Endorses a good intake of fruits and vegetables and sees a dentist regularly but not an optometrist.  She experiences irregular menstrual periods, with the last one on November 07, 2023, and the current period starting two days ago. An ultrasound from 01/2023 showed no ovarian cysts. She has significant hair thinning and loss, with hair coming out in clumps, but no scalp itching. She is concerned about this as her child is now [age] years old.  She has gained weight despite regular exercise and a healthy diet. Her last blood work was in July 2024, and she has not had a recent diabetes screening. She feels very tired lately.    Past Medical History:  Diagnosis Date   CHF (congestive heart failure) (HCC)    Eczema    Heart murmur    When younger   TB lung, latent     Past Surgical History:  Procedure Laterality Date   NO PAST SURGERIES     WISDOM TOOTH EXTRACTION Bilateral 10/24/2023    Family History  Problem Relation Age of Onset   Healthy Mother    Healthy Father     Social History   Socioeconomic History   Marital status: Single    Spouse name: Not on file   Number of children: Not on file   Years of education: Not on file   Highest education level: Not on file  Occupational History   Not on file  Tobacco Use   Smoking status: Never   Smokeless tobacco: Never  Vaping Use   Vaping status: Never Used  Substance and Sexual Activity   Alcohol use: Not Currently    Comment: occ   Drug use: No   Sexual activity: Not Currently    Birth  control/protection: None  Other Topics Concern   Not on file  Social History Narrative   Not on file   Social Drivers of Health   Financial Resource Strain: Not on file  Food Insecurity: No Food Insecurity (11/27/2022)   Hunger Vital Sign    Worried About Running Out of Food in the Last Year: Never true    Ran Out of Food in the Last Year: Never true  Transportation Needs: No Transportation Needs (11/27/2022)   PRAPARE - Administrator, Civil Service (Medical): No    Lack of Transportation (Non-Medical): No  Physical Activity: Not on file  Stress: Not on file  Social Connections: Not on file    Allergies  Allergen Reactions   Peanut-Containing Drug Products Anaphylaxis   Shellfish Allergy Anaphylaxis   Tomato Hives   Mushroom Extract Complex (Obsolete)     Childhood allergy.... Does not know reaction.     Outpatient Medications Prior to Visit  Medication Sig Dispense Refill   acetaminophen -codeine (TYLENOL  #3) 300-30 MG tablet Take 1 tablet by mouth every 4 (four) hours as needed. (Patient not taking: Reported on 02/24/2024)     amoxicillin (AMOXIL) 250 MG capsule Take 250 mg by mouth 3 (three) times daily. (Patient not taking: Reported on 02/24/2024)  chlorhexidine (PERIDEX) 0.12 % solution Use as directed 5 mLs in the mouth or throat 3 (three) times daily. (Patient not taking: Reported on 02/24/2024)     clindamycin (CLEOCIN) 300 MG capsule Take 300 mg by mouth 3 (three) times daily. (Patient not taking: Reported on 02/24/2024)     fluconazole  (DIFLUCAN ) 150 MG tablet Take 1 tablet (150 mg total) by mouth every 3 (three) days as needed for up to 3 doses. (Patient not taking: Reported on 02/24/2024) 3 tablet 0   ibuprofen  (ADVIL ) 400 MG tablet Take 400 mg by mouth 4 (four) times daily. (Patient not taking: Reported on 02/24/2024)     ipratropium (ATROVENT ) 0.03 % nasal spray Place 2 sprays into both nostrils every 12 (twelve) hours. (Patient not taking: Reported on 10/22/2023) 30  mL 12   Lactic Ac-Citric Ac-Pot Bitart (PHEXXI ) 1.8-1-0.4 % GEL Place 1 applicator vaginally daily as needed (Prior to intercourse). 5 g 11   predniSONE  (STERAPRED UNI-PAK 21 TAB) 5 MG (21) TBPK tablet Take 5 mg by mouth as directed. (Patient not taking: Reported on 02/24/2024)     No facility-administered medications prior to visit.     ROS Review of Systems  Constitutional:  Positive for fatigue. Negative for activity change and appetite change.  HENT:  Negative for sinus pressure and sore throat.   Respiratory:  Negative for chest tightness, shortness of breath and wheezing.   Cardiovascular:  Negative for chest pain and palpitations.  Gastrointestinal:  Negative for abdominal distention, abdominal pain and constipation.  Genitourinary: Negative.   Musculoskeletal: Negative.   Psychiatric/Behavioral:  Negative for behavioral problems and dysphoric mood.     Objective:  BP 114/73   Pulse 73   Ht 5\' 2"  (1.575 m)   Wt 185 lb 9.6 oz (84.2 kg)   SpO2 100%   BMI 33.95 kg/m      02/24/2024    1:52 PM 01/06/2024    2:39 PM 11/22/2023   10:40 AM  BP/Weight  Systolic BP 114 121 136  Diastolic BP 73 73 81  Wt. (Lbs) 185.6 182.56   BMI 33.95 kg/m2 33.39 kg/m2       Physical Exam Constitutional:      General: She is not in acute distress.    Appearance: She is well-developed. She is not diaphoretic.  HENT:     Head: Normocephalic.     Right Ear: External ear normal.     Left Ear: External ear normal.     Nose: Nose normal.     Mouth/Throat:     Mouth: Mucous membranes are moist.  Eyes:     Extraocular Movements: Extraocular movements intact.     Conjunctiva/sclera: Conjunctivae normal.     Pupils: Pupils are equal, round, and reactive to light.  Neck:     Vascular: No JVD.  Cardiovascular:     Rate and Rhythm: Normal rate and regular rhythm.     Pulses: Normal pulses.     Heart sounds: Normal heart sounds. No murmur heard.    No gallop.  Pulmonary:     Effort:  Pulmonary effort is normal. No respiratory distress.     Breath sounds: Normal breath sounds. No wheezing or rales.  Chest:     Chest wall: No tenderness.  Abdominal:     General: Bowel sounds are normal. There is no distension.     Palpations: Abdomen is soft. There is no mass.     Tenderness: There is no abdominal tenderness.  Musculoskeletal:  General: No tenderness. Normal range of motion.     Cervical back: Normal range of motion.  Skin:    General: Skin is warm and dry.  Neurological:     Mental Status: She is alert and oriented to person, place, and time.     Deep Tendon Reflexes: Reflexes are normal and symmetric.  Psychiatric:        Mood and Affect: Mood normal.        Latest Ref Rng & Units 03/25/2023    8:35 AM 01/11/2022    4:19 AM 01/10/2022    5:05 AM  CMP  Glucose 70 - 99 mg/dL 86  88  97   BUN 6 - 20 mg/dL 13  8  <5   Creatinine 0.44 - 1.00 mg/dL 5.63  8.75  6.43   Sodium 135 - 145 mmol/L 137  141  133   Potassium 3.5 - 5.1 mmol/L 3.8  4.0  3.2   Chloride 98 - 111 mmol/L 105  109  101   CO2 22 - 32 mmol/L 25  26  24    Calcium 8.9 - 10.3 mg/dL 9.7  7.8  6.6   Total Protein 6.5 - 8.1 g/dL  5.9  6.0   Total Bilirubin 0.3 - 1.2 mg/dL  0.3  0.4   Alkaline Phos 38 - 126 U/L  136  158   AST 15 - 41 U/L  17  31   ALT 0 - 44 U/L  51  73     Lipid Panel  No results found for: "CHOL", "TRIG", "HDL", "CHOLHDL", "VLDL", "LDLCALC", "LDLDIRECT"  CBC    Component Value Date/Time   WBC 4.6 03/25/2023 0835   RBC 5.09 03/25/2023 0835   HGB 13.0 03/25/2023 0835   HGB 12.4 11/21/2022 1054   HCT 40.5 03/25/2023 0835   HCT 40.7 11/21/2022 1054   PLT 206 03/25/2023 0835   PLT 283 11/21/2022 1054   MCV 79.6 (L) 03/25/2023 0835   MCV 82 11/21/2022 1054   MCH 25.5 (L) 03/25/2023 0835   MCHC 32.1 03/25/2023 0835   RDW 14.7 03/25/2023 0835   RDW 14.6 11/21/2022 1054   LYMPHSABS 1.2 02/02/2022 1448   LYMPHSABS 1.4 12/18/2021 1707   MONOABS 0.5 02/02/2022 1448    EOSABS 0.1 02/02/2022 1448   EOSABS 0.1 12/18/2021 1707   BASOSABS 0.0 02/02/2022 1448   BASOSABS 0.0 12/18/2021 1707    Lab Results  Component Value Date   HGBA1C 5.1 02/24/2024      1. Annual visit for general adult medical examination with abnormal findings (Primary) Counseled on 150 minutes of exercise per week, healthy eating (including decreased daily intake of saturated fats, cholesterol, added sugars, sodium), routine healthcare maintenance.   2. Screening for metabolic disorder - CMP14+EGFR - CBC with Differential/Platelet  3. Screening for diabetes mellitus - Hemoglobin A1c - POCT glycosylated hemoglobin (Hb A1C)  4. Hair loss Check thyroid labs Advised that after childbirth hair loss is not unexpected but this is kind of far out given her child is already 2 years  5. Moderate obesity - Estradiol  - FSH/LH - T4, free - TSH - T3 - Amb Ref to Medical Weight Management  Her Pap smear was last done in 2022 which revealed ASCUS, HPV negative .  Advised to schedule appointment with her GYN who usually performs her Pap smears and she can also discuss concerns about PCOS.  No orders of the defined types were placed in this encounter.   Follow-up: Return  in about 1 year (around 02/23/2025) for CPE/ Preventive Health Exam.       Joaquin Mulberry, MD, FAAFP. [facility] [place], [place] [phone]   02/24/2024, 3:04 PM

## 2024-02-24 NOTE — Patient Instructions (Signed)
 VISIT SUMMARY:  You had your annual physical exam today and discussed concerns about Polycystic Ovary Syndrome (PCOS), hair loss, and weight gain. We reviewed your symptoms and planned several tests to better understand your health issues.  YOUR PLAN:  -POLYCYSTIC OVARY SYNDROME (PCOS): PCOS is a condition that affects hormone levels, leading to symptoms like irregular menstrual cycles, hair thinning, and weight gain. We will conduct blood tests to check your thyroid function and hormone levels, and screen for diabetes. You will also be referred to an OB/GYN for further evaluation.  -HAIR LOSS: Your significant hair loss could be due to hormonal changes, stress, or thyroid dysfunction. We will perform blood tests to check your thyroid function and hormone levels, and evaluate for any stressors or other causes.  -WEIGHT GAIN: Your weight gain despite regular exercise and a healthy diet could be related to PCOS or other metabolic issues. We will screen for diabetes and, if the results are normal, refer you to a medical weight management clinic.  -GENERAL HEALTH MAINTENANCE: You are due for a Pap smear, as your last one in 2022 showed atypical cells. You are up to date on vaccinations and dental visits, but you need to schedule an eye exam.  INSTRUCTIONS:  Please follow up on your blood work results in 3 business days. You will also be contacted by the weight management clinic to schedule an appointment. Make sure to schedule a Pap smear with your OB/GYN and an eye exam with an optometrist.

## 2024-02-25 ENCOUNTER — Ambulatory Visit: Payer: Self-pay | Admitting: Family Medicine

## 2024-02-25 DIAGNOSIS — R7989 Other specified abnormal findings of blood chemistry: Secondary | ICD-10-CM

## 2024-02-25 LAB — CMP14+EGFR
ALT: 16 IU/L (ref 0–32)
AST: 21 IU/L (ref 0–40)
Albumin: 4.3 g/dL (ref 4.0–5.0)
Alkaline Phosphatase: 103 IU/L (ref 44–121)
BUN/Creatinine Ratio: 26 — ABNORMAL HIGH (ref 9–23)
BUN: 15 mg/dL (ref 6–20)
Bilirubin Total: <0.2 mg/dL (ref 0.0–1.2)
CO2: 19 mmol/L — ABNORMAL LOW (ref 20–29)
Calcium: 9.4 mg/dL (ref 8.7–10.2)
Chloride: 105 mmol/L (ref 96–106)
Creatinine, Ser: 0.57 mg/dL (ref 0.57–1.00)
Globulin, Total: 2.8 g/dL (ref 1.5–4.5)
Glucose: 82 mg/dL (ref 70–99)
Potassium: 4.8 mmol/L (ref 3.5–5.2)
Sodium: 140 mmol/L (ref 134–144)
Total Protein: 7.1 g/dL (ref 6.0–8.5)
eGFR: 129 mL/min/{1.73_m2} (ref >59)

## 2024-02-25 LAB — ESTRADIOL: Estradiol: 43.5 pg/mL

## 2024-02-25 LAB — CBC WITH DIFFERENTIAL/PLATELET
Basophils Absolute: 0 10*3/uL (ref 0.0–0.2)
Basos: 1 %
EOS (ABSOLUTE): 0.1 10*3/uL (ref 0.0–0.4)
Eos: 2 %
Hematocrit: 40.9 % (ref 34.0–46.6)
Hemoglobin: 12.7 g/dL (ref 11.1–15.9)
Immature Grans (Abs): 0 10*3/uL (ref 0.0–0.1)
Immature Granulocytes: 0 %
Lymphocytes Absolute: 2.7 10*3/uL (ref 0.7–3.1)
Lymphs: 42 %
MCH: 25.7 pg — ABNORMAL LOW (ref 26.6–33.0)
MCHC: 31.1 g/dL — ABNORMAL LOW (ref 31.5–35.7)
MCV: 83 fL (ref 79–97)
Monocytes Absolute: 0.5 10*3/uL (ref 0.1–0.9)
Monocytes: 8 %
Neutrophils Absolute: 3 10*3/uL (ref 1.4–7.0)
Neutrophils: 47 %
Platelets: 270 10*3/uL (ref 150–450)
RBC: 4.94 x10E6/uL (ref 3.77–5.28)
RDW: 14.4 % (ref 11.7–15.4)
WBC: 6.3 10*3/uL (ref 3.4–10.8)

## 2024-02-25 LAB — FSH/LH
FSH: 6.3 m[IU]/mL
LH: 31.3 m[IU]/mL

## 2024-02-25 LAB — HEMOGLOBIN A1C
Est. average glucose Bld gHb Est-mCnc: 105 mg/dL
Hgb A1c MFr Bld: 5.3 % (ref 4.8–5.6)

## 2024-02-25 LAB — TSH: TSH: 0.219 u[IU]/mL — ABNORMAL LOW (ref 0.450–4.500)

## 2024-02-25 LAB — T4, FREE: Free T4: 1.14 ng/dL (ref 0.82–1.77)

## 2024-02-25 LAB — T3: T3, Total: 120 ng/dL (ref 71–180)

## 2024-03-16 ENCOUNTER — Encounter: Admitting: Family Medicine

## 2024-03-22 ENCOUNTER — Encounter (INDEPENDENT_AMBULATORY_CARE_PROVIDER_SITE_OTHER): Payer: Self-pay

## 2024-03-25 ENCOUNTER — Ambulatory Visit: Attending: Family Medicine

## 2024-03-25 DIAGNOSIS — R7989 Other specified abnormal findings of blood chemistry: Secondary | ICD-10-CM

## 2024-03-26 LAB — TSH: TSH: 0.161 u[IU]/mL — ABNORMAL LOW (ref 0.450–4.500)

## 2024-03-26 LAB — T4, FREE: Free T4: 1.18 ng/dL (ref 0.82–1.77)

## 2024-03-26 LAB — T3: T3, Total: 144 ng/dL (ref 71–180)

## 2024-03-29 ENCOUNTER — Ambulatory Visit: Payer: Self-pay | Admitting: Family Medicine

## 2024-03-29 DIAGNOSIS — E0590 Thyrotoxicosis, unspecified without thyrotoxic crisis or storm: Secondary | ICD-10-CM

## 2024-03-29 MED ORDER — METHIMAZOLE 5 MG PO TABS: 5.0000 mg | ORAL_TABLET | Freq: Three times a day (TID) | ORAL | 2 refills | Status: DC

## 2024-04-14 ENCOUNTER — Other Ambulatory Visit (HOSPITAL_COMMUNITY)
Admission: RE | Admit: 2024-04-14 | Discharge: 2024-04-14 | Disposition: A | Discharge status: Home / Self Care | Source: Ambulatory Visit | Attending: Family Medicine | Admitting: Family Medicine

## 2024-04-14 ENCOUNTER — Other Ambulatory Visit: Payer: Self-pay

## 2024-04-14 ENCOUNTER — Ambulatory Visit: Admitting: Family Medicine

## 2024-04-14 ENCOUNTER — Encounter: Payer: Self-pay | Admitting: Family Medicine

## 2024-04-14 VITALS — BP 119/76 | HR 96 | Ht 62.0 in | Wt 184.3 lb

## 2024-04-14 DIAGNOSIS — Z124 Encounter for screening for malignant neoplasm of cervix: Secondary | ICD-10-CM

## 2024-04-14 DIAGNOSIS — Z113 Encounter for screening for infections with a predominantly sexual mode of transmission: Secondary | ICD-10-CM | POA: Diagnosis not present

## 2024-04-14 NOTE — Progress Notes (Signed)
 ANNUAL EXAM Patient name: Alexis Conley MRN [id number]  Date of birth: [birth year] Chief Complaint:   Gynecologic Exam  History of Present Illness:   Alexis Conley is a 23 y.o. G38P1001 African-American female being seen today for a routine annual exam.  Current complaints: Weight gain, abnormal periods  Patient's last menstrual period was 03/06/2024 (approximate).   Upstream - 04/14/24 0936       Pregnancy Intention Screening   Does the patient want to become pregnant in the next year? No    Does the patient's partner want to become pregnant in the next year? No    Would the patient like to discuss contraceptive options today? No      Contraception Wrap Up   Current Method Female Condom    End Method Female Condom    Contraception Counseling Provided Yes         The pregnancy intention screening data noted above was reviewed. Potential methods of contraception were discussed. The patient elected to proceed with Female Condom.   Last pap 07/04/2021. Results were: ASCUS w/ HRHPV negative. H/O abnormal pap: yes Last mammogram: Never done Last colonoscopy: Never done     04/14/2024    9:44 AM 02/24/2024    1:54 PM 01/06/2024    2:36 PM 11/27/2022   10:17 AM 11/21/2022    9:49 AM  Depression screen PHQ 2/9  Decreased Interest 2 2 0 1 2  Down, Depressed, Hopeless 0 0 0 0 0  PHQ - 2 Score 2 2 0 1 2  Altered sleeping 0 0 0 0 0  Tired, decreased energy 2 1 0 1 2  Change in appetite 2 0 0 1 1  Feeling bad or failure about yourself  0 0 0 0 0  Trouble concentrating 0 0 0 0 0  Moving slowly or fidgety/restless 0 0 0 0 0  Suicidal thoughts 0 0 0 0 0  PHQ-9 Score 6 3 0 3 5  Difficult doing work/chores Not difficult at all Not difficult at all  Not difficult at all         04/14/2024    9:45 AM 02/24/2024    1:55 PM 01/06/2024    2:36 PM 11/27/2022   10:17 AM  GAD 7 : Generalized Anxiety Score  Nervous, Anxious, on Edge 0 0 0 0  Control/stop worrying 0 0 0 0  Worry too much -  different things 0 0 0 0  Trouble relaxing 0 0 0 0  Restless 0 0 0 0  Easily annoyed or irritable 1 0 0 0  Afraid - awful might happen 0 0 0 0  Total GAD 7 Score 1 0 0 0  Anxiety Difficulty Not difficult at all   Not difficult at all     Review of Systems:   Pertinent items are noted in HPI Denies any headaches, blurred vision, fatigue, shortness of breath, chest pain, abdominal pain, abnormal vaginal discharge/itching/odor/irritation, problems with periods, bowel movements, urination, or intercourse unless otherwise stated above. Pertinent History Reviewed:  Reviewed past medical,surgical, social and family history.  Reviewed problem list, medications and allergies. Physical Assessment:   Vitals:   04/14/24 0935  BP: 119/76  Pulse: 96  Weight: 184 lb 5 oz (83.6 kg)  Height: 5' 2 (1.575 m)  Body mass index is 33.71 kg/m.        Physical Examination:   General appearance - well appearing, and in no distress  Mental status - alert, oriented to  person, place, and time  Psych:  She has a normal mood and affect  Skin - warm and dry, normal color, no suspicious lesions noted  Chest - effort normal, all lung fields clear to auscultation bilaterally  Heart - normal rate and regular rhythm  Neck:  midline trachea, no thyromegaly or nodules  Abdomen - soft, nontender, nondistended, no masses or organomegaly  Pelvic - VULVA: normal appearing vulva with no masses, tenderness or lesions  VAGINA: normal appearing vagina with normal color and discharge, no lesions  CERVIX: normal appearing cervix without discharge or lesions, no CMT  Thin prep pap is done with HR HPV cotesting  Extremities:  No swelling or varicosities noted  Chaperone present for exam  No results found for this or any previous visit (from the past 24 hours).  Assessment & Plan:  1) Well-Woman Exam  2) hyperthyroidism-patient with known hypothyroidism.  Follows with primary care provider.  Recently started on  methimazole  but patient reports she has not started taking the medicine because she was worried about side effects.  Discussed that some of her symptoms may be coming from the hyperthyroidism and recommended trying the methimazole .  She is agreeable to this  Labs/procedures today: Wet prep, Pap smear  Mammogram: @ 25yo, or sooner if problems Colonoscopy: @ 25yo, or sooner if problems  No orders of the defined types were placed in this encounter.   Meds: No orders of the defined types were placed in this encounter.   Follow-up: No follow-ups on file.  Lanessa Shill V Henrietta Cieslewicz, MD 04/14/2024 10:14 AM

## 2024-04-15 ENCOUNTER — Ambulatory Visit: Payer: Self-pay | Admitting: Family Medicine

## 2024-04-15 LAB — CERVICOVAGINAL ANCILLARY ONLY
Bacterial Vaginitis (gardnerella): NEGATIVE
Candida Glabrata: NEGATIVE
Candida Vaginitis: POSITIVE — AB
Comment: NEGATIVE
Comment: NEGATIVE
Comment: NEGATIVE
Comment: NEGATIVE
Trichomonas: NEGATIVE

## 2024-04-15 MED ORDER — FLUCONAZOLE 150 MG PO TABS: 150.0000 mg | ORAL_TABLET | Freq: Once | ORAL | 0 refills | Status: AC

## 2024-04-23 LAB — CYTOLOGY - PAP
Chlamydia: NEGATIVE
Comment: NEGATIVE
Comment: NEGATIVE
Comment: NEGATIVE
Comment: NORMAL
Diagnosis: UNDETERMINED — AB
HSV1: NEGATIVE
HSV2: NEGATIVE
High risk HPV: POSITIVE — AB
Neisseria Gonorrhea: NEGATIVE

## 2024-04-28 ENCOUNTER — Ambulatory Visit

## 2024-05-16 NOTE — Progress Notes (Unsigned)
 Office: [phone]  /  Fax: [phone]   Initial Visit    Alexis Conley was seen in clinic today to evaluate for obesity. She is interested in losing weight to improve overall health and reduce the risk of weight related complications. She presents today to review program treatment options, initial physical assessment, and evaluation.     She was referred by: PCP  When asked what else they would like to accomplish? She states: Adopt a healthier eating pattern and lifestyle, Improve energy levels and physical activity, Improve existing medical conditions, Improve quality of life, Improve appearance, Improve self-confidence, and Lose 45-50 lbs  When asked how has your weight affected you? She states: Has affected self-esteem, Contributed to medical problems, Contributed to orthopedic problems or mobility issues, Having fatigue, Having poor endurance, Problems with eating patterns, and Has affected mood   Weight history: Alexis Conley is a 23 year old female with obesity and congestive heart failure who presents for an initial obesity treatment evaluation.  Her weight issues began around 2020, coinciding with the COVID-19 pandemic. She maintained a healthy weight during high school but started gaining weight after graduation. She had a baby in 2023, which also contributed to her weight gain. She has attempted weight loss multiple times in the past, including programs like Weight Watchers, but is not currently following any specific diet or exercise regimen. Her highest non-pregnant weight was 192 pounds, and she currently weighs between 185 and 190 pounds. Her goal weight is between 135 and 140 pounds. She is interested in adopting healthier eating patterns and increasing her physical activity to improve her overall health and self-confidence.  She has been diagnosed with hyperthyroidism and has been on methimazole  for about a month. She is unsure if the medication has affected her weight.  She is due for blood work to assess her thyroid  function.  She has a history of congestive heart failure and is followed by a heart failure clinic. She is followed by a heart failure clinic and has had an echocardiogram in the past.  She experiences fatigue and poor endurance at times, and occasionally goes long periods without eating due to her busy schedule. She reports mood changes related to her weight, feeling 'moody' but not depressed. She has experienced back pain, which she attributes to multiple factors, including her weight.  She has a family history of obesity and experiences moderate to high levels of stress. She used to go to the gym daily but has not returned since having her wisdom teeth removed in February. She works as an Ambulance person, which involves a sedentary job, and she occasionally relies on convenience foods due to her hectic lifestyle.  No high blood pressure, arthritis, cholesterol problems, fatty liver disease, obstructive sleep apnea, diabetes, prediabetes, insulin resistance, gastroesophageal reflux disease, overactive bladder, polycystic ovary syndrome, heart arrhythmias, asthma, lung disease, kidney disease, vitamin D deficiency, connective tissue diseases, or venous insufficiency.  Highest weight: 192 lbs  Some associated conditions: Heart Failure  Contributing factors: family history of obesity, consumption of processed foods, use of obesogenic medications: Other: methlmazole, moderate to high levels of stress, reduced physical activity, slow metabolism for age, enticing relationships and enviroment, multiple weight loss attempts in the past, hectic pace of life, need for convenient foods, and self - critic or all-or-none mindset  Weight promoting medications identified: Other: methlmazole  Prior weight loss attempts: Weight Watchers  Current nutrition plan: None  Current level of physical activity: None  Current or previous pharmacotherapy:  None and Is  interested in pharmacotherapy  Response to medication: Never tried medications   Past medical history includes:   Past Medical History:  Diagnosis Date   CHF (congestive heart failure) (HCC)    Eczema    Heart murmur    When younger   TB lung, latent      Objective    BP 117/72   Pulse 66   Temp 98.1 F (36.7 C)   Ht 5' 1 (1.549 m)   Wt 186 lb 12.8 oz (84.7 kg)   SpO2 100%   BMI 35.30 kg/m  She was weighed on the bioimpedance scale: Body mass index is 35.3 kg/m.  Body Fat%:42.8%, Visceral Fat Rating:8, Weight trend over the last 12 months: Increasing  General:  Alert, oriented and cooperative. Patient is in no acute distress.  Respiratory: Normal respiratory effort, no problems with respiration noted   Gait: able to ambulate independently  Mental Status: Normal mood and affect. Normal behavior. Normal judgment and thought content.   DIAGNOSTIC DATA REVIEWED:  BMET    Component Value Date/Time   NA 140 02/24/2024 1505   K 4.8 02/24/2024 1505   CL 105 02/24/2024 1505   CO2 19 (L) 02/24/2024 1505   GLUCOSE 82 02/24/2024 1505   GLUCOSE 86 03/25/2023 0835   BUN 15 02/24/2024 1505   CREATININE 0.57 02/24/2024 1505   CALCIUM 9.4 02/24/2024 1505   GFRNONAA >60 03/25/2023 0835   GFRAA >60 03/30/2020 1809   Lab Results  Component Value Date   HGBA1C 5.3 02/24/2024   HGBA1C 5.2 07/04/2021   No results found for: INSULIN CBC    Component Value Date/Time   WBC 6.3 02/24/2024 1505   WBC 4.6 03/25/2023 0835   RBC 4.94 02/24/2024 1505   RBC 5.09 03/25/2023 0835   HGB 12.7 02/24/2024 1505   HCT 40.9 02/24/2024 1505   PLT 270 02/24/2024 1505   MCV 83 02/24/2024 1505   MCH 25.7 (L) 02/24/2024 1505   MCH 25.5 (L) 03/25/2023 0835   MCHC 31.1 (L) 02/24/2024 1505   MCHC 32.1 03/25/2023 0835   RDW 14.4 02/24/2024 1505   Iron/TIBC/Ferritin/ %Sat No results found for: IRON, TIBC, FERRITIN, IRONPCTSAT Lipid Panel  No results found for: CHOL, TRIG,  HDL, CHOLHDL, VLDL, LDLCALC, LDLDIRECT Hepatic Function Panel     Component Value Date/Time   PROT 7.1 02/24/2024 1505   ALBUMIN 4.3 02/24/2024 1505   AST 21 02/24/2024 1505   ALT 16 02/24/2024 1505   ALKPHOS 103 02/24/2024 1505   BILITOT <0.2 02/24/2024 1505   BILIDIR <0.1 01/09/2022 0035   IBILI NOT CALCULATED 01/09/2022 0035      Component Value Date/Time   TSH 0.161 (L) 03/25/2024 1414     Assessment and Plan   Alpha thalassemia silent carrier  Preeclampsia in postpartum period  Elevated liver enzymes  Acute viral pericarditis  Acute diastolic heart failure (HCC)  Mediastinal mass  Hyperthyroidism  Class 2 severe obesity due to excess calories with serious comorbidity and body mass index (BMI) of 35.0 to 35.9 in adult [facility])  Current BMI 35.3  Assessment and Plan Assessment & Plan Obesity Obesity has been an issue since 2020, exacerbated by pregnancy in 2023 and lifestyle changes during COVID-19. Current weight is 185-190 pounds, with a goal to reduce to 135-140 pounds. Body adipose is at 42.8%, with a target of 25%. No diabetes, hypertension, or sleep apnea. Obesity may contribute to orthopedic issues and fatigue. She is interested in healthier eating  patterns, increasing physical activity, and open to medication-assisted weight loss. Discussed risks of obesity, including type 2 diabetes, fatty liver, kidney issues, strokes, heart attacks, and increased cancer risk. - Schedule new patient appointment for obesity program. - Conduct metabolism test after 8-hour fast, including no caffeine. - Develop personalized nutrition plan based on metabolism test results. - Schedule follow-up appointments every two weeks for the first three months. - Consider medication-assisted weight loss if clinically indicated.  Congestive heart failure Congestive heart failure is a significant risk factor. Last echocardiogram showed an ejection fraction of 60-65% with no  regional wall abnormalities. Currently followed by the heart failure clinic but has not made a recent appointment.  Emphasized the importance of monitoring heart health, especially given her young age. - Encourage follow-up with heart failure clinic. - Monitor heart health as part of the obesity program.  Hyperthyroidism Recently diagnosed with hyperthyroidism and started on methimazole  for one month. No significant changes in weight noted. Blood work is pending to assess current status.  - Obtain blood work to assess current thyroid  status.        Obesity Treatment / Action Plan:  Patient will work on garnering support from family and friends to begin weight loss journey. Will work on eliminating or reducing the presence of highly palatable, calorie dense foods in the home. Will complete provided nutritional and psychosocial assessment questionnaire before the next appointment. Will be scheduled for indirect calorimetry to determine resting energy expenditure in a fasting state.  This will allow us  to create a reduced calorie, high-protein meal plan to promote loss of fat mass while preserving muscle mass. Will think about ideas on how to incorporate physical activity into their daily routine. Will work on reducing intake of added sugars, simple sugars and processed carbs. Will avoid skipping meals which may result in increased hunger signals and overeating at certain times. Will reduce the frequency of eating out and making healthier choices by advanced menu planning. Will work on managing stress via relaxation methods as this may result in unhealthy eating patterns. Will work on reading labels, making healthier choices and watching portion sizes. Counseled on the health benefits of losing 5%-15% of total body weight. Will work on improving sleep hygiene and trying to obtain at least 7 hours of sleep. Was counseled on nutritional approaches to weight loss and benefits of reducing  processed foods and consuming plant-based foods and high quality protein as part of nutritional weight management. Was counseled on pharmacotherapy and role as an adjunct in weight management.  Will work on increasing water intake with a goal of 125 ounces for men and 91 ounces for women.   Obesity Education Performed Today:  She was weighed on the bioimpedance scale and results were discussed and documented in the synopsis.  We discussed obesity as a disease and the importance of a more detailed evaluation of all the factors contributing to the disease.  We discussed the importance of long term lifestyle changes which include nutrition, exercise and behavioral modifications as well as the importance of customizing this to her specific health and social needs.  We discussed the benefits of reaching a healthier weight to alleviate the symptoms of existing conditions and reduce the risks of the biomechanical, metabolic and psychological effects of obesity.  We reviewed the four pillars of obesity medicine and importance of using a multimodal approach.  We reviewed the basic principles in weight management.   Alexis Conley appears to be in the action stage of  change and states they are ready to start intensive lifestyle modifications and behavioral modifications.  I have spent 30 minutes in the care of the patient today including: 2 minutes before the visit reviewing and preparing the chart. 22 minutes face-to-face assessing and reviewing listed medical problems as outlined in obesity care plan, providing nutritional and behavioral counseling on topics outlined in the obesity care plan, independently interpreting test results and goals of care, as described in assessment and plan, reviewing and discussing biometric information and progress, and reviewing latest PCP notes and specialist consultations 6 minutes after the visit updating chart and documentation of encounter.  Reviewed by  clinician on day of visit: allergies, medications, problem list, medical history, surgical history, family history, social history, and previous encounter notes pertinent to obesity diagnosis.  Alexis Vandehei,PA-C

## 2024-05-17 ENCOUNTER — Ambulatory Visit (INDEPENDENT_AMBULATORY_CARE_PROVIDER_SITE_OTHER): Admitting: Physician Assistant

## 2024-05-17 ENCOUNTER — Encounter (INDEPENDENT_AMBULATORY_CARE_PROVIDER_SITE_OTHER): Payer: Self-pay | Admitting: Physician Assistant

## 2024-05-17 VITALS — BP 117/72 | HR 66 | Temp 98.1°F | Ht 61.0 in | Wt 186.8 lb

## 2024-05-17 DIAGNOSIS — E0590 Thyrotoxicosis, unspecified without thyrotoxic crisis or storm: Secondary | ICD-10-CM | POA: Diagnosis not present

## 2024-05-17 DIAGNOSIS — I5031 Acute diastolic (congestive) heart failure: Secondary | ICD-10-CM

## 2024-05-17 DIAGNOSIS — Z6835 Body mass index (BMI) 35.0-35.9, adult: Secondary | ICD-10-CM

## 2024-05-17 DIAGNOSIS — J9859 Other diseases of mediastinum, not elsewhere classified: Secondary | ICD-10-CM

## 2024-05-17 DIAGNOSIS — D563 Thalassemia minor: Secondary | ICD-10-CM

## 2024-05-17 DIAGNOSIS — Z0289 Encounter for other administrative examinations: Secondary | ICD-10-CM

## 2024-05-17 DIAGNOSIS — I301 Infective pericarditis: Secondary | ICD-10-CM

## 2024-05-17 DIAGNOSIS — E66812 Obesity, class 2: Secondary | ICD-10-CM | POA: Diagnosis not present

## 2024-05-17 DIAGNOSIS — R748 Abnormal levels of other serum enzymes: Secondary | ICD-10-CM

## 2024-05-17 DIAGNOSIS — O1495 Unspecified pre-eclampsia, complicating the puerperium: Secondary | ICD-10-CM

## 2024-05-19 ENCOUNTER — Emergency Department (HOSPITAL_COMMUNITY)
Admission: EM | Admit: 2024-05-19 | Discharge: 2024-05-20 | Disposition: A | Discharge status: Home / Self Care | Attending: Emergency Medicine | Admitting: Emergency Medicine

## 2024-05-19 ENCOUNTER — Other Ambulatory Visit: Payer: Self-pay

## 2024-05-19 DIAGNOSIS — R509 Fever, unspecified: Secondary | ICD-10-CM | POA: Insufficient documentation

## 2024-05-19 DIAGNOSIS — R110 Nausea: Secondary | ICD-10-CM | POA: Diagnosis not present

## 2024-05-19 DIAGNOSIS — I509 Heart failure, unspecified: Secondary | ICD-10-CM | POA: Diagnosis not present

## 2024-05-19 DIAGNOSIS — R109 Unspecified abdominal pain: Secondary | ICD-10-CM | POA: Insufficient documentation

## 2024-05-19 DIAGNOSIS — M549 Dorsalgia, unspecified: Secondary | ICD-10-CM | POA: Diagnosis not present

## 2024-05-19 LAB — URINALYSIS, ROUTINE W REFLEX MICROSCOPIC
Bilirubin Urine: NEGATIVE
Glucose, UA: NEGATIVE mg/dL
Ketones, ur: 5 mg/dL — AB
Nitrite: NEGATIVE
Protein, ur: 30 mg/dL — AB
Specific Gravity, Urine: 1.032 — ABNORMAL HIGH (ref 1.005–1.030)
pH: 5 (ref 5.0–8.0)

## 2024-05-19 LAB — CBC
HCT: 41.8 % (ref 36.0–46.0)
Hemoglobin: 13.2 g/dL (ref 12.0–15.0)
MCH: 25.9 pg — ABNORMAL LOW (ref 26.0–34.0)
MCHC: 31.6 g/dL (ref 30.0–36.0)
MCV: 82 fL (ref 80.0–100.0)
Platelets: 279 K/uL (ref 150–400)
RBC: 5.1 MIL/uL (ref 3.87–5.11)
RDW: 14.2 % (ref 11.5–15.5)
WBC: 5.7 K/uL (ref 4.0–10.5)
nRBC: 0 % (ref 0.0–0.2)

## 2024-05-19 LAB — COMPREHENSIVE METABOLIC PANEL WITH GFR
ALT: 17 U/L (ref 0–44)
AST: 20 U/L (ref 15–41)
Albumin: 3.8 g/dL (ref 3.5–5.0)
Alkaline Phosphatase: 82 U/L (ref 38–126)
Anion gap: 10 (ref 5–15)
BUN: 16 mg/dL (ref 6–20)
CO2: 21 mmol/L — ABNORMAL LOW (ref 22–32)
Calcium: 9 mg/dL (ref 8.9–10.3)
Chloride: 106 mmol/L (ref 98–111)
Creatinine, Ser: 0.8 mg/dL (ref 0.44–1.00)
GFR, Estimated: >60 mL/min (ref >60)
Glucose, Bld: 89 mg/dL (ref 70–99)
Potassium: 4 mmol/L (ref 3.5–5.1)
Sodium: 137 mmol/L (ref 135–145)
Total Bilirubin: 0.6 mg/dL (ref 0.0–1.2)
Total Protein: 7.1 g/dL (ref 6.5–8.1)

## 2024-05-19 LAB — LIPASE, BLOOD: Lipase: 31 U/L (ref 11–51)

## 2024-05-19 LAB — HCG, SERUM, QUALITATIVE: Preg, Serum: NEGATIVE

## 2024-05-19 NOTE — ED Triage Notes (Signed)
 Pt states this morning she woke with abdominal pain, fever and chills. Denies diarrhea, endorses nausea. Pt also complaining of back pain all over. Denies ever having kidney stones. Denies urinary symptoms at this time.

## 2024-05-20 ENCOUNTER — Emergency Department (HOSPITAL_COMMUNITY)

## 2024-05-20 DIAGNOSIS — R102 Pelvic and perineal pain: Secondary | ICD-10-CM | POA: Diagnosis not present

## 2024-05-20 MED ORDER — ONDANSETRON 4 MG PO TBDP: 4.0000 mg | ORAL_TABLET | Freq: Three times a day (TID) | ORAL | 0 refills | Status: DC | PRN

## 2024-05-20 MED ORDER — OXYCODONE-ACETAMINOPHEN 5-325 MG PO TABS: 2.0000 | ORAL_TABLET | ORAL | 0 refills | Status: DC | PRN

## 2024-05-20 MED ORDER — IOHEXOL 350 MG/ML SOLN
75.0000 mL | Freq: Once | INTRAVENOUS | Status: AC | PRN
  Administered 2024-05-20: 75 mL via INTRAVENOUS

## 2024-05-20 MED ORDER — LACTATED RINGERS IV BOLUS
1000.0000 mL | Freq: Once | INTRAVENOUS | Status: AC
  Administered 2024-05-20: 1000 mL via INTRAVENOUS

## 2024-05-20 MED ORDER — CEPHALEXIN 500 MG PO CAPS: 500.0000 mg | ORAL_CAPSULE | Freq: Four times a day (QID) | ORAL | 0 refills | Status: DC

## 2024-05-20 MED ORDER — ONDANSETRON HCL 4 MG/2ML IJ SOLN
4.0000 mg | Freq: Once | INTRAMUSCULAR | Status: AC
  Administered 2024-05-20: 4 mg via INTRAVENOUS
  Filled 2024-05-20: qty 2

## 2024-05-20 MED ORDER — SODIUM CHLORIDE 0.9 % IV SOLN
2.0000 g | Freq: Once | INTRAVENOUS | Status: AC
  Administered 2024-05-20: 2 g via INTRAVENOUS
  Filled 2024-05-20: qty 20

## 2024-05-20 MED ORDER — HYDROMORPHONE HCL 1 MG/ML IJ SOLN
1.0000 mg | Freq: Once | INTRAMUSCULAR | Status: AC
  Administered 2024-05-20: 1 mg via INTRAVENOUS
  Filled 2024-05-20: qty 1

## 2024-05-20 NOTE — ED Provider Notes (Signed)
 Emergency Department Provider Note  TRIAGE NOTE: Pt states this morning she woke with abdominal pain, fever and chills. Denies diarrhea, endorses nausea. Pt also complaining of back pain all over. Denies ever having kidney stones. Denies urinary symptoms at this time.   HISTORY  Chief Complaint Abdominal Pain   HPI Alexis Conley is a 23 y.o. female with  severe abdominal pain, back pain, fever, chills, and nausea that began suddenly this morning. The abdominal pain is localized to the lower abdomen and is tender to touch. The patient reports a fever reaching 100.15F but denies any urinary symptoms such as burning, frequency, or changes in urine color or smell. She is currently menstruating, with a normal flow, and denies any vaginal discharge or pain with intercourse. There is no history of similar symptoms in the past. The patient has no history of gallbladder or appendix surgery. Family history is notable for a grandmother who required gallbladder surgery. The history was obtained from the patient.  PMH Past Medical History:  Diagnosis Date   CHF (congestive heart failure) (HCC)    Eczema    Heart murmur    When younger   TB lung, latent     Home Medications Prior to Admission medications   Medication Sig Start Date End Date Taking? Authorizing Provider  cephALEXin  (KEFLEX ) 500 MG capsule Take 1 capsule (500 mg total) by mouth 4 (four) times daily. 05/20/24  Yes Niraj Kudrna, Selinda, MD  ondansetron  (ZOFRAN -ODT) 4 MG disintegrating tablet Take 1 tablet (4 mg total) by mouth every 8 (eight) hours as needed for vomiting. 05/20/24  Yes Kenny Stern, Selinda, MD  oxyCODONE -acetaminophen  (PERCOCET) 5-325 MG tablet Take 2 tablets by mouth every 4 (four) hours as needed. 05/20/24  Yes Korine Winton, MD  chlorhexidine (PERIDEX) 0.12 % solution Use as directed 5 mLs in the mouth or throat 3 (three) times daily. Patient not taking: Reported on 02/24/2024 10/23/23   [provider]  ibuprofen  (ADVIL )  400 MG tablet Take 400 mg by mouth 4 (four) times daily. Patient not taking: Reported on 02/24/2024 10/28/23   [provider]  ipratropium (ATROVENT ) 0.03 % nasal spray Place 2 sprays into both nostrils every 12 (twelve) hours. Patient not taking: Reported on 10/22/2023 09/01/23   Raspet, Erin K, PA-C  Lactic Ac-Citric Ac-Pot Bitart (PHEXXI ) 1.8-1-0.4 % GEL Place 1 applicator vaginally daily as needed (Prior to intercourse). Patient not taking: Reported on 04/14/2024 01/06/24   Nicholaus Almarie HERO, MD  methimazole  (TAPAZOLE ) 5 MG tablet Take 1 tablet (5 mg total) by mouth 3 (three) times daily. 03/29/24   Newlin, Enobong, MD  fluticasone  (FLONASE ) 50 MCG/ACT nasal spray Place 1-2 sprays into both nostrils daily. 10/05/20 02/22/21  Wieters, Hallie C, PA-C    Social History Social History   Tobacco Use   Smoking status: Never   Smokeless tobacco: Never  Vaping Use   Vaping status: Never Used  Substance Use Topics   Alcohol use: Not Currently    Comment: occ   Drug use: No    Review of Systems: Documented in HPI ____________________________________________  PHYSICAL EXAM: VITAL SIGNS: Triage: Blood pressure (!) 119/51, pulse 64, temperature 97.8 F (36.6 C), resp. rate 18, height 5' 1 (1.549 m), weight 84.4 kg, last menstrual period 05/19/2024, SpO2 100%.  Vitals:   05/20/24 0258 05/20/24 0621 05/20/24 0622 05/20/24 0715  BP: 119/75 (!) 119/51  107/67  Pulse: (!) 106  64 63  Resp: 18   16  Temp: 97.8 F (36.6 C)   (!)  97.5 F (36.4 C)  TempSrc:    Oral  SpO2: 96%  100% 100%  Weight:      Height:        Physical Exam Vitals and nursing note reviewed.  Constitutional:      Appearance: She is well-developed.  HENT:     Head: Normocephalic and atraumatic.  Cardiovascular:     Rate and Rhythm: Normal rate and regular rhythm.  Pulmonary:     Effort: No respiratory distress.     Breath sounds: No stridor.  Abdominal:     General: There is no distension.     Tenderness:  There is abdominal tenderness in the suprapubic area.  Musculoskeletal:     Cervical back: Normal range of motion.  Neurological:     Mental Status: She is alert.       ____________________________________________   LABS (all labs ordered are listed, but only abnormal results are displayed)  Labs Reviewed  URINE CULTURE - Abnormal; Notable for the following components:      Result Value   Culture   (*)    Value: <10,000 COLONIES/mL INSIGNIFICANT GROWTH Performed at [facility], [address] [place], [place] [address]    All other components within normal limits  COMPREHENSIVE METABOLIC PANEL WITH GFR - Abnormal; Notable for the following components:   CO2 21 (*)    All other components within normal limits  CBC - Abnormal; Notable for the following components:   MCH 25.9 (*)    All other components within normal limits  URINALYSIS, ROUTINE W REFLEX MICROSCOPIC - Abnormal; Notable for the following components:   APPearance CLOUDY (*)    Specific Gravity, Urine 1.032 (*)    Hgb urine dipstick LARGE (*)    Ketones, ur 5 (*)    Protein, ur 30 (*)    Leukocytes,Ua LARGE (*)    Bacteria, UA FEW (*)    All other components within normal limits  CULTURE, BLOOD (ROUTINE X 2)  CULTURE, BLOOD (ROUTINE X 2)  LIPASE, BLOOD  HCG, SERUM, QUALITATIVE   ____________________________________________  EKG   EKG Interpretation Date/Time:    Ventricular Rate:    PR Interval:    QRS Duration:    QT Interval:    QTC Calculation:   R Axis:      Text Interpretation:          ____________________________________________  RADIOLOGY  No results found.  ____________________________________________  PROCEDURES  Procedure(s) performed:   Procedures ____________________________________________  INITIAL IMPRESSION / ASSESSMENT AND PLAN   Initial Evaluation:  Patient has been experiencing severe abdominal pain, back pain, fever, chills, and nausea since this  morning. Plan:  Order medications Obtain a CT scan to evaluate for possible appendicitis or gallbladder issues Assess whether fever and other symptoms could be due to an infection or just a severe menstrual period, UTI/early pyelo  Initial DDx:        ED Course   CT scan without significant abnromalities.  Urine mildly contaminated, possibly infected, symptoms c/w UTI/eary pyelo so will treat pending culture, will add on US  to ensure no gyn emergencies although denies discharge, dyspareunia.  US  negative for reproductive abnormalities.       Images ordered viewed and obtained by myself. Agree with Radiology interpretation. Details in ED course.  Labs ordered reviewed by myself as detailed in ED course.  Consultations obtained/considered detailed in ED course.   FINAL IMPRESSION Final diagnoses:  Abdominal pain, unspecified abdominal location  Disposition  A medical screening exam was performed and I feel the patient has had an appropriate workup for their chief complaint at this time and likelihood of emergent condition existing is low. They have been counseled on decision, DISCHARGE, follow up and which symptoms necessitate immediate return to the emergency department. They or their family verbally stated understanding and agreement with plan and discharged in stable condition.   ____________________________________________   NEW OUTPATIENT MEDICATIONS STARTED DURING THIS VISIT:  Discharge Medication List as of 05/20/2024  7:44 AM     START taking these medications   Details  cephALEXin  (KEFLEX ) 500 MG capsule Take 1 capsule (500 mg total) by mouth 4 (four) times daily., Starting Thu 05/20/2024, Normal    ondansetron  (ZOFRAN -ODT) 4 MG disintegrating tablet Take 1 tablet (4 mg total) by mouth every 8 (eight) hours as needed for vomiting., Starting Thu 05/20/2024, Normal    oxyCODONE -acetaminophen  (PERCOCET) 5-325 MG tablet Take 2 tablets by mouth every 4 (four) hours as  needed., Starting Thu 05/20/2024, Normal        Note:  This note was prepared with assistance of Dragon voice recognition software. Occasional wrong-word or sound-a-like substitutions may have occurred due to the inherent limitations of voice recognition software.    Lorette Mayo, MD 05/29/24 [phone]

## 2024-05-20 NOTE — ED Notes (Signed)
 Patient transported to Ultrasound

## 2024-05-21 LAB — URINE CULTURE: Culture: <10000 — AB

## 2024-05-25 LAB — CULTURE, BLOOD (ROUTINE X 2)
Culture: NO GROWTH
Culture: NO GROWTH

## 2024-06-01 ENCOUNTER — Emergency Department (HOSPITAL_BASED_OUTPATIENT_CLINIC_OR_DEPARTMENT_OTHER)
Admission: EM | Admit: 2024-06-01 | Discharge: 2024-06-01 | Disposition: A | Discharge status: Home / Self Care | Attending: Emergency Medicine | Admitting: Emergency Medicine

## 2024-06-01 ENCOUNTER — Encounter (HOSPITAL_BASED_OUTPATIENT_CLINIC_OR_DEPARTMENT_OTHER): Payer: Self-pay | Admitting: Emergency Medicine

## 2024-06-01 ENCOUNTER — Other Ambulatory Visit (HOSPITAL_BASED_OUTPATIENT_CLINIC_OR_DEPARTMENT_OTHER): Payer: Self-pay

## 2024-06-01 ENCOUNTER — Other Ambulatory Visit: Payer: Self-pay

## 2024-06-01 DIAGNOSIS — Z91010 Allergy to peanuts: Secondary | ICD-10-CM | POA: Insufficient documentation

## 2024-06-01 DIAGNOSIS — R509 Fever, unspecified: Secondary | ICD-10-CM | POA: Diagnosis present

## 2024-06-01 DIAGNOSIS — U071 COVID-19: Secondary | ICD-10-CM | POA: Insufficient documentation

## 2024-06-01 LAB — RESP PANEL BY RT-PCR (RSV, FLU A&B, COVID)  RVPGX2
Influenza A by PCR: NEGATIVE
Influenza B by PCR: NEGATIVE
Resp Syncytial Virus by PCR: NEGATIVE
SARS Coronavirus 2 by RT PCR: POSITIVE — AB

## 2024-06-01 MED ORDER — ACETAMINOPHEN 500 MG PO TABS
1000.0000 mg | ORAL_TABLET | Freq: Once | ORAL | Status: AC
  Administered 2024-06-01: 1000 mg via ORAL
  Filled 2024-06-01: qty 2

## 2024-06-01 MED ORDER — ONDANSETRON 4 MG PO TBDP
4.0000 mg | ORAL_TABLET | Freq: Three times a day (TID) | ORAL | 0 refills | Status: DC | PRN
  Filled 2024-06-01: qty 20, 7d supply, fill #0

## 2024-06-01 MED ORDER — ONDANSETRON 4 MG PO TBDP
4.0000 mg | ORAL_TABLET | Freq: Once | ORAL | Status: AC
  Administered 2024-06-01: 4 mg via ORAL
  Filled 2024-06-01: qty 1

## 2024-06-01 NOTE — ED Notes (Signed)
 Pt discharged home after verbalizing understanding of discharge instructions; nad noted.

## 2024-06-01 NOTE — Discharge Instructions (Signed)
 Today you were seen for a COVID infection.  You may alternate taking Tylenol  and Motrin  as needed for fever and pain, Flonase as needed for nasal congestion, and plain Mucinex as needed for chest congestion.  You may return to work/school prior to the date listed on your excuse if you are fever free without Tylenol  or Motrin  for 24 hours and your symptoms are improving.  Thank you for letting us  treat you today. After reviewing your labs and imaging, I feel you are safe to go home. Please follow up with your PCP in the next several days and provide them with your records from this visit. Return to the Emergency Room if pain becomes severe or symptoms worsen.

## 2024-06-01 NOTE — ED Triage Notes (Signed)
 Pt via pov from home with sore throat, fever, chills, heaviness in chest. Pt denies and sob; lungs clear upon auscultation. Pt took cold & flu medication 2 hours PTA. Pt a&o x 4; nad noted.

## 2024-06-01 NOTE — ED Notes (Addendum)
 Called lab to check on covid swab, still in process

## 2024-06-01 NOTE — ED Provider Notes (Addendum)
 [place] EMERGENCY DEPARTMENT AT [facility] Provider Note   CSN: [id number] Arrival date & time: 06/01/24  1626     Patient presents with: Fever and Sore Throat   Alexis Conley is a 23 y.o. female presents today for 1 day of fever, chills, cough, congestion, headache, runny nose, and nausea.  Patient denies chest pain, shortness of breath, vomiting, abdominal pain, diplopia, tinnitus, any other complaints at this time.    Fever Associated symptoms: chills, congestion, cough, nausea and rhinorrhea   Sore Throat       Prior to Admission medications   Medication Sig Start Date End Date Taking? Authorizing Provider  ondansetron  (ZOFRAN -ODT) 4 MG disintegrating tablet Take 1 tablet (4 mg total) by mouth every 8 (eight) hours as needed for nausea or vomiting. 06/01/24  Yes Royalty Domagala N, PA-C  cephALEXin  (KEFLEX ) 500 MG capsule Take 1 capsule (500 mg total) by mouth 4 (four) times daily. 05/20/24   Mesner, Selinda, MD  chlorhexidine (PERIDEX) 0.12 % solution Use as directed 5 mLs in the mouth or throat 3 (three) times daily. Patient not taking: Reported on 02/24/2024 10/23/23   [provider]  ibuprofen  (ADVIL ) 400 MG tablet Take 400 mg by mouth 4 (four) times daily. Patient not taking: Reported on 02/24/2024 10/28/23   [provider]  ipratropium (ATROVENT ) 0.03 % nasal spray Place 2 sprays into both nostrils every 12 (twelve) hours. Patient not taking: Reported on 10/22/2023 09/01/23   Raspet, Erin K, PA-C  Lactic Ac-Citric Ac-Pot Bitart (PHEXXI ) 1.8-1-0.4 % GEL Place 1 applicator vaginally daily as needed (Prior to intercourse). Patient not taking: Reported on 04/14/2024 01/06/24   Nicholaus Almarie HERO, MD  methimazole  (TAPAZOLE ) 5 MG tablet Take 1 tablet (5 mg total) by mouth 3 (three) times daily. 03/29/24   Newlin, Enobong, MD  oxyCODONE -acetaminophen  (PERCOCET) 5-325 MG tablet Take 2 tablets by mouth every 4 (four) hours as needed. 05/20/24   Mesner, Selinda, MD   fluticasone  (FLONASE ) 50 MCG/ACT nasal spray Place 1-2 sprays into both nostrils daily. 10/05/20 02/22/21  Wieters, Hallie C, PA-C    Allergies: Peanut-containing drug products, Shellfish allergy, Tomato, and Mushroom extract complex (obsolete)    Review of Systems  Constitutional:  Positive for chills and fever.  HENT:  Positive for congestion and rhinorrhea.   Respiratory:  Positive for cough.   Gastrointestinal:  Positive for nausea.    Updated Vital Signs BP 114/72 (BP Location: Right Arm)   Pulse 100   Temp 99.1 F (37.3 C) (Oral)   Resp 18   Ht 5' 2 (1.575 m)   Wt 84.8 kg   LMP 05/19/2024 (Exact Date)   SpO2 98%   BMI 34.20 kg/m   Physical Exam Vitals and nursing note reviewed.  Constitutional:      General: She is not in acute distress.    Appearance: She is well-developed. She is not toxic-appearing or diaphoretic.  HENT:     Head: Normocephalic and atraumatic.     Nose: Congestion present.     Mouth/Throat:     Mouth: Mucous membranes are moist.     Pharynx: Oropharynx is clear.     Tonsils: No tonsillar exudate or tonsillar abscesses.  Eyes:     Conjunctiva/sclera: Conjunctivae normal.  Cardiovascular:     Rate and Rhythm: Regular rhythm. Tachycardia present.     Heart sounds: Normal heart sounds. No murmur heard. Pulmonary:     Effort: Pulmonary effort is normal. No respiratory distress.  Breath sounds: Normal breath sounds. No wheezing.  Abdominal:     Palpations: Abdomen is soft.     Tenderness: There is no abdominal tenderness.  Musculoskeletal:        General: No swelling.     Cervical back: Neck supple.  Skin:    General: Skin is warm and dry.     Capillary Refill: Capillary refill takes less than 2 seconds.  Neurological:     General: No focal deficit present.     Mental Status: She is alert and oriented to person, place, and time.  Psychiatric:        Mood and Affect: Mood normal.     (all labs ordered are listed, but only abnormal  results are displayed) Labs Reviewed  RESP PANEL BY RT-PCR (RSV, FLU A&B, COVID)  RVPGX2 - Abnormal; Notable for the following components:      Result Value   SARS Coronavirus 2 by RT PCR POSITIVE (*)    All other components within normal limits    EKG: None  Radiology: No results found.   Procedures   Medications Ordered in the ED  ondansetron  (ZOFRAN -ODT) disintegrating tablet 4 mg (4 mg Oral Given 06/01/24 1835)  acetaminophen  (TYLENOL ) tablet 1,000 mg (1,000 mg Oral Given 06/01/24 1835)                                    Medical Decision Making Risk OTC drugs. Prescription drug management.   This patient presents to the ED for concern of URI symptoms differential diagnosis includes COVID, flu, RSV, viral URI   Lab Tests:  I Ordered, and personally interpreted labs.  The pertinent results include: COVID-positive   Medicines ordered and prescription drug management:  I ordered medication including Zofran     I have reviewed the patients home medicines and have made adjustments as needed   Problem List / ED Course:  Considered for admission or further workup however patients vital signs, physical exam, and labs are reassuring. Patient's symptoms likely due to COVID infection. Patient advised to take Tylenol /Motrin  as needed for fever, Flonase  as needed for nasal congestion, and plain Mucinex as needed for chest congestion.  Prescribed Zofran  outpatient for nausea. Patient should follow-up with their primary care in the upcoming week if their symptoms persist for further evaluation and workup.      Final diagnoses:  COVID    ED Discharge Orders          Ordered    ondansetron  (ZOFRAN -ODT) 4 MG disintegrating tablet  Every 8 hours PRN        06/01/24 1814               Francis Ileana SAILOR, PA-C 06/01/24 1820    Francis Ileana SAILOR, PA-C 06/01/24 1918    Pamella Ozell LABOR, DO 06/10/24 [phone]

## 2024-06-02 ENCOUNTER — Other Ambulatory Visit (HOSPITAL_BASED_OUTPATIENT_CLINIC_OR_DEPARTMENT_OTHER): Payer: Self-pay

## 2024-06-07 NOTE — Progress Notes (Unsigned)
 [address] [place],  [place], [place] [address]  Office: [phone]  /  Fax: [phone]   Subjective   Initial Visit  Alexis Conley (MR# [id number]) is a 23 y.o. female who presents for evaluation and treatment of obesity and related comorbidities. Current BMI is There is no height or weight on file to calculate BMI. Alexis Conley has been struggling with her weight for many years and has been unsuccessful in either losing weight, maintaining weight loss, or reaching her healthy weight goal.  Alexis Conley is currently in the action stage of change and ready to dedicate time achieving and maintaining a healthier weight. Alexis Conley is interested in becoming our patient and working on intensive lifestyle modifications including (but not limited to) diet and exercise for weight loss.  Alexis Conley's weight issues began around 2020, coinciding with the COVID-19 pandemic. She maintained a healthy weight during high school but started gaining weight after graduation. She had a baby in 2023, which also contributed to her weight gain. She has attempted weight loss multiple times in the past, including programs like Weight Watchers, but is not currently following any specific diet or exercise regimen. Her highest non-pregnant weight was 192 pounds, and she currently weighs between 185 and 190 pounds. Her goal weight is between 135 and 140 pounds. She is interested in adopting healthier eating patterns and increasing her physical activity to improve her overall health and self-confidence.   She has been diagnosed with hyperthyroidism and has been on methimazole  for about a month. She is unsure if the medication has affected her weight. She is due for blood work to assess her thyroid  function.   She has a history of acute diastolic heart failure which is questionably related to viral illness(covid) and was followed by a heart failure clinic. Last echo 07/16/2022 LVEF 60-65% normal, no valvular abnormalities or evidence of  pericardial effusion.    She experiences fatigue and poor endurance at times, and occasionally goes long periods without eating due to her busy schedule. She reports mood changes related to her weight, feeling 'moody' but not depressed. She has experienced back pain, which she attributes to multiple factors, including her weight.   She has a family history of obesity and experiences moderate to high levels of stress. She used to go to the gym daily but has not returned since having her wisdom teeth removed in February. She works as an Ambulance person, which involves a sedentary job, and she occasionally relies on convenience foods due to her hectic lifestyle. Weight history:  When asked how their weight has affected their life and health, she states: {EMWeightAffected:28305}  When asked what else they would like to accomplish? She states: {EMHopetoaccomplish:28304::Adopt a healthier eating pattern and lifestyle,Improve energy levels and physical activity,Improve existing medical conditions,Improve quality of life}  She starting to note weight gain during : {emstartedtogainweight:31588}.  Life events associated with weight gain include : {emlifeeventsweighgain:31589}.   Other contributing factors: {EMcontributingfactors:28307}.  Their highest weight has been:  *** lbs.  Desired weight: ***  Previous weight-loss programs : {emweightlossprograms:31590::None}.  Their maximum weight loss was:  *** lbs.  Their greatest challenge with dieting: {emgreatestchallengediet:31593}.  Current or previous pharmacotherapy: {EM previousRx:28311}.  Response to medication: {EMResponsetomedication:28312}   Nutritional History:  Current nutrition plan: {EMNutritionplan:28309::None}.  How many times do you eat outside the home: {emfrequency:31645}  How often do they skip meals: {emskipmeals:31594::does not skip meals}  What beverages do they drink: {embeverages:31595}.   Use of  artificial sweetners : {Yes/No:30480221}  Food intolerances  or dislikes: {emfoodintolerance:31596::none}.  Food triggers: {emfoodtriggers:31600::None}.  Food cravings: {emfoodcravings:31601}  Do they struggle with excessive hunger or portion control : {YES/NO:21197}   Physical Activity:  Current level of physical activity: {EMcurrentPA:28310::None}  Barriers to Exercise: {embarrierstoexercise:32606::no barriers}   Past medical history includes:   Past Medical History:  Diagnosis Date   CHF (congestive heart failure) (HCC)    Eczema    Heart murmur    When younger   TB lung, latent      Objective   LMP 05/19/2024 (Exact Date)  She was weighed on the bioimpedance scale: There is no height or weight on file to calculate BMI.    Anthropometrics:  No data recorded No data recorded No data recorded No data recorded  Physical Exam:  General: She is overweight, cooperative, alert, well developed, and in no acute distress. PSYCH: Has normal mood, affect and thought process.   HEENT: EOMI, sclerae are anicteric. Lungs: Normal breathing effort, no conversational dyspnea. Extremities: No edema.  Neurologic: No gross sensory or motor deficits. No tremors or fasciculations noted.    Diagnostic Data Reviewed  EKG: Normal sinus rhythm, rate ***. No conduction abnormalities, abnormal Q waves or chamber enlargement.  Indirect Calorimeter completed today shows a VO2 of *** and a REE of ***.  Her calculated basal metabolic rate is *** thus her {zfprmzdlou:68397}.  Depression Screen  Shamiracle's PHQ-9 score was: ***.     04/14/2024    9:44 AM  Depression screen PHQ 2/9  Decreased Interest 2  Down, Depressed, Hopeless 0  PHQ - 2 Score 2  Altered sleeping 0  Tired, decreased energy 2  Change in appetite 2  Feeling bad or failure about yourself  0  Trouble concentrating 0  Moving slowly or fidgety/restless 0  Suicidal thoughts 0  PHQ-9 Score 6  Difficult doing  work/chores Not difficult at all    Screening for Sleep Related Breathing Disorders  Doyle {Actions; denies/reports/admits to:19208} daytime somnolence and {Actions; denies/reports/admits to:19208} waking up still tired. Patient has a history of symptoms of {OSA Sx:17850}. Kynlea generally gets {numbers (fuzzy):14653} hours of sleep per night, and states that she has {sleep quality:17851}. Snoring {is/are not:32546} present. Apneic episodes {is/are not:32546} present. Epworth Sleepiness Score is ***.   BMET    Component Value Date/Time   NA 137 05/19/2024 2312   NA 140 02/24/2024 1505   K 4.0 05/19/2024 2312   CL 106 05/19/2024 2312   CO2 21 (L) 05/19/2024 2312   GLUCOSE 89 05/19/2024 2312   BUN 16 05/19/2024 2312   BUN 15 02/24/2024 1505   CREATININE 0.80 05/19/2024 2312   CALCIUM 9.0 05/19/2024 2312   GFRNONAA >60 05/19/2024 2312   GFRAA >60 03/30/2020 1809   Lab Results  Component Value Date   HGBA1C 5.3 02/24/2024   HGBA1C 5.2 07/04/2021   No results found for: INSULIN  CBC    Component Value Date/Time   WBC 5.7 05/19/2024 2312   RBC 5.10 05/19/2024 2312   HGB 13.2 05/19/2024 2312   HGB 12.7 02/24/2024 1505   HCT 41.8 05/19/2024 2312   HCT 40.9 02/24/2024 1505   PLT 279 05/19/2024 2312   PLT 270 02/24/2024 1505   MCV 82.0 05/19/2024 2312   MCV 83 02/24/2024 1505   MCH 25.9 (L) 05/19/2024 2312   MCHC 31.6 05/19/2024 2312   RDW 14.2 05/19/2024 2312   RDW 14.4 02/24/2024 1505   Iron/TIBC/Ferritin/ %Sat No results found for: IRON, TIBC, FERRITIN, IRONPCTSAT Lipid Panel  No results  found for: CHOL, TRIG, HDL, CHOLHDL, VLDL, LDLCALC, LDLDIRECT Hepatic Function Panel     Component Value Date/Time   PROT 7.1 05/19/2024 2312   PROT 7.1 02/24/2024 1505   ALBUMIN 3.8 05/19/2024 2312   ALBUMIN 4.3 02/24/2024 1505   AST 20 05/19/2024 2312   ALT 17 05/19/2024 2312   ALKPHOS 82 05/19/2024 2312   BILITOT 0.6 05/19/2024 2312   BILITOT <0.2  02/24/2024 1505   BILIDIR <0.1 01/09/2022 0035   IBILI NOT CALCULATED 01/09/2022 0035      Component Value Date/Time   TSH 0.161 (L) 03/25/2024 1414     Assessment and Plan   TREATMENT PLAN FOR OBESITY:  Recommended Dietary Goals  Kristal is currently in the action stage of change. As such, her goal is to implement medically supervised obesity management plan.  She has agreed to implement: {emwtlossplannewint:31639}  Behavioral Intervention  We discussed the following Behavioral Modification Strategies today: {EMwtlossstrategiesnewint:31640::increasing lean protein intake to established goals,decreasing simple carbohydrates ,increasing vegetables,increasing lower glycemic fruits,increasing fiber rich foods,avoiding skipping meals,increasing water intake,work on meal planning and preparation,reading food labels ,keeping healthy foods at home,identifying sources and decreasing liquid calories,decreasing eating out or consumption of processed foods, and making healthy choices when eating convenient foods,planning for success,better snacking choices}  Additional resources provided today: {emadditionalresourcesnewint:32116::Handout on healthy eating and balanced plate,Handout on complex carbohydrates and lean sources of protein,Handout principles of weight management}  Recommended Physical Activity Goals  Chevi has been advised to work up to 150 minutes of moderate intensity aerobic activity a week and strengthening exercises 2-3 times per week for cardiovascular health, weight loss maintenance and preservation of muscle mass.   She has agreed to :  {EMEXERCISE:28847::Think about enjoyable ways to increase daily physical activity and overcoming barriers to exercise,Increase physical activity in their day and reduce sedentary time (increase NEAT).,Increase volume of physical activity to a goal of 240 minutes a week,Combine aerobic and strengthening  exercises for efficiency and improved cardiometabolic health.}  Medical Interventions and Pharmacotherapy We will work on building a Therapist, art and behavioral strategies. We will discuss the role of pharmacotherapy as an adjunct at subsequent visits.   ASSOCIATED CONDITIONS ADDRESSED TODAY  Other Fatigue ***. Bobbijo does feel that her weight is causing her energy to be lower than it should be. Fatigue may be related to obesity, depression or many other causes. Labs will be ordered, and in the meanwhile, Nicolle will focus on self care including making healthy food choices, increasing physical activity and focusing on stress reduction.  Shortness of Breath Kimmarie notes increasing shortness of breath with physical activity and seems to be worsening over time with weight gain. She notes getting out of breath sooner with activity than she used to. This has not gotten worse recently. Laisa denies shortness of breath at rest or orthopnea.  There are no diagnoses linked to this encounter.  Follow-up  She was informed of the importance of frequent follow-up visits to maximize her success with intensive lifestyle modifications for her multiple health conditions. She was informed we would discuss her lab results at her next visit unless there is a critical issue that needs to be addressed sooner. Donnamae agreed to keep her next visit at the agreed upon time to discuss these results.  Attestation Statement  This is the patient's intake visit at [facility]. The patient's Health Questionnaire was reviewed at length. Included in the packet: current and past health history, medications, allergies, ROS, gynecologic history (women only), surgical  history, family history, social history, weight history, weight loss surgery history (for those that have had weight loss surgery), nutritional evaluation, mood and food questionnaire, PHQ9, Epworth questionnaire, sleep habits  questionnaire, patient life and health improvement goals questionnaire. These will all be scanned into the patient's chart under media.   During the visit, I independently reviewed the patient's EKG***, previous labs, bioimpedance scale results, and indirect calorimetry results. I used this information to medically tailor a meal plan for the patient that will help her to lose weight and will improve her obesity-related conditions. I performed a medically necessary appropriate examination and/or evaluation. I discussed the assessment and treatment plan with the patient. The patient was provided an opportunity to ask questions and all were answered. The patient agreed with the plan and demonstrated an understanding of the instructions. Labs were ordered at this visit and will be reviewed at the next visit unless critical results need to be addressed immediately. Clinical information was updated and documented in the EMR.   In addition, they received basic education on identification of processed foods and reduction of these, different sources of lean proteins and complex carbohydrates and how to eat balanced by incorporation of whole foods.  Reviewed by clinician on day of visit: allergies, medications, problem list, medical history, surgical history, family history, social history, and previous encounter notes.  I have spent *** minutes in the care of the patient today including: {NUMBER 1-10:22536} minutes before the visit reviewing and preparing the chart. *** minutes face-to-face {emfacetoface:32598::assessing and reviewing listed medical problems as outlined in obesity care plan,providing nutritional and behavioral counseling on topics outlined in the obesity care plan,independently interpreting test results and goals of care, as described in assessment and plan,reviewing and discussing biometric information and progress} {NUMBER 1-10:22536} minutes after the visit updating chart and  documentation of encounter.       Arvie Bartholomew ANP-C

## 2024-06-08 ENCOUNTER — Other Ambulatory Visit (HOSPITAL_BASED_OUTPATIENT_CLINIC_OR_DEPARTMENT_OTHER): Payer: Self-pay

## 2024-06-08 ENCOUNTER — Ambulatory Visit (INDEPENDENT_AMBULATORY_CARE_PROVIDER_SITE_OTHER): Admitting: Nurse Practitioner

## 2024-06-08 ENCOUNTER — Encounter (INDEPENDENT_AMBULATORY_CARE_PROVIDER_SITE_OTHER): Payer: Self-pay | Admitting: Nurse Practitioner

## 2024-06-08 VITALS — BP 107/70 | HR 63 | Temp 97.9°F | Ht 61.0 in | Wt 183.0 lb

## 2024-06-08 DIAGNOSIS — Z8679 Personal history of other diseases of the circulatory system: Secondary | ICD-10-CM | POA: Diagnosis not present

## 2024-06-08 DIAGNOSIS — I5031 Acute diastolic (congestive) heart failure: Secondary | ICD-10-CM | POA: Diagnosis not present

## 2024-06-08 DIAGNOSIS — E0590 Thyrotoxicosis, unspecified without thyrotoxic crisis or storm: Secondary | ICD-10-CM | POA: Diagnosis not present

## 2024-06-08 DIAGNOSIS — R0602 Shortness of breath: Secondary | ICD-10-CM

## 2024-06-08 DIAGNOSIS — Z1331 Encounter for screening for depression: Secondary | ICD-10-CM | POA: Diagnosis not present

## 2024-06-08 DIAGNOSIS — Z6835 Body mass index (BMI) 35.0-35.9, adult: Secondary | ICD-10-CM

## 2024-06-08 DIAGNOSIS — R5383 Other fatigue: Secondary | ICD-10-CM

## 2024-06-08 DIAGNOSIS — E66812 Obesity, class 2: Secondary | ICD-10-CM

## 2024-06-09 ENCOUNTER — Ambulatory Visit (INDEPENDENT_AMBULATORY_CARE_PROVIDER_SITE_OTHER): Payer: Self-pay | Admitting: Nurse Practitioner

## 2024-06-09 LAB — BASIC METABOLIC PANEL WITH GFR
BUN/Creatinine Ratio: 17 (ref 9–23)
BUN: 11 mg/dL (ref 6–20)
CO2: 19 mmol/L — ABNORMAL LOW (ref 20–29)
Calcium: 9.2 mg/dL (ref 8.7–10.2)
Chloride: 104 mmol/L (ref 96–106)
Creatinine, Ser: 0.66 mg/dL (ref 0.57–1.00)
Glucose: 74 mg/dL (ref 70–99)
Potassium: 4.5 mmol/L (ref 3.5–5.2)
Sodium: 140 mmol/L (ref 134–144)
eGFR: 125 mL/min/1.73 (ref >59)

## 2024-06-09 LAB — LIPID PANEL
Chol/HDL Ratio: 3.7 ratio (ref 0.0–4.4)
Cholesterol, Total: 163 mg/dL (ref 100–199)
HDL: 44 mg/dL (ref >39)
LDL Chol Calc (NIH): 108 mg/dL — ABNORMAL HIGH (ref 0–99)
Triglycerides: 54 mg/dL (ref 0–149)
VLDL Cholesterol Cal: 11 mg/dL (ref 5–40)

## 2024-06-09 LAB — INSULIN, RANDOM: INSULIN: 6.4 u[IU]/mL (ref 2.6–24.9)

## 2024-06-17 NOTE — Addendum Note (Signed)
 Addended by: DELORES CASTOR A on: 06/17/2024 01:46 PM   Modules accepted: Orders

## 2024-06-22 ENCOUNTER — Encounter (INDEPENDENT_AMBULATORY_CARE_PROVIDER_SITE_OTHER): Payer: Self-pay | Admitting: Nurse Practitioner

## 2024-06-22 ENCOUNTER — Ambulatory Visit (INDEPENDENT_AMBULATORY_CARE_PROVIDER_SITE_OTHER): Admitting: Nurse Practitioner

## 2024-06-22 VITALS — BP 101/68 | HR 68 | Temp 98.1°F | Ht 61.0 in | Wt 181.0 lb

## 2024-06-22 DIAGNOSIS — E559 Vitamin D deficiency, unspecified: Secondary | ICD-10-CM

## 2024-06-22 DIAGNOSIS — E0590 Thyrotoxicosis, unspecified without thyrotoxic crisis or storm: Secondary | ICD-10-CM

## 2024-06-22 DIAGNOSIS — Z6834 Body mass index (BMI) 34.0-34.9, adult: Secondary | ICD-10-CM

## 2024-06-22 DIAGNOSIS — E66811 Obesity, class 1: Secondary | ICD-10-CM

## 2024-06-22 DIAGNOSIS — E7800 Pure hypercholesterolemia, unspecified: Secondary | ICD-10-CM

## 2024-06-22 DIAGNOSIS — R87610 Atypical squamous cells of undetermined significance on cytologic smear of cervix (ASC-US): Secondary | ICD-10-CM

## 2024-06-22 NOTE — Progress Notes (Signed)
 Office: [phone]  /  Fax: [phone]  WEIGHT SUMMARY AND BIOMETRICS  Weight Lost Since Last Visit: 2lb  Weight Gained Since Last Visit: 0lb   Vitals Temp: 98.1 F (36.7 C) BP: 101/68 Pulse Rate: 68 SpO2: 99 %   Anthropometric Measurements Height: 5' 1 (1.549 m) Weight: 181 lb (82.1 kg) BMI (Calculated): 34.22 Weight at Last Visit: 183lb Weight Lost Since Last Visit: 2lb Weight Gained Since Last Visit: 0lb Starting Weight: 183lb Total Weight Loss (lbs): 2 lb (0.907 kg) Peak Weight: 186lb Waist Measurement : 38 inches   Body Composition  Body Fat %: 41.5 % Fat Mass (lbs): 75.2 lbs Muscle Mass (lbs): 100.4 lbs Total Body Water (lbs): 74.2 lbs Visceral Fat Rating : 8   Other Clinical Data RMR: [id number] Fasting: No Labs: no Today's Visit #: 2 Starting Date: 06/08/24    Total Weight Loss: 2 pounds Bio Impedance Data reviewed with patient:up 0.6 pounds muscle and down 3 pounds of adipose  HPI  Chief Complaint: OBESITY  Alexis Conley is here to discuss her progress with her obesity treatment plan. She is on the the Category 1 Plan and states she is following her eating plan approximately 80 % of the time. She states she is not currently exercising.   Interval History:  Since last office visit she did well on her meal plan.  She is trying to incorporate more whole foods.  She did have more cravings over the weekend with her period.  She is trying to drink more water. She was hitting her protein goals of 80 grams/day.  Has been trying to not skip meals.  She does drink diet soda 4 times a week. She has been packing her lunch. She has not been exercising as of yet.  She is trying to get with her friend group and go to [employer] together Sunday- Thursday 6:30-8 pm.  I ordered thyroid  panel at last visit but accidentally ordered under clinic collect- will get today and send results to Dr. Newlin. She is out of methimazole  so has not been taking.    Cancer  Screenings Pap: 03/25/24 ASCUS- she will call to follow up  PHYSICAL EXAM:  Blood pressure 101/68, pulse 68, temperature 98.1 F (36.7 C), height 5' 1 (1.549 m), weight 181 lb (82.1 kg), last menstrual period 05/19/2024, SpO2 99%. Body mass index is 34.2 kg/m.  General: Well Developed, well nourished, and in no acute distress.  HEENT: Normocephalic, atraumatic; EOMI, sclerae are anicteric. Skin: Warm and dry, good turgor Chest:  Normal excursion, shape, no gross ABN Respiratory: No conversational dyspnea; speaking in full sentences NeuroM-Sk:  Normal gross ROM * 4 extremities  Psych: A and O X 3, insight adequate, mood- full    DIAGNOSTIC DATA REVIEWED:  ALL labs were reviewed with patient in detail Last metabolic panel Lab Results  Component Value Date   GLUCOSE 74 06/08/2024   NA 140 06/08/2024   K 4.5 06/08/2024   CL 104 06/08/2024   CO2 19 (L) 06/08/2024   BUN 11 06/08/2024   CREATININE 0.66 06/08/2024   EGFR 125 06/08/2024   CALCIUM 9.2 06/08/2024   PROT 7.1 05/19/2024   ALBUMIN 3.8 05/19/2024   LABGLOB 2.8 02/24/2024   AGRATIO 1.5 12/18/2021   BILITOT 0.6 05/19/2024   ALKPHOS 82 05/19/2024   AST 20 05/19/2024   ALT 17 05/19/2024   ANIONGAP 10 05/19/2024  Glucose, electrolytes, liver and kidney functions are normal  Lab Results  Component Value Date   HGBA1C 5.3  02/24/2024   HGBA1C 5.2 07/04/2021  A1c is normal  Lab Results  Component Value Date   INSULIN  6.4 06/08/2024  Insulin  is normal  Lab Results  Component Value Date   TSH 0.161 (L) 03/25/2024  She is to be taking methimazole  5 mg TID and have repeat labs  CBC    Component Value Date/Time   WBC 5.7 05/19/2024 2312   RBC 5.10 05/19/2024 2312   HGB 13.2 05/19/2024 2312   HGB 12.7 02/24/2024 1505   HCT 41.8 05/19/2024 2312   HCT 40.9 02/24/2024 1505   PLT 279 05/19/2024 2312   PLT 270 02/24/2024 1505   MCV 82.0 05/19/2024 2312   MCV 83 02/24/2024 1505   MCH 25.9 (L) 05/19/2024 2312    MCHC 31.6 05/19/2024 2312   RDW 14.2 05/19/2024 2312   RDW 14.4 02/24/2024 1505  Blood count is normal  Lipid Panel     Component Value Date/Time   CHOL 163 06/08/2024 1040   TRIG 54 06/08/2024 1040   HDL 44 06/08/2024 1040   CHOLHDL 3.7 06/08/2024 1040   LDLCALC 108 (H) 06/08/2024 1040  Pure hypercholesterolemia- no current medication     ASSESSMENT AND PLAN  TREATMENT PLAN FOR OBESITY:  Recommended Dietary Goals  Alexis Conley is currently in the action stage of change. As such, her goal is to continue weight management plan. She has agreed to the Category 1 Plan.  Behavioral Intervention  We discussed the following Behavioral Modification Strategies today: continue to work on maintaining a reduced calorie state, getting the recommended amount of protein, incorporating whole foods, making healthy choices, staying well hydrated and practicing mindfulness when eating. and increase protein intake, fibrous foods (25 grams per day for women, 30 grams for men) and water to improve satiety and decrease hunger signals. .  Additional resources provided today: NA  Recommended Physical Activity Goals  Alexis Conley has been advised to work up to 150 minutes of moderate intensity aerobic activity a week and strengthening exercises 2-3 times per week for cardiovascular health, weight loss maintenance and preservation of muscle mass.   She has agreed to Think about enjoyable ways to increase daily physical activity and overcoming barriers to exercise, Increase physical activity in their day and reduce sedentary time (increase NEAT)., Increase volume of physical activity to a goal of 240 minutes a week, and Combine aerobic and strengthening exercises for efficiency and improved cardiometabolic health.   Pharmacotherapy We discussed various medication options to help Alexis Conley with her weight loss efforts and we both agreed to continue working on nutrition and behavior modification.  ASSOCIATED  CONDITIONS ADDRESSED TODAY  Action/Plan  Vitamin D deficiency If Vit D level remains low will supplement with Ergocalciferol 50000 units once a week for 12 weeks and then recheck level.   -     VITAMIN D 25 Hydroxy (Vit-D Deficiency, Fractures)  Hyperthyroidism She is currently out of medication but was previously on methimazole  5 mg 3 tabs daily. Due to have labs redrawn, will have results sent to PCP Dr. Newlin. Was ordered wrong at last visit so did not get results,  -     Thyroid  Panel With TSH  Pure hypercholesterolemia Focus on implementing category 1 meal plan, limit saturated fats Loss of 10-15% body weight can improve lipid levels Focus on getting 240 minutes a week of moderate to high intensity exercise with strength training.    Class 1 obesity with serious comorbidity and body mass index (BMI) of 34.0 to 34.9 in adult,  unspecified obesity type See plan above Encouraged to start cardio and strength training and she has set a goal to go 5 times a week to [employer] [place] for 1.5 hours -     Vitamin B12 -     VITAMIN D 25 Hydroxy (Vit-D Deficiency, Fractures)   ASCUS of cervix with negative high risk HPV       Strongly encouraged to schedule follow up with GYN       Counseled on risks of abnormal pap smear.       Return in about 3 weeks (around 07/13/2024).Alexis Conley She was informed of the importance of frequent follow up visits to maximize her success with intensive lifestyle modifications for her multiple health conditions.   ATTESTASTION STATEMENTS:  Reviewed by clinician on day of visit: allergies, medications, problem list, medical history, surgical history, family history, social history, and previous encounter notes.   I personally spent a total of 30 minutes in the care of the patient today including preparing to see the patient, getting/reviewing separately obtained history, performing a medically appropriate exam/evaluation, counseling and educating, placing  orders, referring and communicating with other health care professionals, documenting clinical information in the EHR, independently interpreting results, communicating results, and coordinating care.   Deshane Cotroneo ANP-C

## 2024-06-23 ENCOUNTER — Ambulatory Visit (INDEPENDENT_AMBULATORY_CARE_PROVIDER_SITE_OTHER): Payer: Self-pay | Admitting: Nurse Practitioner

## 2024-06-23 LAB — THYROID PANEL WITH TSH
Free Thyroxine Index: 2.5 (ref 1.2–4.9)
T3 Uptake Ratio: 31 % (ref 24–39)
T4, Total: 8.1 ug/dL (ref 4.5–12.0)
TSH: 0.299 u[IU]/mL — ABNORMAL LOW (ref 0.450–4.500)

## 2024-06-23 LAB — VITAMIN D 25 HYDROXY (VIT D DEFICIENCY, FRACTURES): Vit D, 25-Hydroxy: 20.6 ng/mL — ABNORMAL LOW (ref 30.0–100.0)

## 2024-06-23 LAB — VITAMIN B12: Vitamin B-12: 696 pg/mL (ref 232–1245)

## 2024-06-23 NOTE — Progress Notes (Signed)
 I drew thyroid  labs on your patient today with some of my other labwork.  I wanted to send to you for methimazole  adjustment

## 2024-06-25 ENCOUNTER — Other Ambulatory Visit: Payer: Self-pay | Admitting: Family Medicine

## 2024-06-25 MED ORDER — METHIMAZOLE 10 MG PO TABS: 10.0000 mg | ORAL_TABLET | Freq: Two times a day (BID) | ORAL | 1 refills | Status: AC

## 2024-07-06 ENCOUNTER — Telehealth: Payer: Self-pay | Admitting: Family Medicine

## 2024-07-06 NOTE — Telephone Encounter (Signed)
 Patient called to say she needed to speak to her provider about her pap smear results. Request a call back.

## 2024-07-13 ENCOUNTER — Encounter: Payer: Self-pay | Admitting: Family Medicine

## 2024-07-13 ENCOUNTER — Ambulatory Visit

## 2024-07-14 ENCOUNTER — Ambulatory Visit

## 2024-07-15 ENCOUNTER — Encounter (INDEPENDENT_AMBULATORY_CARE_PROVIDER_SITE_OTHER): Payer: Self-pay | Admitting: Nurse Practitioner

## 2024-07-15 ENCOUNTER — Ambulatory Visit (INDEPENDENT_AMBULATORY_CARE_PROVIDER_SITE_OTHER): Admitting: Nurse Practitioner

## 2024-07-15 VITALS — BP 103/71 | HR 52 | Temp 98.1°F | Ht 61.0 in | Wt 178.0 lb

## 2024-07-15 DIAGNOSIS — E559 Vitamin D deficiency, unspecified: Secondary | ICD-10-CM

## 2024-07-15 DIAGNOSIS — E7800 Pure hypercholesterolemia, unspecified: Secondary | ICD-10-CM

## 2024-07-15 DIAGNOSIS — E66811 Obesity, class 1: Secondary | ICD-10-CM

## 2024-07-15 DIAGNOSIS — E0590 Thyrotoxicosis, unspecified without thyrotoxic crisis or storm: Secondary | ICD-10-CM | POA: Diagnosis not present

## 2024-07-15 DIAGNOSIS — Z6834 Body mass index (BMI) 34.0-34.9, adult: Secondary | ICD-10-CM | POA: Diagnosis not present

## 2024-07-15 MED ORDER — VITAMIN D (ERGOCALCIFEROL) 1.25 MG (50000 UNIT) PO CAPS: 50000.0000 [IU] | ORAL_CAPSULE | ORAL | 1 refills | Status: DC

## 2024-07-15 NOTE — Progress Notes (Signed)
 Office: [phone]  /  Fax: [phone]  WEIGHT SUMMARY AND BIOMETRICS  Weight Lost Since Last Visit: 3 lb  Weight Gained Since Last Visit: 0   Vitals Temp: 98.1 F (36.7 C) BP: 103/71 Pulse Rate: (!) 52 SpO2: 99 %   Anthropometric Measurements Height: 5' 1 (1.549 m) Weight: 178 lb (80.7 kg) BMI (Calculated): 33.65 Weight at Last Visit: 181 lb Weight Lost Since Last Visit: 3 lb Weight Gained Since Last Visit: 0 Starting Weight: 183 lb Total Weight Loss (lbs): 5 lb (2.268 kg) Peak Weight: 186 lb   Body Composition  Body Fat %: 40.6 % Fat Mass (lbs): 72.6 lbs Muscle Mass (lbs): 100.8 lbs Total Body Water (lbs): 72.6 lbs Visceral Fat Rating : 7   Other Clinical Data Fasting: No Labs: No Today's Visit #: 3 Starting Date: 06/08/24    Total Weight Loss:5 pounds Percent of body weight lost: 2.7% Bio Impedance Data reviewed with patient: Muscle is up 0.4 pounds, adipose is down 2.6 pounds. Visceral fat rating has decreased from 8 to 7 and PBF has decreased from 41.5% to 40.6%  HPI  Chief Complaint: OBESITY  Alexis Conley is here to discuss her progress with her obesity treatment plan. She is on the the Category 1 Plan and states she is following her eating plan approximately 5 % of the time. She states she is not exercising.   Interval History:  Since last office visit she had set a goal of 5 times a week for 90 minutes to go to [employer] at her last visit. She has a lot of stress at work, they lost 3 people in the past 2-3 weeks. She has a lot of work drama.  Went to a concert and did stay on plan. 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  She has not been sticking to meal plan, has skipped several meals.  She is going to focus on protein 80 grams daily and water 64 ounces daily She has not restarted Tapazole  10 mg BID for hyperthyroidism and follows with her PCP. She has  picked up the medication but not started taking the medication. Last TSH showed persistent hypothyroidism and had been out of medication at lab draw last visit. She is working on nutrition, exercise and weight loss for pure hypercholesterolemia. Vit D has shown deficiency on last lab evaluation and would benefit from supplementation with ergocalciferol 50000 units once a week.   PHYSICAL EXAM:  Blood pressure 103/71, pulse (!) 52, temperature 98.1 F (36.7 C), height 5' 1 (1.549 m), weight 178 lb (80.7 kg), SpO2 99%. Body mass index is 33.63 kg/m.  General: Well Developed, well nourished, and in no acute distress.  HEENT: Normocephalic, atraumatic; EOMI, sclerae are anicteric. Skin: Warm and dry, good turgor Chest:  Normal excursion, shape, no gross ABN Respiratory: No conversational dyspnea; speaking in full sentences NeuroM-Sk:  Normal gross ROM * 4 extremities  Psych: A and O X 3, insight adequate, mood- full    DIAGNOSTIC DATA REVIEWED:     Component Value Date/Time   TSH 0.299 (L) 06/22/2024 1449   Nutritional Lab Results  Component Value Date   VD25OH 20.6 (L) 06/22/2024     ASSESSMENT AND PLAN  Class 1 obesity with serious comorbidity and body mass index (BMI) of 34.0 to 34.9 in adult, unspecified obesity type TREATMENT PLAN FOR OBESITY:  Recommended Dietary Goals  Maghan is currently in the action  stage of change. As such, her goal is to continue weight management plan. She has agreed to the Category 1 Plan.  Behavioral Intervention  We discussed the following Behavioral Modification Strategies today: increasing lean protein intake to established goals, increasing water intake , and continue to work on maintaining a reduced calorie state, getting the recommended amount of protein, incorporating whole foods, making healthy choices, staying well hydrated and practicing mindfulness when eating.. Since she is very stressed at work will mainly focus on getting 80  grams of protein and 64-90 ounces of water daily  Recommended Physical Activity Goals  Jayli has been advised to work up to 150 minutes of moderate intensity aerobic activity a week and strengthening exercises 2-3 times per week for cardiovascular health, weight loss maintenance and preservation of muscle mass.   She has agreed to Think about enjoyable ways to increase daily physical activity and overcoming barriers to exercise, Increase physical activity in their day and reduce sedentary time (increase NEAT)., and Start aerobic activity with a goal of 150 minutes a week at moderate intensity.    Pharmacotherapy We discussed various medication options to help Kalyani with her weight loss efforts and we both agreed to start Ergocalciferol 50000 units once a week for vitamin D deficiency.  ASSOCIATED CONDITIONS ADDRESSED TODAY  Action/Plan  Vitamin D deficiency Supplement with Ergocalciferol 50000 units once a week for 12 weeks and then recheck level.   -     Vitamin D (Ergocalciferol); Take 1 capsule (50,000 Units total) by mouth every 7 (seven) days.  Dispense: 5 capsule; Refill: 1  Hyperthyroidism       Strongly encouraged to start Tapazole  10 mg BID- she has picked up the medication but not started.  Counseled on dangers of untreated hyperthyroidism- plans to restart today.   Pure hypercholesterolemia Continue implementing category 1 meal plan, limit saturated fats Loss of 10-15% body weight can improve lipid levels         Return in about 4 weeks (around 08/12/2024).SABRA She was informed of the importance of frequent follow up visits to maximize her success with intensive lifestyle modifications for her multiple health conditions.   ATTESTASTION STATEMENTS:  Reviewed by clinician on day of visit: allergies, medications, problem list, medical history, surgical history, family history, social history, and previous encounter notes.   I personally spent a total of 30 minutes in the  care of the patient today including preparing to see the patient, getting/reviewing separately obtained history, performing a medically appropriate exam/evaluation, counseling and educating, placing orders, documenting clinical information in the EHR, communicating results, and coordinating care.   Karlis Cregg ANP-C

## 2024-07-23 ENCOUNTER — Ambulatory Visit

## 2024-07-23 VITALS — Wt 183.6 lb

## 2024-07-23 DIAGNOSIS — Z23 Encounter for immunization: Secondary | ICD-10-CM

## 2024-07-23 NOTE — Progress Notes (Signed)
 [name] here for Gardasil  Injection.  Injection administered without complication. Patient will return in 3 months for next injection. Pt denies any further questions or concerns at this time.   Cyndee JAYSON Molt, RN 07/23/2024  9:50 AM

## 2024-07-28 ENCOUNTER — Other Ambulatory Visit: Payer: Self-pay

## 2024-07-28 ENCOUNTER — Ambulatory Visit

## 2024-07-28 ENCOUNTER — Other Ambulatory Visit (HOSPITAL_COMMUNITY)
Admission: RE | Admit: 2024-07-28 | Discharge: 2024-07-28 | Disposition: A | Discharge status: Home / Self Care | Source: Ambulatory Visit | Attending: Family Medicine | Admitting: Family Medicine

## 2024-07-28 VITALS — BP 112/62 | HR 68 | Ht 62.0 in | Wt 184.1 lb

## 2024-07-28 DIAGNOSIS — Z9189 Other specified personal risk factors, not elsewhere classified: Secondary | ICD-10-CM

## 2024-07-28 DIAGNOSIS — N898 Other specified noninflammatory disorders of vagina: Secondary | ICD-10-CM | POA: Insufficient documentation

## 2024-07-28 NOTE — Progress Notes (Signed)
 Pt presents with report of vaginal discharge with odor x1 month. She denies itching or irritation. Self swab obtained and she would also like testing for STI's. She will be notified of results and any treatment indicated via Mychart. Pt voiced understanding.

## 2024-07-29 ENCOUNTER — Ambulatory Visit: Payer: Self-pay | Admitting: Obstetrics and Gynecology

## 2024-07-29 DIAGNOSIS — B9689 Other specified bacterial agents as the cause of diseases classified elsewhere: Secondary | ICD-10-CM

## 2024-07-29 LAB — CERVICOVAGINAL ANCILLARY ONLY
Bacterial Vaginitis (gardnerella): POSITIVE — AB
Candida Glabrata: NEGATIVE
Candida Vaginitis: NEGATIVE
Chlamydia: NEGATIVE
Comment: NEGATIVE
Comment: NEGATIVE
Comment: NEGATIVE
Comment: NEGATIVE
Comment: NEGATIVE
Comment: NORMAL
Neisseria Gonorrhea: NEGATIVE
Trichomonas: NEGATIVE

## 2024-07-29 MED ORDER — METRONIDAZOLE 500 MG PO TABS: 500.0000 mg | ORAL_TABLET | Freq: Two times a day (BID) | ORAL | 0 refills | Status: AC

## 2024-08-16 ENCOUNTER — Ambulatory Visit (INDEPENDENT_AMBULATORY_CARE_PROVIDER_SITE_OTHER): Payer: Self-pay | Admitting: Nurse Practitioner

## 2024-08-24 ENCOUNTER — Other Ambulatory Visit (INDEPENDENT_AMBULATORY_CARE_PROVIDER_SITE_OTHER): Payer: Self-pay | Admitting: Nurse Practitioner

## 2024-08-24 DIAGNOSIS — E559 Vitamin D deficiency, unspecified: Secondary | ICD-10-CM

## 2024-08-28 DIAGNOSIS — N898 Other specified noninflammatory disorders of vagina: Secondary | ICD-10-CM | POA: Diagnosis not present

## 2024-08-28 NOTE — Progress Notes (Signed)
 Subjective Patient ID: Alexis Conley is a 23 y.o. female.  Chief Complaint  Patient presents with   Vaginal Discharge    Patient states she was taking meds for BV along with other medications for her thyroid . States she is not sure if the BV cleared or if she has yeast infection.    The following information was reviewed by members of the visit team:  Tobacco  Allergies  Meds  Problems  Med Hx  Surg Hx  OB Status   Fam Hx  Soc Hx     23 year old female presents with complaints of yellow vaginal discharge.  This has been an ongoing problem.  States she took medication for BV a month ago.  Unsure if it has returned.  Denies abdominal pain, pelvic pain, nausea, vomiting, abnormal vaginal bleeding, itching, irritation.  LMP 08/12/2024.  Denies concerns for pregnancy.  Denies concerns for STDs.  Vaginal Discharge The patient's primary symptoms include vaginal discharge. Pertinent negatives include no abdominal pain, diarrhea, dysuria, fever, flank pain, hematuria, nausea, rash, urgency or vomiting.    Review of Systems  Constitutional:  Negative for fever.  Gastrointestinal:  Negative for abdominal pain, diarrhea, nausea and vomiting.  Genitourinary:  Positive for vaginal discharge. Negative for dysuria, flank pain, genital sores, hematuria, urgency and vaginal bleeding.  Skin:  Negative for rash.    Objective Physical Exam Vitals and nursing note reviewed.  Constitutional:      Appearance: Normal appearance.  Cardiovascular:     Rate and Rhythm: Normal rate and regular rhythm.     Pulses: Normal pulses.          Radial pulses are 2+ on the right side and 2+ on the left side.     Heart sounds: Normal heart sounds.  Pulmonary:     Effort: Pulmonary effort is normal.     Breath sounds: Normal breath sounds.  Abdominal:     General: Bowel sounds are normal. There is no distension.     Palpations: Abdomen is soft.     Tenderness: There is no abdominal tenderness. There is  no right CVA tenderness, left CVA tenderness, guarding or rebound.  Genitourinary:    Comments: Declines. Skin:    General: Skin is warm and dry.     Capillary Refill: Capillary refill takes less than 2 seconds.  Neurological:     Mental Status: She is alert and oriented to person, place, and time.  Psychiatric:        Behavior: Behavior is cooperative.     Assessment/Plan Diagnoses and all orders for this visit:  Vaginal discharge -     Chlamydia / Gonococcus (GC), NAAT -     Bacterial Vaginosis (BV), Candida, and Trichomonas via NAA (Swab)  Other orders -     EPINEPHrine  (EPIPEN ) 0.3 mg/0.3 mL injection syringe; Inject 0.3 mg into the thigh. -     ergocalciferol  (VITAMIN D2) 1,250 mcg (50,000 unit) capsule; Take 50,000 Units by mouth once a week. -     methIMAzole  (TAPAZOLE ) 10 mg tablet; Take 10 mg by mouth.   Patient presents ambulatory, A&O, VSS, NAD. Patient opted to self swab, GU exam declined today. Gonorrhea/chlamydia and BV swabs pending.  Will await results prior to treatment. Recommend increasing fluid intake. Discussed she will be notified of any abnormal results and appropriate treatment will be started at that time. Strict return precautions discussed. Recommend follow up with OBGYN. Patient agreeable without further questions or concerns.   Urgent Care Disposition:  Follow  up with PCP / [facility]NA  Electronically signed: Alan Dorothyann Gaudier, NP 08/28/2024  4:24 PM

## 2024-09-02 ENCOUNTER — Ambulatory Visit: Attending: *Deleted | Admitting: *Deleted

## 2024-09-02 ENCOUNTER — Encounter: Payer: Self-pay | Admitting: *Deleted

## 2024-09-02 VITALS — BP 116/73 | HR 62 | Temp 97.9°F | Ht 62.0 in | Wt 184.4 lb

## 2024-09-02 DIAGNOSIS — M542 Cervicalgia: Secondary | ICD-10-CM

## 2024-09-02 DIAGNOSIS — M549 Dorsalgia, unspecified: Secondary | ICD-10-CM

## 2024-09-02 DIAGNOSIS — G8929 Other chronic pain: Secondary | ICD-10-CM

## 2024-09-02 NOTE — Patient Instructions (Signed)
 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 You have tried conservative treatment including different bras, exercise, weight loss, treatment for recurring fungal infections under the breasts. We are referring you to plastic surgeon for further evaluation

## 2024-09-02 NOTE — Progress Notes (Unsigned)
 Patient ID: Alexis Conley, female    DOB: 2001/01/05  MRN: [id number]  CC: Back Pain (Back and neck pain/Discuss breast reduction)   Subjective: Alexis Conley is a 23 y.o. 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 and psychological effects (shame) She has tried new bras, exercise, weight loss, treatment for intertrigo under her breasts    Patient Active Problem List   Diagnosis Date Noted   Contraception management 01/06/2024   Chlamydia 04/18/2023   Abnormal uterine bleeding (AUB) 02/06/2023   Acute pericarditis    Acute respiratory failure with hypoxia (HCC) 01/09/2022   Chest pain 01/09/2022   Elevated troponin 01/09/2022   Hypokalemia 01/09/2022   Hypertension 01/09/2022   Elevated liver enzymes 01/09/2022   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   Preeclampsia in postpartum period 01/09/2022   Acute diastolic heart failure (HCC)    Pericardial effusion    Gestational hypertension 01/03/2022   PROM (premature rupture of membranes) 01/02/2022   Alpha thalassemia silent carrier 09/04/2021   TB lung, latent 08/01/2021   Supervision of high risk pregnancy, antepartum 06/13/2021     Medications Ordered Prior to Encounter[1]  Allergies[2]  Social History   Socioeconomic History   Marital status: Single    Spouse name: Not on file   Number of children: Not on file   Years of education: Not on file   Highest education level: Not on file  Occupational History   Not on file  Tobacco Use   Smoking status: Never   Smokeless tobacco: Never  Vaping Use   Vaping status: Never Used  Substance and Sexual Activity   Alcohol use: Not Currently     Comment: occ   Drug use: No   Sexual activity: Not Currently    Birth control/protection: None  Other Topics Concern   Not on file  Social History Narrative   Not on file   Social Drivers of Health   Tobacco Use: Low Risk (09/02/2024)   Patient History    Smoking Tobacco Use: Never    Smokeless Tobacco Use: Never    Passive Exposure: Not on file  Financial Resource Strain: Not on file  Food Insecurity: No Food Insecurity (04/14/2024)   Epic    Worried About Programme Researcher, Broadcasting/film/video in the Last Year: Never true    Ran Out of Food in the Last Year: Never true  Transportation Needs: No Transportation Needs (04/14/2024)   Epic    Lack of Transportation (Medical): No    Lack of Transportation (Non-Medical): No  Physical Activity: Not on file  Stress: Not on file  Social Connections: Not on file  Intimate Partner Violence: Not on file  Depression (PHQ2-9): Low Risk (06/08/2024)   Depression (PHQ2-9)    PHQ-2 Score: 4  Recent Concern: Depression (PHQ2-9) - Medium Risk (04/14/2024)   Depression (PHQ2-9)    PHQ-2 Score: 6  Alcohol Screen: Not on file  Housing: Not on file  Utilities: Not on file  Health Literacy: Not on file    Family History  Problem Relation Age of Onset   Obesity Mother    Sleep apnea Mother    Healthy Father     Past Surgical History:  Procedure Laterality Date   NO PAST SURGERIES     WISDOM TOOTH EXTRACTION Bilateral 10/24/2023    ROS: Review of Systems Negative except as stated above  PHYSICAL EXAM: BP 116/73   Pulse 62   Temp 97.9 F (36.6 C) (Oral)   Ht 5' 2 (1.575 m)   Wt 184 lb 6.4 oz (83.6 kg)   SpO2 99%   BMI 33.73 kg/m   Physical Exam Vitals and nursing note reviewed.  Constitutional:      Appearance: Normal appearance.  HENT:     Head: Normocephalic and atraumatic.     Right Ear: External ear normal.     Left Ear: External ear normal.     Nose: Nose normal.     Mouth/Throat:     Mouth: Mucous membranes are moist.      Pharynx: Oropharynx is clear.  Eyes:     Conjunctiva/sclera: Conjunctivae normal.  Cardiovascular:     Rate and Rhythm: Normal rate and regular rhythm.  Pulmonary:     Effort: Pulmonary effort is normal.     Breath sounds: Normal breath sounds.  Chest:  Breasts:    Right: Normal.     Left: Normal.     Comments: Breasts are pendulous Abdominal:     General: There is no distension.  Skin:    General: Skin is warm and dry.     Coloration: Skin is not jaundiced.     Comments:  no intertrigo under the breast  Neurological:     Mental Status: She is alert and oriented to person, place, and time. Mental status is at baseline.     Gait: Gait normal.          Latest Ref Rng & Units 06/08/2024   10:40 AM 05/19/2024   11:12 PM 02/24/2024    3:05 PM  CMP  Glucose 70 - 99 mg/dL 74  89  82   BUN 6 - 20 mg/dL 11  16  15    Creatinine 0.57 - 1.00 mg/dL 9.33  9.19  9.42   Sodium 134 - 144 mmol/L 140  137  140   Potassium 3.5 - 5.2 mmol/L 4.5  4.0  4.8   Chloride 96 - 106 mmol/L 104  106  105   CO2 20 - 29 mmol/L 19  21  19    Calcium 8.7 - 10.2 mg/dL 9.2  9.0  9.4   Total Protein 6.5 - 8.1 g/dL  7.1  7.1   Total Bilirubin 0.0 - 1.2 mg/dL  0.6  <9.7   Alkaline Phos 38 - 126 U/L  82  103   AST 15 - 41 U/L  20  21   ALT 0 - 44 U/L  17  16    Lipid Panel     Component Value Date/Time   CHOL 163 06/08/2024 1040   TRIG 54 06/08/2024 1040   HDL 44 06/08/2024 1040   CHOLHDL 3.7 06/08/2024 1040   LDLCALC 108 (H) 06/08/2024 1040    CBC    Component Value Date/Time   WBC 5.7 05/19/2024 2312   RBC 5.10 05/19/2024 2312   HGB 13.2 05/19/2024 2312   HGB 12.7 02/24/2024 1505   HCT 41.8 05/19/2024 2312   HCT 40.9 02/24/2024 1505   PLT 279 05/19/2024 2312   PLT 270 02/24/2024 1505   MCV 82.0 05/19/2024 2312   MCV 83 02/24/2024 1505   MCH 25.9 (L) 05/19/2024 2312   MCHC 31.6 05/19/2024 2312   RDW 14.2  05/19/2024 2312   RDW 14.4 02/24/2024 1505   LYMPHSABS 2.7 02/24/2024 1505    MONOABS 0.5 02/02/2022 1448   EOSABS 0.1 02/24/2024 1505   BASOSABS 0.0 02/24/2024 1505    Results for orders placed or performed in visit on 07/28/24  Cervicovaginal ancillary only   Collection Time: 07/28/24 10:11 AM  Result Value Ref Range   Neisseria Gonorrhea Negative    Chlamydia Negative    Trichomonas Negative    Bacterial Vaginitis (gardnerella) Positive (A)    Candida Vaginitis Negative    Candida Glabrata Negative    Comment      Normal Reference Range Bacterial Vaginosis - Negative   Comment Normal Reference Range Candida Species - Negative    Comment Normal Reference Range Candida Galbrata - Negative    Comment Normal Reference Range Trichomonas - Negative    Comment Normal Reference Ranger Chlamydia - Negative    Comment      Normal Reference Range Neisseria Gonorrhea - Negative     ASSESSMENT AND PLAN:  Assessment & Plan Chronic neck and back pain Related to pendulous breasts. Interested in eval for breast reduction Orders:   Ambulatory referral to Plastic Surgery       Patient was given the opportunity to ask questions.  Patient verbalized understanding of the plan and was able to repeat key elements of the plan.   This documentation was completed using Paediatric nurse.  Any transcriptional errors are unintentional.     Requested Prescriptions    No prescriptions requested or ordered in this encounter    Return if symptoms worsen or fail to improve.  Jasper Hanf H, NP      [1]  Current Outpatient Medications on File Prior to Visit  Medication Sig Dispense Refill   methimazole  (TAPAZOLE ) 10 MG tablet Take 1 tablet (10 mg total) by mouth 2 (two) times daily. 60 tablet 1   Vitamin D , Ergocalciferol , (DRISDOL ) 1.25 MG (50000 UNIT) CAPS capsule Take 1 capsule (50,000 Units total) by mouth every 7 (seven) days. 5 capsule 1   [DISCONTINUED] fluticasone  (FLONASE ) 50 MCG/ACT nasal spray Place 1-2 sprays into both nostrils daily.  16 g 0   No current facility-administered medications on file prior to visit.  [2]  Allergies Allergen Reactions   Peanut-Containing Drug Products Anaphylaxis   Shellfish Allergy Anaphylaxis   Mushroom Extract Complex (Obsolete)     Childhood allergy.... Does not know reaction.

## 2024-09-06 ENCOUNTER — Encounter (INDEPENDENT_AMBULATORY_CARE_PROVIDER_SITE_OTHER): Payer: Self-pay | Admitting: Nurse Practitioner

## 2024-09-06 ENCOUNTER — Ambulatory Visit (INDEPENDENT_AMBULATORY_CARE_PROVIDER_SITE_OTHER): Admitting: Nurse Practitioner

## 2024-09-06 VITALS — BP 112/73 | HR 100 | Temp 97.8°F | Ht 61.0 in | Wt 179.0 lb

## 2024-09-06 DIAGNOSIS — E7800 Pure hypercholesterolemia, unspecified: Secondary | ICD-10-CM

## 2024-09-06 DIAGNOSIS — E0590 Thyrotoxicosis, unspecified without thyrotoxic crisis or storm: Secondary | ICD-10-CM | POA: Diagnosis not present

## 2024-09-06 DIAGNOSIS — E559 Vitamin D deficiency, unspecified: Secondary | ICD-10-CM | POA: Diagnosis not present

## 2024-09-06 DIAGNOSIS — E66811 Obesity, class 1: Secondary | ICD-10-CM | POA: Diagnosis not present

## 2024-09-06 DIAGNOSIS — Z6834 Body mass index (BMI) 34.0-34.9, adult: Secondary | ICD-10-CM | POA: Diagnosis not present

## 2024-09-06 MED ORDER — VITAMIN D (ERGOCALCIFEROL) 1.25 MG (50000 UNIT) PO CAPS: 50000.0000 [IU] | ORAL_CAPSULE | ORAL | 1 refills | Status: AC

## 2024-09-06 NOTE — Progress Notes (Signed)
 Office: [phone]  /  Fax: [phone]  WEIGHT SUMMARY AND BIOMETRICS  Weight Lost Since Last Visit: 0 lb  Weight Gained Since Last Visit: 1 lb   Vitals Temp: 97.8 F (36.6 C) BP: 112/73 Pulse Rate: 100 SpO2: 98 %   Anthropometric Measurements Height: 5' 1 (1.549 m) Weight: 179 lb (81.2 kg) BMI (Calculated): 33.84 Weight at Last Visit: 178 lb Weight Lost Since Last Visit: 0 lb Weight Gained Since Last Visit: 1 lb Starting Weight: 183 lb Total Weight Loss (lbs): 4 lb (1.814 kg) Peak Weight: 186 lb   Body Composition  Body Fat %: 41.2 % Fat Mass (lbs): 74 lbs Muscle Mass (lbs): 100 lbs Total Body Water (lbs): 74.2 lbs Visceral Fat Rating : 7   Other Clinical Data Fasting: no Labs: no Today's Visit #: 4 Starting Date: 06/08/24     HPI  Chief Complaint: OBESITY  Samona is here to discuss her progress with her obesity treatment plan. She is on the the Category 1 Plan and the Category 2 Plan and states she is following her eating plan approximately 0 % of the time. She states she is not currently exercising.   Interval History:  It has been 2 months since last office visit.  Lots of stress with work and family. Her sister was in a car accident and is in the hospital.  She has continued have stress with her job. She did get to travel to [facility] for 5 days.  Has not been on her plan and has not done exercise.  She has not been eating on plan.  Her goal is to lose weight over the holidays.  She does want a breast reduction and is being referred to plastic surgery.   Danaly does have hyperthyroidism and is currently on Tapazole  10 mg BID and follows with her PCP. Lab Results  Component Value Date   TSH 0.299 (L) 06/22/2024   T3TOTAL 144 03/25/2024   T4TOTAL 8.1 06/22/2024   FREET4 1.18 03/25/2024    She also take Ergocalciferol  50000 units once a week for Vit D deficiency and denies side effects. Last vitamin D  Lab Results  Component Value Date    VD25OH 20.6 (L) 06/22/2024   She also has hyperlipidemia and is trying to work on nutrition, exercise and weight loss to help manage Lab Results  Component Value Date   CHOL 163 06/08/2024   HDL 44 06/08/2024   LDLCALC 108 (H) 06/08/2024   TRIG 54 06/08/2024   CHOLHDL 3.7 06/08/2024     PHYSICAL EXAM:  Blood pressure 112/73, pulse 100, temperature 97.8 F (36.6 C), height 5' 1 (1.549 m), weight 179 lb (81.2 kg), last menstrual period 08/13/2024, SpO2 98%. Body mass index is 33.82 kg/m.  General: Well Developed, well nourished, and in no acute distress.  HEENT: Normocephalic, atraumatic; EOMI, sclerae are anicteric. Skin: Warm and dry, good turgor Chest:  Normal excursion, shape, no gross ABN Respiratory: No conversational dyspnea; speaking in full sentences NeuroM-Sk:  Normal gross ROM * 4 extremities  Psych: A and O X 3, insight adequate, mood- full    DIAGNOSTIC DATA REVIEWED:  BMET    Component Value Date/Time   NA 140 06/08/2024 1040   K 4.5 06/08/2024 1040   CL 104 06/08/2024 1040   CO2 19 (L) 06/08/2024 1040   GLUCOSE 74 06/08/2024 1040   GLUCOSE 89 05/19/2024 2312   BUN 11 06/08/2024 1040   CREATININE 0.66 06/08/2024 1040   CALCIUM 9.2 06/08/2024 1040  GFRNONAA >60 05/19/2024 2312   GFRAA >60 03/30/2020 1809   Lab Results  Component Value Date   HGBA1C 5.3 02/24/2024   HGBA1C 5.2 07/04/2021   Lab Results  Component Value Date   INSULIN  6.4 06/08/2024   Lab Results  Component Value Date   TSH 0.299 (L) 06/22/2024   CBC    Component Value Date/Time   WBC 5.7 05/19/2024 2312   RBC 5.10 05/19/2024 2312   HGB 13.2 05/19/2024 2312   HGB 12.7 02/24/2024 1505   HCT 41.8 05/19/2024 2312   HCT 40.9 02/24/2024 1505   PLT 279 05/19/2024 2312   PLT 270 02/24/2024 1505   MCV 82.0 05/19/2024 2312   MCV 83 02/24/2024 1505   MCH 25.9 (L) 05/19/2024 2312   MCHC 31.6 05/19/2024 2312   RDW 14.2 05/19/2024 2312   RDW 14.4 02/24/2024 1505   Iron  Studies No results found for: IRON, TIBC, FERRITIN, IRONPCTSAT Lipid Panel     Component Value Date/Time   CHOL 163 06/08/2024 1040   TRIG 54 06/08/2024 1040   HDL 44 06/08/2024 1040   CHOLHDL 3.7 06/08/2024 1040   LDLCALC 108 (H) 06/08/2024 1040   Hepatic Function Panel     Component Value Date/Time   PROT 7.1 05/19/2024 2312   PROT 7.1 02/24/2024 1505   ALBUMIN 3.8 05/19/2024 2312   ALBUMIN 4.3 02/24/2024 1505   AST 20 05/19/2024 2312   ALT 17 05/19/2024 2312   ALKPHOS 82 05/19/2024 2312   BILITOT 0.6 05/19/2024 2312   BILITOT <0.2 02/24/2024 1505   BILIDIR <0.1 01/09/2022 0035   IBILI NOT CALCULATED 01/09/2022 0035      Component Value Date/Time   TSH 0.299 (L) 06/22/2024 1449   Nutritional Lab Results  Component Value Date   VD25OH 20.6 (L) 06/22/2024     ASSESSMENT AND PLAN  Class 1 obesity with serious comorbidity and body mass index (BMI) of 34.0 to 34.9 in adult, unspecified obesity type  TREATMENT PLAN FOR OBESITY:  Recommended Dietary Goals  Akelia is currently in the action stage of change. As such, her goal is to continue weight management plan. She has agreed to the Category 1 Plan.  Behavioral Intervention  We discussed the following Behavioral Modification Strategies today: increasing fiber rich foods, avoiding skipping meals, increasing water intake , work on meal planning and preparation, keeping healthy foods at home, decreasing eating out or consumption of processed foods, and making healthy choices when eating convenient foods, avoiding temptations and identifying enticing environmental cues, better snacking choices, celebration eating strategies, continue to work on maintaining a reduced calorie state, getting the recommended amount of protein, incorporating whole foods, making healthy choices, staying well hydrated and practicing mindfulness when eating., and increase protein intake, fibrous foods (25 grams per day for women, 30 grams  for men) and water to improve satiety and decrease hunger signals. .  She wants to lose weight in December - She does  recognize that she must follow a structured plan and will eat before social situation to meet her protein goals and minimze party foods - She will give away food gifts unless they are very low calories or high protein - She will avoid calorie-containing liquids such as holiday drinks, eggnog and alcohol -She will stick to her structured plan strictly most of the time - This strategy should help her lose 1-2 pounds in December   Recommended Physical Activity Goals  Jenayah has been advised to work up to 150 minutes of  moderate intensity aerobic activity a week and strengthening exercises 2-3 times per week for cardiovascular health, weight loss maintenance and preservation of muscle mass.   She has agreed to Think about enjoyable ways to increase daily physical activity and overcoming barriers to exercise and Increase physical activity in their day and reduce sedentary time (increase NEAT).   Pharmacotherapy We discussed various medication options to help Livianna with her weight loss efforts and we both agreed to continue Ergocalciferol  50000 units once a week for Vit D deficiency- denies side effects.  ASSOCIATED CONDITIONS ADDRESSED TODAY  Action/Plan  Pure hypercholesterolemia Focus on implementing category 1 meal plan, limit saturated fats Loss of 10-15% body weight can improve lipid levels Continue to follow regularly with PCP  Hyperthyroidism       Continue Tapazole  and follow with PCP regularly  Vitamin D  deficiency Supplement with Ergocalciferol  50000 units once a week  Low vitamin D  levels can be associated with adiposity and may result in leptin resistance and weight gain. Also associated with fatigue.  Currently on vitamin D  supplementation without any adverse effects such as nausea, vomiting or muscle weakness.     -     Vitamin D  (Ergocalciferol ); Take 1  capsule (50,000 Units total) by mouth every 7 (seven) days.  Dispense: 5 capsule; Refill: 1          Return in about 4 weeks (around 10/04/2024).Alexis Conley She was informed of the importance of frequent follow up visits to maximize her success with intensive lifestyle modifications for her multiple health conditions.   ATTESTASTION STATEMENTS:  Reviewed by clinician on day of visit: allergies, medications, problem list, medical history, surgical history, family history, social history, and previous encounter notes.   Chanika Byland ANP-C

## 2024-09-27 ENCOUNTER — Ambulatory Visit (INDEPENDENT_AMBULATORY_CARE_PROVIDER_SITE_OTHER): Admitting: Plastic Surgery

## 2024-09-27 ENCOUNTER — Telehealth (HOSPITAL_BASED_OUTPATIENT_CLINIC_OR_DEPARTMENT_OTHER): Payer: Self-pay | Admitting: *Deleted

## 2024-09-27 VITALS — BP 130/84 | HR 69 | Ht 62.0 in | Wt 183.0 lb

## 2024-09-27 DIAGNOSIS — R21 Rash and other nonspecific skin eruption: Secondary | ICD-10-CM

## 2024-09-27 DIAGNOSIS — M546 Pain in thoracic spine: Secondary | ICD-10-CM | POA: Diagnosis not present

## 2024-09-27 DIAGNOSIS — N62 Hypertrophy of breast: Secondary | ICD-10-CM

## 2024-09-27 DIAGNOSIS — M542 Cervicalgia: Secondary | ICD-10-CM | POA: Diagnosis not present

## 2024-09-27 NOTE — Telephone Encounter (Signed)
 Vm full, cannot leave message. Pt needs to call the office and schedule an appt. Pt last seen 06/2022. Call [phone] and schedule in office appt for preop clearance.   I will update the surgeon as well as FYI.

## 2024-09-27 NOTE — Progress Notes (Signed)
 "   Referring Provider Placey, Ronal Caldron, NP [address] [place],  [place] [address]   CC:  Chief Complaint  Patient presents with   Advice Only      Alexis Conley is an 24 y.o. female.  HPI: Alexis Conley is a 24 year old female who presents today for complaints of upper back and neck pain which she feels are due to the large size of her breast.  Her breast grew significantly during her pregnancy 2 years ago and she has had increased difficulty with neck and back pain as well as rashes underneath her breast since that time.  She denies any other specific breast complaints.  She does have a history of congestive heart failure associated with her pregnancy.  She is interested in a bilateral breast reduction.  She does not smoke is not diabetic and has no history of DVT.  She does not use blood thinners.  Allergies[1]  Outpatient Encounter Medications as of 09/27/2024  Medication Sig   methimazole  (TAPAZOLE ) 10 MG tablet Take 1 tablet (10 mg total) by mouth 2 (two) times daily.   Vitamin D , Ergocalciferol , (DRISDOL ) 1.25 MG (50000 UNIT) CAPS capsule Take 1 capsule (50,000 Units total) by mouth every 7 (seven) days.   [DISCONTINUED] fluticasone  (FLONASE ) 50 MCG/ACT nasal spray Place 1-2 sprays into both nostrils daily.   No facility-administered encounter medications on file as of 09/27/2024.     Past Medical History:  Diagnosis Date   CHF (congestive heart failure) (HCC)    Congestive heart failure (CHF) (HCC)    Eczema    Heart murmur    When younger   Hyperthyroidism    TB lung, latent     Past Surgical History:  Procedure Laterality Date   NO PAST SURGERIES     WISDOM TOOTH EXTRACTION Bilateral 10/24/2023    Family History  Problem Relation Age of Onset   Obesity Mother    Sleep apnea Mother    Healthy Father     Social History   Social History Narrative   Not on file     Review of Systems General: Denies fevers, chills, weight loss CV: Denies chest  pain, shortness of breath, palpitations Breast: Large breasts which she feels are contributing to her upper back and neck pain  Physical Exam    09/27/2024   10:14 AM 09/06/2024    2:00 PM 09/02/2024    3:19 PM  Vitals with BMI  Height 5' 2 5' 1 5' 2  Weight 183 lbs 179 lbs 184 lbs 6 oz  BMI 33.46 33.84 33.72  Systolic 130 112 883  Diastolic 84 73 73  Pulse 69 100 62    General:  No acute distress,  Alert and oriented, Non-Toxic, Normal speech and affect Breast: Patient has a large pendulous breast with grade 3 ptosis.  There are no dominant masses on physical examination.  The nipples are normal in appearance without evidence of nipple discharge.  Sternal notch to nipple distance on the right is 32 cm and 32 cm on the left the nipple to fold distance on the right is 19 cm and 20 cm on the left. Mammogram: Not applicable due to age Assessment/Plan Symptomatic macromastia: Patient has large breasts bilaterally and I believe that she would benefit from a bilateral breast reduction.  I can remove between 500 g and 550 g per breast.  I discussed breast reductions at length with the patient.  I showed her the location of the  incisions we discussed the unpredictable nature of scarring and wound healing.  We discussed the risk of bleeding, infection, and seroma formation.  She understands I will use drains postoperatively.  She will need a supportive compressive garment for 6 weeks.  We discussed the risk of nipple loss due to nipple ischemia.  We discussed the risk of DVT and the importance of early ambulation after surgery.  Postoperative limitations include no heavy lifting greater than 20 pounds, no vigorous activity, no submerging the incisions in water for 6 weeks.  She may return to light activity as tolerated.  Will need cardiac clearance though she has no residual effects from the congestive heart failure pregnancy.  All questions were answered to her satisfaction.  Photographs were  obtained today with her consent.  Will submit her for bilateral breast reduction at her request.  Alexis Conley 09/27/2024, 1:05 PM         [1]  Allergies Allergen Reactions   Peanut-Containing Drug Products Anaphylaxis   Shellfish Allergy Anaphylaxis   Mushroom Extract Complex (Obsolete)     Childhood allergy.... Does not know reaction.    "

## 2024-09-27 NOTE — Telephone Encounter (Signed)
" ° °  Name: Alexis Conley  DOB: [birth year]  MRN: [id number]  Primary Cardiologist: Jerel Balding, MD  Chart reviewed as part of pre-operative protocol coverage. Because of Alexis Conley's past medical history and time since last visit, she will require a follow-up in-office visit in order to better assess preoperative cardiovascular risk.  Pre-op covering staff: - Please schedule appointment and call patient to inform them. If patient already had an upcoming appointment within acceptable timeframe, please add pre-op clearance to the appointment notes so provider is aware. - Please contact requesting surgeon's office via preferred method (i.e, phone, fax) to inform them of need for appointment prior to surgery.   Astou Lada D Ronneisha Jett, NP  09/27/2024, 5:01 PM   "

## 2024-09-27 NOTE — Telephone Encounter (Signed)
"  ° °  Pre-operative Risk Assessment    Patient Name: Alexis Conley  DOB: 07-21-2001 MRN: [id number]   Date of last office visit: 07/01/22 [facility] MONGE, NP Date of next office visit: NONE   NOTES ON FORM STATE: GENERAL CARDIAC CLEARANCE DUE TO H/O ACUTE HEART DIASTOLIC FAILURE POST PARTUM Request for Surgical Clearance    Procedure:  BREAST REDUCTION  Date of Surgery:  Clearance TBD                                Surgeon:  DR. JACKSON TAYLOR Surgeon's Group or Practice Name:  [facility] Phone number:  [phone] Fax number:  [phone]   Type of Clearance Requested:   - Medical    Type of Anesthesia:  General    Additional requests/questions:    [name]   09/27/2024, 4:47 PM   "

## 2024-09-28 NOTE — Telephone Encounter (Signed)
 Pt was advised she would be cleared for her procedure at her upcoming appt on 1/13.

## 2024-10-04 ENCOUNTER — Encounter: Payer: Self-pay | Admitting: *Deleted

## 2024-10-05 ENCOUNTER — Ambulatory Visit: Attending: Emergency Medicine | Admitting: Emergency Medicine

## 2024-10-05 ENCOUNTER — Encounter: Payer: Self-pay | Admitting: Emergency Medicine

## 2024-10-05 VITALS — BP 100/60 | HR 86 | Ht 61.0 in | Wt 184.0 lb

## 2024-10-05 DIAGNOSIS — I5031 Acute diastolic (congestive) heart failure: Secondary | ICD-10-CM | POA: Insufficient documentation

## 2024-10-05 DIAGNOSIS — R002 Palpitations: Secondary | ICD-10-CM | POA: Diagnosis not present

## 2024-10-05 DIAGNOSIS — I10 Essential (primary) hypertension: Secondary | ICD-10-CM | POA: Diagnosis not present

## 2024-10-05 DIAGNOSIS — Z01810 Encounter for preprocedural cardiovascular examination: Secondary | ICD-10-CM | POA: Insufficient documentation

## 2024-10-05 DIAGNOSIS — Z8679 Personal history of other diseases of the circulatory system: Secondary | ICD-10-CM | POA: Diagnosis not present

## 2024-10-05 NOTE — Patient Instructions (Signed)
 Medication Instructions:  NO CHANGES  Lab Work: NONE TO BE DONE TODAY.  Testing/Procedures: NONE  Follow-Up: At [facility], you and your health needs are our priority.  As part of our continuing mission to provide you with exceptional heart care, our providers are all part of one team.  This team includes your primary Cardiologist (physician) and Advanced Practice Providers or APPs (Physician Assistants and Nurse Practitioners) who all work together to provide you with the care you need, when you need it.  Your next appointment:   1 YEAR  Provider:   Jerel Balding, MD OR Lum Louis, NP    Other Instructions:

## 2024-10-05 NOTE — Progress Notes (Signed)
 " Cardiology Office Note:    Date:  10/05/2024  ID:  Rock Alexis Conley, DOB 2000-10-05, MRN [id number] PCP: Alexis Clam, MD  [facility] Providers Cardiologist:  Jerel Balding, MD       Patient Profile:       Chief Complaint: Preoperative clearance History of Present Illness:  Alexis Conley is a 24 y.o. female with visit-pertinent history of acute diastolic heart failure, diastolic hypertension, postpartum preeclampsia  She has history of severe diastolic hypertension 1 week following vaginal delivery of her first pregnancy.  She was treated with nifedipine  and loop paramedics.  There was initially concern for pericarditis, treated conservatively given postpartum state.  Additionally her pericarditic syndrome was thought to be secondary to acute viral illness.  Echocardiogram in 01/2022 showed LVEF 55 to 60%, no RWMA, no LVH, normal RV function, no significant valvular abnormalities.  Was seen in office on 03/01/2022 and was stable from a cardiac standpoint.  Follow-up inflammatory markers were not completed at that time.  Last seen in clinic on 07/01/2022.  Patient noted ongoing chest discomfort that occurs at night and intermittent palpitations.  Underwent echocardiogram on 07/16/2022 showing LVEF of 60 to 65%, no RWMA, normal diastolic parameters, RV function normal, no valvular abnormalities.  Zio 06/2022 showed dominant rhythm with sinus rhythm with very rare ectopic atrial and ectopic ventricular beats.  No A-fib, VT, bradycardia, or complex arrhythmia.  Discussed the use of AI scribe software for clinical note transcription with the patient, who gave verbal consent to proceed.  History of Present Illness Alexis Conley is a 24 year old female with pericarditis, diastolic heart failure, and hypertension who presents for pre-operative clearance for breast reduction surgery.  Today she is doing well without acute complaints.  She has been stable since her last visit with no recent  chest pain or dyspnea.  She reports her blood pressure has been under excellent control.  She has recently been diagnosed with hyperthyroidism managed on methimazole .  She works as an ambulance person at [employer].  She denies chest pain or dyspnea.  She is without orthopnea, PND, LEE, syncope, presyncope, lightheadedness, dizziness, palpitations.  Review of systems:  Please see the history of present illness. All other systems are reviewed and otherwise negative.      Studies Reviewed:    EKG Interpretation Date/Time:  Tuesday October 05 2024 08:04:29 EST Ventricular Rate:  86 PR Interval:  148 QRS Duration:  76 QT Interval:  364 QTC Calculation: 435 R Axis:   33  Text Interpretation: Normal sinus rhythm Normal ECG When compared with ECG of 25-Mar-2023 08:20, No significant change was found Confirmed by Rana Dixon [phone]) on 10/05/2024 2:51:13 PM   ZIO 07/01/2022   Dominant rhythm was normal sinus rhythm with normal circadian variation.   There are very rare ectopic atrial and ectopic ventricular beats.  There is no evidence of atrial fibrillation, ventricular tachycardia or other complex arrhythmia.  There is no significant bradycardia.   A few symptom triggered recordings were submitted for review and they all show normal rhythm.   Normal rhythm monitor.   Patch Wear Time:  12 days and 5 hours (2023-10-15T19:23:34-0400 to 2023-10-28T01:09:19-0400)   Patient had a min HR of 44 bpm, max HR of 196 bpm, and avg HR of 86 bpm. Predominant underlying rhythm was Sinus Rhythm. Isolated SVEs were rare (<1.0%), and no SVE Couplets or SVE Triplets were present. Isolated VEs were rare (<1.0%), and no VE Couplets  or VE Triplets  were present.   Echocardiogram 07/16/2022 1. Left ventricular ejection fraction, by estimation, is 60 to 65%. The  left ventricle has normal function. The left ventricle has no regional  wall motion abnormalities. Left ventricular diastolic parameters were   normal. The average left ventricular  global longitudinal strain is -28.8 %. The global longitudinal strain is  normal.   2. Right ventricular systolic function is normal. The right ventricular  size is normal.   3. The mitral valve is normal in structure. Trivial mitral valve  regurgitation. No evidence of mitral stenosis.   4. The aortic valve is tricuspid. Aortic valve regurgitation is not  visualized. No aortic stenosis is present.   5. The inferior vena cava is normal in size with greater than 50%  respiratory variability, suggesting right atrial pressure of 3 mmHg.   Echocardiogram 02/20/2022  1. Left ventricular ejection fraction, by estimation, is 55 to 60%. The  left ventricle has normal function. The left ventricle has no regional  wall motion abnormalities. Left ventricular diastolic parameters were  normal.   2. Right ventricular systolic function is normal. The right ventricular  size is normal.   3. The mitral valve is normal in structure. Trivial mitral valve  regurgitation.   4. The aortic valve is tricuspid. Aortic valve regurgitation is not  visualized. No aortic stenosis is present.   5. The inferior vena cava is normal in size with greater than 50%  respiratory variability, suggesting right atrial pressure of 3 mmHg.    Risk Assessment/Calculations:              Physical Exam:   VS:  BP 100/60 (BP Location: Right Arm, Patient Position: Sitting, Cuff Size: Normal)   Pulse 86   Ht 5' 1 (1.549 m)   Wt 184 lb (83.5 kg)   LMP 08/13/2024   BMI 34.77 kg/m    Wt Readings from Last 3 Encounters:  10/05/24 184 lb (83.5 kg)  09/27/24 183 lb (83 kg)  09/06/24 179 lb (81.2 kg)    GEN: Well nourished, well developed in no acute distress NECK: No JVD; No carotid bruits CARDIAC: RRR, no murmurs, rubs, gallops RESPIRATORY:  Clear to auscultation without rales, wheezing or rhonchi  ABDOMEN: Soft, non-tender, non-distended EXTREMITIES:  No edema; No acute  deformity      Assessment and Plan:  Diastolic heart failure Most recent echo 06/2022 with LVEF 60 to 65%, no RWMA, normal diastolic parameters, RV function and size normal - Today patient appears euvolemic - No orthopnea, dyspnea, PND, LEE, or weight gain - She is well compensated  History of pericarditis Diagnosed in 2023 with acute pericarditis No evidence of pericardial effusion on most recent echo 06/2022 - EKG without shows NSR without concerning features - Today patient is stable without chest pains or dyspnea.  Active without exertional symptoms - No signs of reoccurrence.  Continue to monitor  Diastolic hypertension Noted to have severe diastolic hypertension 1 week after vaginal delivery in 2023 - Blood pressure today is well-controlled at 100/60 without antihypertensive medications  Palpitations ZIO 09/2021 was normal with very rare ectopic atrial and ventricular beats with no arrhythmia, blocks or significant bradycardia - Quiescent.  She denies any palpitations today  Hyperthyroidism - Managed on methimazole  per PCP  Preoperative cardiac clearance According to the Revised Cardiac Risk Index (RCRI), her Perioperative Risk of Major Cardiac Event is (%): 0.4. Her Functional Capacity in METs is: 9.89 according to the Duke Activity Status Index (DASI).  Therefore, based  on ACC/AHA guidelines, patient would be at acceptable risk for the planned procedure without further cardiovascular testing. I will route this recommendation to the requesting party via Epic fax function.       Dispo:  Return in about 1 year (around 10/05/2025).  Signed, Lum LITTIE Louis, NP  "

## 2024-10-07 ENCOUNTER — Encounter (INDEPENDENT_AMBULATORY_CARE_PROVIDER_SITE_OTHER): Payer: Self-pay | Admitting: Nurse Practitioner

## 2024-10-07 ENCOUNTER — Ambulatory Visit (INDEPENDENT_AMBULATORY_CARE_PROVIDER_SITE_OTHER): Admitting: Nurse Practitioner

## 2024-10-07 VITALS — BP 114/72 | HR 75 | Temp 97.8°F | Ht 61.0 in | Wt 180.0 lb

## 2024-10-07 DIAGNOSIS — Z6834 Body mass index (BMI) 34.0-34.9, adult: Secondary | ICD-10-CM | POA: Diagnosis not present

## 2024-10-07 DIAGNOSIS — E66811 Obesity, class 1: Secondary | ICD-10-CM | POA: Diagnosis not present

## 2024-10-07 DIAGNOSIS — E0590 Thyrotoxicosis, unspecified without thyrotoxic crisis or storm: Secondary | ICD-10-CM

## 2024-10-07 DIAGNOSIS — E559 Vitamin D deficiency, unspecified: Secondary | ICD-10-CM | POA: Diagnosis not present

## 2024-10-07 NOTE — Progress Notes (Signed)
 " Office: [phone]  /  Fax: [phone]  WEIGHT SUMMARY AND BIOMETRICS  Weight Lost Since Last Visit: 0  Weight Gained Since Last Visit: 1 lb   Vitals Temp: 97.8 F (36.6 C) BP: 114/72 Pulse Rate: 75 SpO2: 100 %   Anthropometric Measurements Height: 5' 1 (1.549 m) Weight: 180 lb (81.6 kg) BMI (Calculated): 34.03 Weight at Last Visit: 179 lb Weight Lost Since Last Visit: 0 Weight Gained Since Last Visit: 1 lb Starting Weight: 183 lb Total Weight Loss (lbs): 3 lb (1.361 kg) Peak Weight: 186 lb   Body Composition  Body Fat %: 41.6 % Fat Mass (lbs): 75.2 lbs Muscle Mass (lbs): 100.2 lbs Total Body Water (lbs): 74 lbs Visceral Fat Rating : 8   Other Clinical Data Fasting: no Labs: no Today's Visit #: 5 Comments: [id number]    Total Weight Loss: 3 pounds( up 1 pound today)  Bio Impedance Data reviewed with patient: Muscle is up 0.2 pounds, adipose is up 1.2 pounds  HPI  Chief Complaint: OBESITY  Alexis Conley is here to discuss her progress with her obesity treatment plan. She is on the the Category 1 Plan and states she is following her eating plan approximately 25 % of the time. She states she is exercising 60 minutes 3 days per week.   Interval History:  Alexis Conley has been getting 80 grams of protein , she has not been skipping any meals. She is getting maybe 12 ounces of water a day. She will set a goal of 32 ounces of water a day. She will have 2 cups of juice a day. She has been eating lots of fruits and vegetables. She has not been eating bread recently. She does eat out but has been trying to cook more at home. She has been snacking more. She had more snacks over the holidays.   She has noticed a difference in her energy level with Ergocalciferol  once a week. Denies side effects with medication.  She continues on methimazole  10 mg BID for hyperthyroidism, still has occasional palpitations and persistent hair loss.  Follows with her PCP  She had recent follow  up appointment with her cardiologist- EKG was WNL. Last echo in 2023 showed LVEF 60-65% normal  PHYSICAL EXAM:  Blood pressure 114/72, pulse 75, temperature 97.8 F (36.6 C), height 5' 1 (1.549 m), weight 180 lb (81.6 kg), last menstrual period 09/08/2024, SpO2 100%. Body mass index is 34.01 kg/m.  General: Well Developed, well nourished, and in no acute distress.  HEENT: Normocephalic, atraumatic; EOMI, sclerae are anicteric. Skin: Warm and dry, good turgor Chest:  Normal excursion, shape, no gross ABN Respiratory: No conversational dyspnea; speaking in full sentences NeuroM-Sk:  Normal gross ROM * 4 extremities  Psych: A and O X 3, insight adequate, mood- full    DIAGNOSTIC DATA REVIEWED:  BMET    Component Value Date/Time   NA 140 06/08/2024 1040   K 4.5 06/08/2024 1040   CL 104 06/08/2024 1040   CO2 19 (L) 06/08/2024 1040   GLUCOSE 74 06/08/2024 1040   GLUCOSE 89 05/19/2024 2312   BUN 11 06/08/2024 1040   CREATININE 0.66 06/08/2024 1040   CALCIUM 9.2 06/08/2024 1040   GFRNONAA >60 05/19/2024 2312   GFRAA >60 03/30/2020 1809   Lab Results  Component Value Date   HGBA1C 5.3 02/24/2024   HGBA1C 5.2 07/04/2021   Lab Results  Component Value Date   INSULIN  6.4 06/08/2024   Lab Results  Component Value Date  TSH 0.299 (L) 06/22/2024   CBC    Component Value Date/Time   WBC 5.7 05/19/2024 2312   RBC 5.10 05/19/2024 2312   HGB 13.2 05/19/2024 2312   HGB 12.7 02/24/2024 1505   HCT 41.8 05/19/2024 2312   HCT 40.9 02/24/2024 1505   PLT 279 05/19/2024 2312   PLT 270 02/24/2024 1505   MCV 82.0 05/19/2024 2312   MCV 83 02/24/2024 1505   MCH 25.9 (L) 05/19/2024 2312   MCHC 31.6 05/19/2024 2312   RDW 14.2 05/19/2024 2312   RDW 14.4 02/24/2024 1505   Iron Studies No results found for: IRON, TIBC, FERRITIN, IRONPCTSAT Lipid Panel     Component Value Date/Time   CHOL 163 06/08/2024 1040   TRIG 54 06/08/2024 1040   HDL 44 06/08/2024 1040   CHOLHDL  3.7 06/08/2024 1040   LDLCALC 108 (H) 06/08/2024 1040   Hepatic Function Panel     Component Value Date/Time   PROT 7.1 05/19/2024 2312   PROT 7.1 02/24/2024 1505   ALBUMIN 3.8 05/19/2024 2312   ALBUMIN 4.3 02/24/2024 1505   AST 20 05/19/2024 2312   ALT 17 05/19/2024 2312   ALKPHOS 82 05/19/2024 2312   BILITOT 0.6 05/19/2024 2312   BILITOT <0.2 02/24/2024 1505   BILIDIR <0.1 01/09/2022 0035   IBILI NOT CALCULATED 01/09/2022 0035      Component Value Date/Time   TSH 0.299 (L) 06/22/2024 1449   Nutritional Lab Results  Component Value Date   VD25OH 20.6 (L) 06/22/2024     ASSESSMENT AND PLAN Class 1 obesity with serious comorbidity and body mass index (BMI) of 34.0 to 34.9 in adult, unspecified obesity type TREATMENT PLAN FOR OBESITY:  Recommended Dietary Goals  Alexis Conley is currently in the action stage of change. As such, her goal is to continue weight management plan. She has agreed to the Category 1 Plan.  Behavioral Intervention  We discussed the following Behavioral Modification Strategies today: increasing lean protein intake to established goals, increasing water intake , continue to work on maintaining a reduced calorie state, getting the recommended amount of protein, incorporating whole foods, making healthy choices, staying well hydrated and practicing mindfulness when eating., and increase protein intake, fibrous foods (25 grams per day for women, 30 grams for men) and water to improve satiety and decrease hunger signals. .    Recommended Physical Activity Goals  Alexis Conley has been advised to work up to 150 minutes of moderate intensity aerobic activity a week and strengthening exercises 2-3 times per week for cardiovascular health, weight loss maintenance and preservation of muscle mass.   She has agreed to Increase physical activity in their day and reduce sedentary time (increase NEAT). and Start aerobic activity with a goal of 150 minutes a week at moderate  intensity.    Pharmacotherapy We discussed various medication options to help Alexis Conley with her weight loss efforts and we both agreed to continue Ergocalciferol  50000 units once a week for Vit D deficiency.  ASSOCIATED CONDITIONS ADDRESSED TODAY  Action/Plan  Vitamin D  deficiency       Supplement with Ergocalciferol  50000 units once a week        Low vitamin D  levels can be associated with adiposity and may result in leptin resistance and weight gain. Also associated with fatigue.        Currently on vitamin D  supplementation without any adverse effects such as nausea, vomiting or muscle weakness.      Hyperthyroidism  Continue to follow regularly with Dr. Newlin       Continue Tapazole  10 mg BID       Continue to monitor symptoms       Return in about 4 weeks (around 11/04/2024).Alexis Conley She was informed of the importance of frequent follow up visits to maximize her success with intensive lifestyle modifications for her multiple health conditions.   ATTESTASTION STATEMENTS:  Reviewed by clinician on day of visit: allergies, medications, problem list, medical history, surgical history, family history, social history, and previous encounter notes.   I personally spent a total of 23 minutes in the care of the patient today including preparing to see the patient, getting/reviewing separately obtained history, performing a medically appropriate exam/evaluation, counseling and educating, and documenting clinical information in the EHR.   Lonell Liverpool ANP-C "

## 2024-10-22 ENCOUNTER — Other Ambulatory Visit: Payer: Self-pay

## 2024-10-22 ENCOUNTER — Ambulatory Visit

## 2024-10-22 ENCOUNTER — Other Ambulatory Visit (HOSPITAL_COMMUNITY)
Admission: RE | Admit: 2024-10-22 | Discharge: 2024-10-22 | Disposition: A | Source: Ambulatory Visit | Attending: Family Medicine | Admitting: Family Medicine

## 2024-10-22 VITALS — BP 134/47 | HR 67 | Wt 187.0 lb

## 2024-10-22 DIAGNOSIS — N898 Other specified noninflammatory disorders of vagina: Secondary | ICD-10-CM | POA: Insufficient documentation

## 2024-10-22 DIAGNOSIS — Z23 Encounter for immunization: Secondary | ICD-10-CM | POA: Diagnosis not present

## 2024-10-22 LAB — CERVICOVAGINAL ANCILLARY ONLY
Bacterial Vaginitis (gardnerella): NEGATIVE
Candida Glabrata: NEGATIVE
Candida Vaginitis: POSITIVE — AB
Chlamydia: NEGATIVE
Comment: NEGATIVE
Comment: NEGATIVE
Comment: NEGATIVE
Comment: NEGATIVE
Comment: NEGATIVE
Comment: NORMAL
Neisseria Gonorrhea: NEGATIVE
Trichomonas: NEGATIVE

## 2024-10-22 NOTE — Progress Notes (Signed)
 [name] here for Gardasil  Injection.  Injection administered without complication. Patient will return in 3 months for third injection. She requests to do self vaginal swab due to increased discharge and mild odor.  She is understanding that staff will contact her for abnormal results and treatment if needed  Pt denies any further questions or concerns at this time.      [place], RN

## 2024-10-24 ENCOUNTER — Ambulatory Visit: Payer: Self-pay | Admitting: Obstetrics and Gynecology

## 2024-10-24 DIAGNOSIS — B3731 Acute candidiasis of vulva and vagina: Secondary | ICD-10-CM

## 2024-10-24 MED ORDER — FLUCONAZOLE 150 MG PO TABS: 150.0000 mg | ORAL_TABLET | Freq: Once | ORAL | 0 refills | Status: AC

## 2024-11-04 ENCOUNTER — Ambulatory Visit (INDEPENDENT_AMBULATORY_CARE_PROVIDER_SITE_OTHER): Admitting: Nurse Practitioner

## 2025-01-20 ENCOUNTER — Ambulatory Visit: Payer: Self-pay
